# Patient Record
Sex: Female | Born: 1935 | Race: Black or African American | Hispanic: No | Smoking: Never smoker
Health system: Southern US, Community
[De-identification: ages and names within clinical notes are randomized; demographics above are authoritative.]

## PROBLEM LIST (undated history)

## (undated) DIAGNOSIS — M1990 Unspecified osteoarthritis, unspecified site: Secondary | ICD-10-CM

## (undated) DIAGNOSIS — M8580 Other specified disorders of bone density and structure, unspecified site: Secondary | ICD-10-CM

## (undated) DIAGNOSIS — Z860100 Personal history of colon polyps, unspecified: Secondary | ICD-10-CM

## (undated) DIAGNOSIS — H5440 Blindness, one eye, unspecified eye: Secondary | ICD-10-CM

## (undated) DIAGNOSIS — N3281 Overactive bladder: Secondary | ICD-10-CM

## (undated) DIAGNOSIS — I872 Venous insufficiency (chronic) (peripheral): Secondary | ICD-10-CM

## (undated) DIAGNOSIS — K449 Diaphragmatic hernia without obstruction or gangrene: Secondary | ICD-10-CM

## (undated) DIAGNOSIS — R0789 Other chest pain: Secondary | ICD-10-CM

## (undated) DIAGNOSIS — L309 Dermatitis, unspecified: Secondary | ICD-10-CM

## (undated) DIAGNOSIS — K589 Irritable bowel syndrome without diarrhea: Secondary | ICD-10-CM

## (undated) DIAGNOSIS — R51 Headache: Secondary | ICD-10-CM

## (undated) DIAGNOSIS — F419 Anxiety disorder, unspecified: Secondary | ICD-10-CM

## (undated) HISTORY — DX: Venous insufficiency (chronic) (peripheral): I87.2

## (undated) HISTORY — DX: Irritable bowel syndrome without diarrhea: K58.9

## (undated) HISTORY — DX: Personal history of colonic polyps: Z86.010

## (undated) HISTORY — DX: Blindness, one eye, unspecified eye: H54.40

## (undated) HISTORY — DX: Dermatitis, unspecified: L30.9

## (undated) HISTORY — DX: Diaphragmatic hernia without obstruction or gangrene: K44.9

## (undated) HISTORY — DX: Anxiety disorder, unspecified: F41.9

## (undated) HISTORY — DX: Other specified disorders of bone density and structure, unspecified site: M85.80

## (undated) HISTORY — DX: Other chest pain: R07.89

## (undated) HISTORY — PX: EYE SURGERY: SHX253

## (undated) HISTORY — DX: Headache: R51

## (undated) HISTORY — PX: VAGINAL HYSTERECTOMY: SUR661

## (undated) HISTORY — DX: Unspecified osteoarthritis, unspecified site: M19.90

## (undated) HISTORY — DX: Overactive bladder: N32.81

## (undated) HISTORY — DX: Personal history of colon polyps, unspecified: Z86.0100

---

## 1998-04-05 ENCOUNTER — Other Ambulatory Visit: Admission: RE | Admit: 1998-04-05 | Discharge: 1998-04-05 | Payer: Self-pay | Admitting: Obstetrics & Gynecology

## 1999-10-17 ENCOUNTER — Encounter: Admission: RE | Admit: 1999-10-17 | Discharge: 1999-10-17 | Payer: Self-pay | Admitting: Pulmonary Disease

## 1999-10-17 ENCOUNTER — Encounter: Payer: Self-pay | Admitting: Pulmonary Disease

## 2000-01-09 ENCOUNTER — Ambulatory Visit (HOSPITAL_COMMUNITY): Admission: RE | Admit: 2000-01-09 | Discharge: 2000-01-09 | Payer: Self-pay | Admitting: Pulmonary Disease

## 2000-01-09 ENCOUNTER — Encounter: Payer: Self-pay | Admitting: Pulmonary Disease

## 2000-04-15 ENCOUNTER — Other Ambulatory Visit: Admission: RE | Admit: 2000-04-15 | Discharge: 2000-04-15 | Payer: Self-pay | Admitting: Obstetrics & Gynecology

## 2000-08-24 ENCOUNTER — Encounter: Payer: Self-pay | Admitting: Internal Medicine

## 2000-08-24 ENCOUNTER — Encounter: Payer: Self-pay | Admitting: Emergency Medicine

## 2000-08-24 ENCOUNTER — Emergency Department (HOSPITAL_COMMUNITY): Admission: EM | Admit: 2000-08-24 | Discharge: 2000-08-24 | Payer: Self-pay | Admitting: *Deleted

## 2001-01-06 ENCOUNTER — Encounter: Payer: Self-pay | Admitting: Pulmonary Disease

## 2001-01-06 ENCOUNTER — Encounter: Admission: RE | Admit: 2001-01-06 | Discharge: 2001-01-06 | Payer: Self-pay | Admitting: Pulmonary Disease

## 2002-04-01 ENCOUNTER — Encounter: Admission: RE | Admit: 2002-04-01 | Discharge: 2002-04-01 | Payer: Self-pay | Admitting: Pulmonary Disease

## 2002-04-01 ENCOUNTER — Encounter: Payer: Self-pay | Admitting: Pulmonary Disease

## 2003-04-05 ENCOUNTER — Encounter: Payer: Self-pay | Admitting: Pulmonary Disease

## 2003-04-05 ENCOUNTER — Encounter: Admission: RE | Admit: 2003-04-05 | Discharge: 2003-04-05 | Payer: Self-pay | Admitting: Pulmonary Disease

## 2003-11-26 ENCOUNTER — Other Ambulatory Visit: Admission: RE | Admit: 2003-11-26 | Discharge: 2003-11-26 | Payer: Self-pay | Admitting: *Deleted

## 2004-06-16 ENCOUNTER — Encounter: Admission: RE | Admit: 2004-06-16 | Discharge: 2004-06-16 | Payer: Self-pay | Admitting: Pulmonary Disease

## 2004-06-23 ENCOUNTER — Encounter: Admission: RE | Admit: 2004-06-23 | Discharge: 2004-06-23 | Payer: Self-pay | Admitting: Pulmonary Disease

## 2004-07-03 ENCOUNTER — Ambulatory Visit: Payer: Self-pay | Admitting: Pulmonary Disease

## 2004-08-07 ENCOUNTER — Ambulatory Visit: Payer: Self-pay | Admitting: Pulmonary Disease

## 2004-08-11 ENCOUNTER — Ambulatory Visit: Payer: Self-pay | Admitting: Internal Medicine

## 2004-11-23 ENCOUNTER — Emergency Department (HOSPITAL_COMMUNITY): Admission: EM | Admit: 2004-11-23 | Discharge: 2004-11-23 | Payer: Self-pay | Admitting: Family Medicine

## 2004-11-27 ENCOUNTER — Ambulatory Visit: Payer: Self-pay | Admitting: Pulmonary Disease

## 2004-11-27 ENCOUNTER — Ambulatory Visit (HOSPITAL_COMMUNITY): Admission: RE | Admit: 2004-11-27 | Discharge: 2004-11-27 | Payer: Self-pay | Admitting: Pulmonary Disease

## 2005-01-08 ENCOUNTER — Ambulatory Visit: Payer: Self-pay | Admitting: Pulmonary Disease

## 2005-02-06 ENCOUNTER — Ambulatory Visit: Payer: Self-pay | Admitting: Pulmonary Disease

## 2005-02-07 ENCOUNTER — Ambulatory Visit: Payer: Self-pay | Admitting: Pulmonary Disease

## 2005-05-07 ENCOUNTER — Ambulatory Visit: Payer: Self-pay | Admitting: Pulmonary Disease

## 2005-05-18 ENCOUNTER — Ambulatory Visit: Payer: Self-pay | Admitting: Pulmonary Disease

## 2005-08-20 ENCOUNTER — Encounter: Admission: RE | Admit: 2005-08-20 | Discharge: 2005-08-20 | Payer: Self-pay | Admitting: Pulmonary Disease

## 2005-09-03 ENCOUNTER — Ambulatory Visit: Payer: Self-pay | Admitting: Pulmonary Disease

## 2005-09-07 ENCOUNTER — Encounter: Admission: RE | Admit: 2005-09-07 | Discharge: 2005-09-07 | Payer: Self-pay | Admitting: Pulmonary Disease

## 2005-11-20 ENCOUNTER — Ambulatory Visit: Payer: Self-pay | Admitting: Pulmonary Disease

## 2006-03-20 ENCOUNTER — Ambulatory Visit: Payer: Self-pay | Admitting: Pulmonary Disease

## 2006-03-26 ENCOUNTER — Ambulatory Visit: Payer: Self-pay | Admitting: Pulmonary Disease

## 2006-05-01 ENCOUNTER — Ambulatory Visit: Payer: Self-pay | Admitting: Pulmonary Disease

## 2006-07-22 ENCOUNTER — Ambulatory Visit: Payer: Self-pay | Admitting: Pulmonary Disease

## 2006-07-25 ENCOUNTER — Ambulatory Visit: Payer: Self-pay | Admitting: Pulmonary Disease

## 2006-07-25 LAB — CONVERTED CEMR LAB
ALT: 18 units/L (ref 0–40)
Albumin: 3.5 g/dL (ref 3.5–5.2)
BUN: 10 mg/dL (ref 6–23)
Basophils Relative: 0.5 % (ref 0.0–1.0)
Calcium: 9.5 mg/dL (ref 8.4–10.5)
Chloride: 101 meq/L (ref 96–112)
Eosinophil percent: 1.2 % (ref 0.0–5.0)
GFR calc non Af Amer: 75 mL/min
Hemoglobin: 13.4 g/dL (ref 12.0–15.0)
Lymphocytes Relative: 42.7 % (ref 12.0–46.0)
MCHC: 33.5 g/dL (ref 30.0–36.0)
MCV: 90.6 fL (ref 78.0–100.0)
Monocytes Relative: 5.6 % (ref 3.0–11.0)
Platelets: 302 10*3/uL (ref 150–400)
Potassium: 3.7 meq/L (ref 3.5–5.1)
RBC: 4.43 M/uL (ref 3.87–5.11)
WBC: 6.3 10*3/uL (ref 4.5–10.5)

## 2006-08-22 ENCOUNTER — Encounter: Admission: RE | Admit: 2006-08-22 | Discharge: 2006-08-22 | Payer: Self-pay | Admitting: Pulmonary Disease

## 2006-09-30 ENCOUNTER — Ambulatory Visit: Payer: Self-pay | Admitting: Pulmonary Disease

## 2006-11-20 ENCOUNTER — Ambulatory Visit: Payer: Self-pay | Admitting: Pulmonary Disease

## 2006-11-20 LAB — CONVERTED CEMR LAB
BUN: 11 mg/dL (ref 6–23)
Calcium: 9.6 mg/dL (ref 8.4–10.5)
Chloride: 105 meq/L (ref 96–112)
GFR calc non Af Amer: 105 mL/min
Sodium: 142 meq/L (ref 135–145)

## 2006-11-27 ENCOUNTER — Ambulatory Visit: Payer: Self-pay | Admitting: Internal Medicine

## 2007-02-20 ENCOUNTER — Ambulatory Visit: Payer: Self-pay | Admitting: Pulmonary Disease

## 2007-02-27 ENCOUNTER — Ambulatory Visit: Payer: Self-pay | Admitting: Pulmonary Disease

## 2007-02-27 LAB — CONVERTED CEMR LAB
CO2: 30 meq/L (ref 19–32)
Chloride: 105 meq/L (ref 96–112)
Cholesterol: 147 mg/dL (ref 0–200)
Creatinine, Ser: 0.7 mg/dL (ref 0.4–1.2)
GFR calc Af Amer: 106 mL/min
Glucose, Bld: 150 mg/dL — ABNORMAL HIGH (ref 70–99)
HDL: 34.8 mg/dL — ABNORMAL LOW (ref >39.0)
Potassium: 3.9 meq/L (ref 3.5–5.1)
Sodium: 140 meq/L (ref 135–145)
Triglycerides: 102 mg/dL (ref 0–149)
VLDL: 20 mg/dL (ref 0–40)

## 2007-05-05 ENCOUNTER — Ambulatory Visit: Payer: Self-pay | Admitting: Pulmonary Disease

## 2007-06-19 DIAGNOSIS — K589 Irritable bowel syndrome without diarrhea: Secondary | ICD-10-CM

## 2007-06-19 DIAGNOSIS — I872 Venous insufficiency (chronic) (peripheral): Secondary | ICD-10-CM | POA: Insufficient documentation

## 2007-06-19 DIAGNOSIS — E118 Type 2 diabetes mellitus with unspecified complications: Secondary | ICD-10-CM

## 2007-06-19 DIAGNOSIS — M1990 Unspecified osteoarthritis, unspecified site: Secondary | ICD-10-CM | POA: Insufficient documentation

## 2007-06-19 DIAGNOSIS — R51 Headache: Secondary | ICD-10-CM

## 2007-06-19 DIAGNOSIS — F411 Generalized anxiety disorder: Secondary | ICD-10-CM

## 2007-06-19 DIAGNOSIS — M545 Low back pain: Secondary | ICD-10-CM

## 2007-06-19 DIAGNOSIS — H5440 Blindness, one eye, unspecified eye: Secondary | ICD-10-CM

## 2007-06-19 DIAGNOSIS — E1169 Type 2 diabetes mellitus with other specified complication: Secondary | ICD-10-CM

## 2007-06-19 DIAGNOSIS — E785 Hyperlipidemia, unspecified: Secondary | ICD-10-CM

## 2007-06-19 DIAGNOSIS — R519 Headache, unspecified: Secondary | ICD-10-CM | POA: Insufficient documentation

## 2007-06-19 DIAGNOSIS — K449 Diaphragmatic hernia without obstruction or gangrene: Secondary | ICD-10-CM | POA: Insufficient documentation

## 2007-06-19 DIAGNOSIS — D126 Benign neoplasm of colon, unspecified: Secondary | ICD-10-CM

## 2007-06-19 HISTORY — DX: Irritable bowel syndrome, unspecified: K58.9

## 2007-06-19 HISTORY — DX: Type 2 diabetes mellitus with unspecified complications: E11.8

## 2007-06-19 HISTORY — DX: Generalized anxiety disorder: F41.1

## 2007-06-19 HISTORY — DX: Blindness, one eye, unspecified eye: H54.40

## 2007-06-19 HISTORY — DX: Type 2 diabetes mellitus with other specified complication: E11.69

## 2007-06-20 ENCOUNTER — Ambulatory Visit: Payer: Self-pay | Admitting: Pulmonary Disease

## 2007-06-20 LAB — CONVERTED CEMR LAB
CO2: 33 meq/L — ABNORMAL HIGH (ref 19–32)
Creatinine, Ser: 0.7 mg/dL (ref 0.4–1.2)
GFR calc Af Amer: 106 mL/min
GFR calc non Af Amer: 88 mL/min
Glucose, Bld: 101 mg/dL — ABNORMAL HIGH (ref 70–99)
Potassium: 3.7 meq/L (ref 3.5–5.1)
Sodium: 140 meq/L (ref 135–145)

## 2007-09-11 ENCOUNTER — Encounter: Admission: RE | Admit: 2007-09-11 | Discharge: 2007-09-11 | Payer: Self-pay | Admitting: Pulmonary Disease

## 2007-10-12 ENCOUNTER — Encounter: Payer: Self-pay | Admitting: Pulmonary Disease

## 2007-10-20 ENCOUNTER — Ambulatory Visit: Payer: Self-pay | Admitting: Pulmonary Disease

## 2007-10-25 DIAGNOSIS — M899 Disorder of bone, unspecified: Secondary | ICD-10-CM | POA: Insufficient documentation

## 2007-10-25 DIAGNOSIS — M949 Disorder of cartilage, unspecified: Secondary | ICD-10-CM

## 2008-01-15 ENCOUNTER — Telehealth: Payer: Self-pay | Admitting: Pulmonary Disease

## 2008-02-16 ENCOUNTER — Ambulatory Visit: Payer: Self-pay | Admitting: Internal Medicine

## 2008-02-17 DIAGNOSIS — L259 Unspecified contact dermatitis, unspecified cause: Secondary | ICD-10-CM

## 2008-02-19 ENCOUNTER — Telehealth: Payer: Self-pay | Admitting: Adult Health

## 2008-02-20 ENCOUNTER — Encounter: Payer: Self-pay | Admitting: Pulmonary Disease

## 2008-02-20 ENCOUNTER — Telehealth (INDEPENDENT_AMBULATORY_CARE_PROVIDER_SITE_OTHER): Payer: Self-pay | Admitting: *Deleted

## 2008-02-24 ENCOUNTER — Ambulatory Visit: Payer: Self-pay | Admitting: Pulmonary Disease

## 2008-02-26 ENCOUNTER — Ambulatory Visit: Payer: Self-pay | Admitting: Pulmonary Disease

## 2008-03-04 ENCOUNTER — Telehealth: Payer: Self-pay | Admitting: Pulmonary Disease

## 2008-03-04 LAB — CONVERTED CEMR LAB
ALT: 25 units/L (ref 0–35)
BUN: 14 mg/dL (ref 6–23)
Basophils Absolute: 0.1 10*3/uL (ref 0.0–0.1)
Basophils Relative: 1 % (ref 0.0–3.0)
Bilirubin, Direct: 0.1 mg/dL (ref 0.0–0.3)
Creatinine, Ser: 0.8 mg/dL (ref 0.4–1.2)
GFR calc non Af Amer: 75 mL/min
HCT: 39.8 % (ref 36.0–46.0)
HDL: 28.6 mg/dL — ABNORMAL LOW (ref >39.0)
Hemoglobin: 13.5 g/dL (ref 12.0–15.0)
Lymphocytes Relative: 37.3 % (ref 12.0–46.0)
MCHC: 34 g/dL (ref 30.0–36.0)
Monocytes Absolute: 0.5 10*3/uL (ref 0.1–1.0)
Neutro Abs: 3 10*3/uL (ref 1.4–7.7)
Potassium: 4 meq/L (ref 3.5–5.1)
RBC: 4.38 M/uL (ref 3.87–5.11)
RDW: 12.5 % (ref 11.5–14.6)
Total Bilirubin: 0.6 mg/dL (ref 0.3–1.2)
Total CHOL/HDL Ratio: 5.7
Total Protein: 7 g/dL (ref 6.0–8.3)
VLDL: 26 mg/dL (ref 0–40)

## 2008-04-21 ENCOUNTER — Ambulatory Visit: Payer: Self-pay | Admitting: Pulmonary Disease

## 2008-05-31 ENCOUNTER — Telehealth: Payer: Self-pay | Admitting: Pulmonary Disease

## 2008-08-26 ENCOUNTER — Ambulatory Visit: Payer: Self-pay | Admitting: Pulmonary Disease

## 2008-08-26 DIAGNOSIS — R0789 Other chest pain: Secondary | ICD-10-CM | POA: Insufficient documentation

## 2008-08-26 LAB — CONVERTED CEMR LAB
BUN: 12 mg/dL (ref 6–23)
Calcium: 10.9 mg/dL — ABNORMAL HIGH (ref 8.4–10.5)
Chloride: 95 meq/L — ABNORMAL LOW (ref 96–112)
Creatinine, Ser: 0.6 mg/dL (ref 0.4–1.2)
Hgb A1c MFr Bld: 7 % — ABNORMAL HIGH (ref 4.6–6.0)
Potassium: 4.3 meq/L (ref 3.5–5.1)

## 2008-08-31 ENCOUNTER — Telehealth: Payer: Self-pay | Admitting: Pulmonary Disease

## 2008-09-24 ENCOUNTER — Encounter: Admission: RE | Admit: 2008-09-24 | Discharge: 2008-09-24 | Payer: Self-pay | Admitting: Pulmonary Disease

## 2008-10-04 ENCOUNTER — Encounter: Admission: RE | Admit: 2008-10-04 | Discharge: 2008-10-04 | Payer: Self-pay | Admitting: Pulmonary Disease

## 2008-10-25 ENCOUNTER — Emergency Department (HOSPITAL_COMMUNITY): Admission: EM | Admit: 2008-10-25 | Discharge: 2008-10-25 | Payer: Self-pay | Admitting: Family Medicine

## 2008-10-25 ENCOUNTER — Telehealth (INDEPENDENT_AMBULATORY_CARE_PROVIDER_SITE_OTHER): Payer: Self-pay | Admitting: *Deleted

## 2008-10-26 ENCOUNTER — Ambulatory Visit: Payer: Self-pay | Admitting: Internal Medicine

## 2008-10-26 DIAGNOSIS — K5289 Other specified noninfective gastroenteritis and colitis: Secondary | ICD-10-CM | POA: Insufficient documentation

## 2008-12-10 ENCOUNTER — Encounter (INDEPENDENT_AMBULATORY_CARE_PROVIDER_SITE_OTHER): Payer: Self-pay | Admitting: *Deleted

## 2008-12-30 ENCOUNTER — Telehealth (INDEPENDENT_AMBULATORY_CARE_PROVIDER_SITE_OTHER): Payer: Self-pay | Admitting: *Deleted

## 2009-02-22 ENCOUNTER — Ambulatory Visit: Payer: Self-pay | Admitting: Pulmonary Disease

## 2009-02-23 LAB — CONVERTED CEMR LAB
AST: 26 units/L (ref 0–37)
Alkaline Phosphatase: 80 units/L (ref 39–117)
Basophils Absolute: 0 10*3/uL (ref 0.0–0.1)
Bilirubin, Direct: 0.1 mg/dL (ref 0.0–0.3)
CO2: 34 meq/L — ABNORMAL HIGH (ref 19–32)
Calcium: 10.1 mg/dL (ref 8.4–10.5)
Chloride: 101 meq/L (ref 96–112)
Direct LDL: 145.9 mg/dL
Eosinophils Absolute: 0.1 10*3/uL (ref 0.0–0.7)
Glucose, Bld: 127 mg/dL — ABNORMAL HIGH (ref 70–99)
HDL: 40.5 mg/dL (ref >39.00)
Hemoglobin: 14.6 g/dL (ref 12.0–15.0)
Hgb A1c MFr Bld: 7 % — ABNORMAL HIGH (ref 4.6–6.5)
Lymphocytes Relative: 41.2 % (ref 12.0–46.0)
MCHC: 33.8 g/dL (ref 30.0–36.0)
MCV: 91.1 fL (ref 78.0–100.0)
Monocytes Absolute: 0.4 10*3/uL (ref 0.1–1.0)
Neutrophils Relative %: 51.6 % (ref 43.0–77.0)
Platelets: 258 10*3/uL (ref 150.0–400.0)
Potassium: 4 meq/L (ref 3.5–5.1)
RDW: 12.7 % (ref 11.5–14.6)
Total CHOL/HDL Ratio: 5
VLDL: 37.4 mg/dL (ref 0.0–40.0)

## 2009-04-13 ENCOUNTER — Ambulatory Visit: Payer: Self-pay | Admitting: Pulmonary Disease

## 2009-05-16 ENCOUNTER — Telehealth (INDEPENDENT_AMBULATORY_CARE_PROVIDER_SITE_OTHER): Payer: Self-pay | Admitting: *Deleted

## 2009-08-01 ENCOUNTER — Telehealth: Payer: Self-pay | Admitting: Pulmonary Disease

## 2009-08-03 ENCOUNTER — Ambulatory Visit: Payer: Self-pay | Admitting: Pulmonary Disease

## 2009-08-09 ENCOUNTER — Telehealth: Payer: Self-pay | Admitting: Internal Medicine

## 2009-08-09 ENCOUNTER — Telehealth (INDEPENDENT_AMBULATORY_CARE_PROVIDER_SITE_OTHER): Payer: Self-pay | Admitting: *Deleted

## 2009-08-23 ENCOUNTER — Telehealth (INDEPENDENT_AMBULATORY_CARE_PROVIDER_SITE_OTHER): Payer: Self-pay | Admitting: *Deleted

## 2009-09-07 ENCOUNTER — Encounter: Payer: Self-pay | Admitting: Pulmonary Disease

## 2009-09-23 ENCOUNTER — Telehealth: Payer: Self-pay | Admitting: Pulmonary Disease

## 2009-10-11 ENCOUNTER — Encounter: Admission: RE | Admit: 2009-10-11 | Discharge: 2009-10-11 | Payer: Self-pay | Admitting: Pulmonary Disease

## 2009-10-19 ENCOUNTER — Encounter: Admission: RE | Admit: 2009-10-19 | Discharge: 2009-10-19 | Payer: Self-pay | Admitting: Pulmonary Disease

## 2009-10-26 ENCOUNTER — Encounter: Payer: Self-pay | Admitting: Pulmonary Disease

## 2010-02-13 ENCOUNTER — Ambulatory Visit: Payer: Self-pay | Admitting: Pulmonary Disease

## 2010-02-13 DIAGNOSIS — N318 Other neuromuscular dysfunction of bladder: Secondary | ICD-10-CM

## 2010-02-13 HISTORY — DX: Other neuromuscular dysfunction of bladder: N31.8

## 2010-02-20 ENCOUNTER — Ambulatory Visit: Payer: Self-pay | Admitting: Pulmonary Disease

## 2010-02-21 LAB — CONVERTED CEMR LAB
ALT: 20 units/L (ref 0–35)
AST: 21 units/L (ref 0–37)
BUN: 16 mg/dL (ref 6–23)
Basophils Relative: 0.4 % (ref 0.0–3.0)
Chloride: 103 meq/L (ref 96–112)
Eosinophils Relative: 1.7 % (ref 0.0–5.0)
Hgb A1c MFr Bld: 7.2 % — ABNORMAL HIGH (ref 4.6–6.5)
LDL Cholesterol: 118 mg/dL — ABNORMAL HIGH (ref 0–99)
Lymphocytes Relative: 53.1 % — ABNORMAL HIGH (ref 12.0–46.0)
Lymphs Abs: 2.7 10*3/uL (ref 0.7–4.0)
MCHC: 34.3 g/dL (ref 30.0–36.0)
Platelets: 220 10*3/uL (ref 150.0–400.0)
Potassium: 4.4 meq/L (ref 3.5–5.1)
RBC: 4.41 M/uL (ref 3.87–5.11)
RDW: 13.6 % (ref 11.5–14.6)
Sodium: 141 meq/L (ref 135–145)
Total Bilirubin: 0.5 mg/dL (ref 0.3–1.2)
Total CHOL/HDL Ratio: 5
Total Protein: 7 g/dL (ref 6.0–8.3)
VLDL: 31 mg/dL (ref 0.0–40.0)
WBC: 5.1 10*3/uL (ref 4.5–10.5)

## 2010-04-04 ENCOUNTER — Encounter: Payer: Self-pay | Admitting: Pulmonary Disease

## 2010-04-06 ENCOUNTER — Encounter: Admission: RE | Admit: 2010-04-06 | Discharge: 2010-04-06 | Payer: Self-pay | Admitting: Obstetrics and Gynecology

## 2010-04-11 ENCOUNTER — Ambulatory Visit: Payer: Self-pay | Admitting: Pulmonary Disease

## 2010-05-31 ENCOUNTER — Encounter: Payer: Self-pay | Admitting: Pulmonary Disease

## 2010-05-31 ENCOUNTER — Telehealth: Payer: Self-pay | Admitting: Pulmonary Disease

## 2010-07-07 ENCOUNTER — Inpatient Hospital Stay (HOSPITAL_COMMUNITY)
Admission: RE | Admit: 2010-07-07 | Discharge: 2010-07-08 | Payer: Self-pay | Source: Home / Self Care | Attending: Obstetrics & Gynecology | Admitting: Obstetrics & Gynecology

## 2010-07-07 ENCOUNTER — Encounter (INDEPENDENT_AMBULATORY_CARE_PROVIDER_SITE_OTHER): Payer: Self-pay | Admitting: Obstetrics & Gynecology

## 2010-08-05 ENCOUNTER — Encounter: Payer: Self-pay | Admitting: Pulmonary Disease

## 2010-08-06 ENCOUNTER — Encounter: Payer: Self-pay | Admitting: Pulmonary Disease

## 2010-08-15 ENCOUNTER — Telehealth (INDEPENDENT_AMBULATORY_CARE_PROVIDER_SITE_OTHER): Payer: Self-pay | Admitting: *Deleted

## 2010-08-15 DIAGNOSIS — I10 Essential (primary) hypertension: Secondary | ICD-10-CM | POA: Insufficient documentation

## 2010-08-15 HISTORY — DX: Essential (primary) hypertension: I10

## 2010-08-15 NOTE — Progress Notes (Signed)
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 says that [facility] is closed until 3/27 and she needs some now. also wants to know why she had to change to [facility] when she used to use [facility] [address]. anyway she needs 4 boxes of test strips. she just called [facility] [address] and they have these on hand now. pt has medicare AND [employer].  Initial call taken by: Tivis Ringer, CNA,  September 23, 2009 11:19 AM  Follow-up for Phone Call        Please advise. Zackery Barefoot CMA  September 23, 2009 12:25 PM    this is ok to send into her pharmacy---they might have changed to [facility] due to her insurance but i am not really sure of that. Randell Loop [facility]  September 23, 2009 12:27 PM   Additional Follow-up for Phone Call Additional follow up Details #1::        Called and spoke with pharmacy and advised each box has 50 test strips in same. Will need to know how often patient is testing in order to call in same. Also stated they have only billed to [facility] and not Medicare. Please advise how often pt is to test. Zackery Barefoot CMA  September 23, 2009 12:38 PM     Additional Follow-up for Phone Call Additional follow up Details #2::    pt should test once daily.  thanks Randell Loop CMA  September 23, 2009 1:25 PM    called and spoke with pt and she stated that she is no longer using the mail order place for her test strips and wants to get them from the local pharmacy---she test two times a day so i have sent in for her to get 2 boxes of test strips with refills.  she will call for any problems Randell Loop CMA  September 23, 2009 1:56 PM   New/Updated Medications: PRODIGY BLOOD GLUCOSE TEST  STRP (GLUCOSE BLOOD) test blood sugar two times a day Prescriptions: PRODIGY BLOOD GLUCOSE TEST  STRP (GLUCOSE BLOOD) test blood sugar two times a day  #100 x  2   Entered by:   Randell Loop CMA   Authorized by:   Michele Mcalpine MD   Signed by:   Randell Loop CMA on 09/23/2009   Method used:   Electronically to        [id number] (retail)       [address]       [place], [place]  [id number]       Ph: [phone]       Fax: [phone]   RxID:   [id number]

## 2010-08-15 NOTE — Progress Notes (Signed)
Summary: Schedule recall colon   Phone Note Outgoing Call Call back at [facility] Phone [phone]   Call placed by: Christie Nottingham CMA Duncan Dull),  August 09, 2009 2:35 PM Call placed to: Patient Summary of Call: Called pt to schedule recall colon but number is busy. Initial call taken by: Christie Nottingham CMA Duncan Dull),  August 09, 2009 2:35 PM  Follow-up for Phone Call        Pt states she does not want to schedule at this time and she will call back. I informed pt of the importance of scheduling this procedure due to her history of polyps. Pt agreed and verbalized understanding.  Follow-up by: Christie Nottingham CMA Duncan Dull),  August 19, 2009 10:44 AM

## 2010-08-15 NOTE — Progress Notes (Signed)
Summary: surgery clearance  Phone Note Call from Patient Call back at Home Phone [phone]   Caller: Patient Call For: Kimberly Aguirre Summary of Call: pt wants to confirm that surgery clearance info (from dr Rosalio Macadamia) had been received by dr Kriste Basque. pt's hysterectomy is scheduled for 12/23.  Initial call taken by: Tivis Ringer, CNA,  May 31, 2010 2:41 PM  Follow-up for Phone Call        Spoke with Kimberly Aguirre; she hasnt seen these papers. I called Dr. Particia Jasper office to have them fax to us-will give to [facility] for SN to look at.Reynaldo Minium CMA  May 31, 2010 3:27 PM   Additional Follow-up for Phone Call Additional follow up Details #1::        the paper has been received and reviewed by SN----form and ok for surgery clearance has been faxed back to Dr. Buddy Duty office--per SN--pt will need cxr and ekg done prior to surgery. Randell Loop [facility]  June 01, 2010 9:46 AM

## 2010-08-15 NOTE — Progress Notes (Signed)
Summary: talk to nurse  Phone Note Call from Patient   Caller: Patient Call For: nadel Summary of Call: pt want to know if she should continue taking mucinex . not coughing but still have nasal drainage Initial call taken by: Rickard Patience,  August 23, 2009 4:10 PM  Follow-up for Phone Call        called and spoke with pt.  informed pt mucinex is for chest congestion and coughing up sputum which pt states she has not had any of these symptoms.  Pt only c/o mild nasal drainage.  pt states she doesn't have HBP so I recommended she can take an OTC antihistamine to help with that.  pt verbalized understanding and denied any questions. Arman Filter LPN  August 23, 2009 4:29 PM

## 2010-08-15 NOTE — Letter (Signed)
Summary: [facility]  [facility]   Imported By: Lester  09/23/2009 09:33:16  _____________________________________________________________________  External Attachment:    Type:   Image     Comment:   External Document

## 2010-08-15 NOTE — Assessment & Plan Note (Signed)
Summary: pain and numbness in left leg//jrc   CC:  5 month ROV & review of mult medical problems....  History of Present Illness: 77 y/o BF here for a follow up visit...  he has multiple medical problems as noted below...    ~  August 26, 2008:  here for a 6 month follow up- notes occas atypical right sided CP's... sharp, right chest, noticed at night & not days, 2-4/10 severity, no assoc n/v/diaphoresis, resolves spont in a few minutes... last EKG was 2003= NSR, WNL.[name]. had NuclearStressTest Dec00 which was neg- no infarct or ischemia, EF= 77%... no known coronary dis, but mult risk factors= DM, Chol, +FamHx strokes... [facility] & walks [facility]... we discussed checking EKG & Rx w/ rest, heat, Aleve, Tylenol...   ~  February 22, 2009:  she's had a good bit of stress (mult deaths in the family) w/ incr BP and erratic BS's (170-190 at home).. she's noted some neuropathy symptoms but doesn't want additional meds yet... she notes decr energy, feeling drained, but trying to maintain exercise...   ~  August 03, 2009:  c/o discomfort in left leg- prev burning in feet, but this has resolved & now a pain in groin area assoc w/ stabbing pain in thigh area... only prev hip films was pelvis XRay- neg in 2006... monofilament test = norm, sl decr ROM hip & prev right hip injection by DrGioffre in 2006>> we discussed Mobic, refer to Ortho for XRay & poss injection.    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  VENOUS INSUFFICIENCY (ICD-459.81) - follows low sodium diet, elevates legs, support hose... no swelling.  HYPERCHOLESTEROLEMIA (ICD-272.0) - on FISH OIL daily, she stopped her prev Simvastatin 20mg  on her own... refuses statin therapy due to pain and swelling in her ankles, and she just wants Diet Rx alone... due for f/u labs.  ~  FLP 8/08 showed TChol 147, TG 102, HDL 35, LDL 92... ? on her simvastatin  20mg /d.  ~  FLP 8/09 showed TChol 163, TG 129, HDL 29, LDL 109... she prefers diet alone- discussed!  ~  FLP 8/10 showed TChol 211, Tg 187, HDL 41, LDL 146...offered Prav40 w/ hope fewer side effects.  ~  1/11: she reports trial Prav40 but stopped this due to "cramping in joints, esp ankles"...  DIABETES MELLITUS (ICD-250.00) - on METFORMIN 500mg - taking 1Bid & AMARYL 4mg /d...   ~  labs 12/08 showed BS=101, HgA1c=7.3.Marland KitchenMarland Kitchen rec to add Actos but pt didn't fill this Rx.  ~  labs 8/09 showed BS= 146, HgA1c= 7.4.Marland KitchenMarland Kitchen rec incr Metform 2Bid + Amaryl 4mg /d...  **note- pt states intol to Metform 2Bid and had to decr back to 1Bid + the Glimep...  ~  labs 2/10 showed BS= 53, A1c= 7.0.Marland KitchenMarland Kitchen rec same meds, better diet, may need decr Glimep.  ~  labs 8/10 showed BS= 127, A1c= 7.0.[name]. rec> keep same, better diet.  ~  1/11: she reports BS at home all in the 100-140+ range...  HIATAL HERNIA (ICD-553.3) - last EGD was 1980 showing 2cmHH, mild esophagitis and gastritis...  IRRITABLE BOWEL SYNDROME (ICD-564.1) - she uses Miralax, Senakot as needed...  COLONIC POLYPS (ICD-211.3) - last colonoscopy 7/05 by DrBrodie showed 2-32mm polyps... f/u planned 59yrs.  DEGENERATIVE JOINT DISEASE (ICD-715.90) - right hip DJD per DrGioffre w/ shot in 2006...  BACK PAIN, LUMBAR (ICD-724.2) - she prev had some leg pain at night, ?  neuropathy and tried Lyrica...  ~  1/11:  denies back pain etc...  OSTEOPENIA (ICD-733.90) - on OSCAL Bid + vits daily, she stopped FOSAMAX on her own... intol to bisphosphonates...   ~  BMD 5/08 showed TScores -1.1 to -1.5.[name]. & VitD level=64 in 8/08... rec to start Fosamax- pt refused.  HEADACHE (ICD-784.0)  ANXIETY (ICD-300.00) - she uses CHLORAZEPATE 7.5mg  Prn...  SCREENING STUDIES & HEALTH MAINT:   ~  NuclearStressTest 12/00 was neg for infarct or ischemia, EF=77%...  ~  Pneumovax shot 2003  ~  Tetanus shot 2004 w/ local inflamm reaction.  ~  GYN= DrDickstein    Allergies: 1)  ! Codeine 2)  !  * Oil of Olay  Comments:  Nurse/Medical Assistant: The patient's medications and allergies were reviewed with the patient and were updated in the Medication and Allergy Lists.  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 mother died at 16 from stroke and diabetes father died at 17 from stroke 4 brothers 5 sisters  Social History: Reviewed history from 02/24/2008 and no changes required. widowed 2 children never-smoker no Etoh  Review of Systems      See HPI       The patient complains of dyspnea on exertion and difficulty walking.  The patient denies anorexia, fever, weight loss, weight gain, vision loss, decreased hearing, hoarseness, chest pain, syncope, peripheral edema, prolonged cough, headaches, hemoptysis, abdominal pain, melena, hematochezia, severe indigestion/heartburn, hematuria, incontinence, muscle weakness, suspicious skin lesions, transient blindness, depression, unusual weight change, abnormal bleeding, enlarged lymph nodes, and angioedema.    Vital Signs:  Patient profile:   77 year old female Height:      61 inches Weight:      138 pounds BMI:     26.17 O2 Sat:      97 % on Room air Temp:     97.2 degrees F oral Pulse rate:   90 / minute BP sitting:   140 / 60  (left arm) Cuff size:   regular  Vitals Entered By: Randell Loop CMA (August 03, 2009 10:32 AM)  O2 Sat at Rest %:  97 O2 Flow:  Room air CC: 5 month ROV & review of mult medical  problems... Comments meds updated today   Physical Exam  Additional Exam:  WD, WN, 77 y/o BF in NAD... GENERAL:  Alert & oriented; pleasant & cooperative... HEENT:  [place]/AT, Ophthal per DrHecker, EACs-clear, TMs-wnl, NOSE-clear, THROAT-clear & wnl. NECK:  Supple w/ fairROM; no JVD; normal carotid impulses w/o bruits; no thyromegaly or nodules palpated; no lymphadenopathy. CHEST:  Clear to P & A; without wheezes/ rales/ or rhonchi heard... HEART:  Regular Rhythm; without murmurs/ rubs/ or gallops detected... ABDOMEN:  Soft & nontender; normal bowel sounds; no organomegaly or masses palpated, no guarding, or rebound. ... EXT: without deformities, mod arthritic changes; no varicose veins/ +venous insuffic/ no edema. some decr ROM left hip... NEURO:  CN's intact; motor testing normal; sensory testing normal & monofilament test= norm; gait normal & balance OK. DERM:  No lesions noted; no rash etc...     Impression & Recommendations:  Problem # 1:  DEGENERATIVE JOINT DISEASE (ICD-715.90) We  discussed MOBIC Rx and f/u Ortho... Her updated medication list for this problem includes:    Adult Aspirin Ec Low Strength 81 Mg Tbec (Aspirin) .[name]... Take 1 tablet by mouth once a day    Meloxicam 7.5 Mg Tabs (Meloxicam) .[name]... Take 1 tab by mouth ad as needed for arthritis pain...  Orders: Orthopedic Referral (Ortho)  Problem # 2:  HYPERCHOLESTEROLEMIA (ICD-272.0) She has proven INTOL to al-  she will follow strict Low chol/ low fat diet... NOW SHE STATES THAT SHE WANTS TO TRY THE PRAV $)MG?D AGAIN>>>  Her updated medication list for this problem includes:    Pravastatin Sodium 40 Mg Tabs (Pravastatin sodium) .[name]... Take one tablet by mouth at bedtime  Problem # 3:  DIABETES MELLITUS (ICD-250.00) Controlled-  same meds. Her updated medication list for this problem includes:    Adult Aspirin Ec Low Strength 81 Mg Tbec (Aspirin) .[name]... Take 1 tablet by mouth once a day    Metformin Hcl 500 Mg Tabs  (Metformin hcl) .[name]... Take one tablet by mouth two times a day    Amaryl 4 Mg Tabs (Glimepiride) .[name]... Take 1 tablet by mouth once a day in the am...  Problem # 4:  HIATAL HERNIA (ICD-553.3) GI is stable-  continue same Rx...  Problem # 5:  ANXIETY (ICD-300.00) Continue Tranxene- she states that it helps... Her updated medication list for this problem includes:    Clorazepate Dipotassium 7.5 Mg Tabs (Clorazepate dipotassium) .[name]... Take one tablet by mouth three times a day as needed for nerves  Problem # 6:  OTHER MEDICAL PROBLEMS AS NOTED>>>  Complete Medication List: 1)  Adult Aspirin Ec Low Strength 81 Mg Tbec (Aspirin) .... Take 1 tablet by mouth once a day 2)  Metformin Hcl 500 Mg Tabs (Metformin hcl) .... Take one tablet by mouth two times a day 3)  Amaryl 4 Mg Tabs (Glimepiride) .... Take 1 tablet by mouth once a day in the am... 4)  Fish Oil 1000 Mg Caps (Omega-3 fatty acids) .... Take 1 cap daily.Marland KitchenMarland Kitchen 5)  Oscal 500/200 D-3 500-200 Mg-unit Tabs (Calcium-vitamin d) .[name].. 1 tab twice daily.Marland KitchenMarland Kitchen 6)  Centrum Silver Tabs (Multiple vitamins-minerals) .... Take 1 tablet by mouth once a day 7)  Clorazepate Dipotassium 7.5 Mg Tabs (Clorazepate dipotassium) .... Take one tablet by mouth three times a day as needed for nerves 8)  Betamethasone Dipropionate 0.05 % Oint (Betamethasone dipropionate) .... Apply as directed 9)  Meloxicam 7.5 Mg Tabs (Meloxicam) .... Take 1 tab by mouth ad as needed for arthritis pain... 10)  Pravastatin Sodium 40 Mg Tabs (Pravastatin sodium) .... Take one tablet by mouth at bedtime  Other Orders: Prescription Created Electronically [phone])  Patient Instructions: 1)  Today we updated your med list- see below.... 2)  we decided to start a once-a-day arthritis pill for your left hip pain... use the Meloxicam one tab daily as needed... 3)  We will arrange for an orthopedic eval... 4)  Continue your diet Rx & same meds for now... 5)  Call for any questions.Marland KitchenMarland Kitchen 6)   Please schedule a follow-up appointment in 4-6 months. Prescriptions: MELOXICAM 7.5 MG TABS (MELOXICAM) take 1 tab by mouth ad as needed for arthritis pain...  #30 x 6   Entered and Authorized by:   Michele Mcalpine MD   Signed by:   Michele Mcalpine MD on 08/03/2009   Method used:   Print then Give to Patient   RxID:   [id number]

## 2010-08-15 NOTE — Letter (Signed)
Summary: [facility]  [facility]   Imported By: Lennie Odor 04/12/2010 14:27:28  _____________________________________________________________________  External Attachment:    Type:   Image     Comment:   External Document

## 2010-08-15 NOTE — Assessment & Plan Note (Signed)
Summary: 6 months/apc   CC:  7 month ROV & review of mult medical problems....  History of Present Illness: 77 y/o BF here for a follow up visit...  he has multiple medical problems as noted below...    ~  August 26, 2008:  here for a 6 month follow up- notes occas atypical right sided CP's... sharp, right chest, noticed at night & not days, 2-4/10 severity, no assoc n/v/diaphoresis, resolves spont in a few minutes... last EKG was 2003= NSR, WNL.[name]. had NuclearStressTest Dec00 which was neg- no infarct or ischemia, EF= 77%... no known coronary dis, but mult risk factors= DM, Chol, +FamHx strokes... [facility] & [facility]... we discussed checking EKG & Rx w/ rest, heat, Aleve, Tylenol...   ~  February 22, 2009:  she's had a good bit of stress (mult deaths in the family) w/ incr BP and erratic BS's (170-190 at home).. she's noted some neuropathy symptoms but doesn't want additional meds yet... she notes decr energy, feeling drained, but trying to maintain exercise...   ~  August 03, 2009:  c/o discomfort in left leg- prev burning in feet, but this has resolved & now a pain in groin area assoc w/ stabbing pain in thigh area... only prev hip films was pelvis XRay- neg in 2006... monofilament test = norm, sl decr ROM hip & prev right hip injection by DrGioffre in 2006>> we discussed Mobic, refer to Ortho for XRay & poss injection.   ~  February 13, 2010:  she saw DrGioffre for Ortho- "I'm his mystery pt" state he couldn't find anything & Mobic helps some... c/o excess urination c/w overactive bladder & we discussed trial of Toviaz for these symptoms... she has uterine prolapse as well & may need surg as she declines pessary Rx from GYN... she saw DrHecker 2/11 w/ mild diabetic retinopathy bilat... needs to ret for FASTING labs...     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  VENOUS INSUFFICIENCY (ICD-459.81) - follows low sodium diet, elevates legs, support hose... no swelling.  HYPERCHOLESTEROLEMIA (ICD-272.0) - on FISH OIL daily, she stopped her prev Simvastatin 20mg  on her own... refuses statin therapy due to pain and swelling in her ankles, and she just wants Diet Rx alone... due for f/u labs.  ~  FLP 8/08 showed TChol 147, TG 102, HDL 35, LDL 92... ? on her simvastatin 20mg /d.  ~  FLP 8/09 showed TChol 163, TG 129, HDL 29, LDL 109... she prefers diet alone- discussed!  ~  FLP 8/10 showed TChol 211, Tg 187, HDL 41, LDL 146...offered Prav40 w/ hope fewer side effects.  ~  1/11: she reports trial Prav40 but stopped this due to "cramping in joints, esp ankles"...  ~  FLP 8/11 on diet alone showed TChol   DIABETES MELLITUS (ICD-250.00) - on METFORMIN 500mg - taking 1Bid & AMARYL 4mg /d...   ~  labs 12/08 showed BS=101, HgA1c=7.3.Marland KitchenMarland Kitchen rec to add Actos but pt didn't fill this Rx.  ~  labs 8/09 showed BS= 146, HgA1c= 7.4.Marland KitchenMarland Kitchen rec incr Metform 2Bid + Amaryl 4mg /d...  **note- pt states intol to Metform 2Bid and had to decr back to 1Bid + the Glimep...  ~  labs 2/10 showed BS= 53, A1c= 7.0.Marland KitchenMarland Kitchen rec same meds, better diet, may need decr Glimep.  ~  labs 8/10 showed BS=  127, A1c= 7.0.[name]. rec> keep same, better diet.  ~  1/11: she reports BS at home all in the 100-140+ range...  ~  labs 8/11 on Metfor500Bid+Glim4 showed BS=   HIATAL HERNIA (ICD-553.3) - last EGD was 1980 showing 2cmHH, mild esophagitis and gastritis...  IRRITABLE BOWEL SYNDROME (ICD-564.1) - she uses Miralax, Senakot as needed...  COLONIC POLYPS (ICD-211.3) - last colonoscopy 7/05 by DrBrodie showed 2-24mm polyps... f/u planned 76yrs.  OVERACTIVE BLADDER (ICD-596.51) - she notes symptoms c/w overactive bladder 8/11 & we discussed trial of Toviaz.  DEGENERATIVE JOINT DISEASE (ICD-715.90) - right hip DJD per DrGioffre w/ shot in 2006... repeat eval Spring2011 and  nothing found according to the pt "I'm his mystery pt" she says... takes MOBIC 7.5mg  Prn w/ some relief...  BACK PAIN, LUMBAR (ICD-724.2) - she prev had some leg pain at night, ?neuropathy and tried Lyrica...  ~  1/11:  denies back pain etc...  OSTEOPENIA (ICD-733.90) - on OSCAL Bid + vits daily, she stopped FOSAMAX on her own... intol to bisphosphonates...   ~  BMD 5/08 showed TScores -1.1 to -1.5.[name]. & VitD level=64 in 8/08... rec to start Fosamax- pt refused.  HEADACHE (ICD-784.0)  ANXIETY (ICD-300.00) - she uses CHLORAZEPATE 7.5mg  Prn...  SCREENING STUDIES & HEALTH MAINT:   ~  NuclearStressTest 12/00 was neg for infarct or ischemia, EF=77%...  ~  Pneumovax shot 2003  ~  Tetanus shot 2004 w/ local inflamm reaction.  ~  GYN= DrDickstein   Preventive Screening-Counseling & Management  Alcohol-Tobacco     Smoking Status: never  Allergies: 1)  ! Codeine 2)  ! * Oil of Olay 3)  ! * Statin Meds  Comments:  Nurse/Medical Assistant: The patient's medications and allergies were reviewed with the patient and were updated in the Medication and Allergy Lists.  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 mother died at 70 from stroke and diabetes father died at 8 from stroke 4 brothers 5 sisters  Social History: Reviewed history from 02/24/2008 and no changes required. widowed 2 children never-smoker no Etoh  Review of Systems      See  HPI       The patient complains of vision loss and dyspnea on exertion.  The patient denies anorexia, fever, weight loss, weight gain, decreased hearing, hoarseness, chest pain, syncope, peripheral edema, prolonged cough, headaches, hemoptysis, abdominal pain, melena, hematochezia, severe indigestion/heartburn, hematuria, incontinence, muscle weakness, suspicious skin lesions, transient blindness, difficulty walking, depression, unusual weight change, abnormal bleeding, enlarged lymph nodes, and angioedema.    Vital Signs:  Patient profile:   77 year old female Height:      61 inches Weight:      136.38 pounds BMI:     25.86 O2 Sat:      94 % on Room air Temp:     97.1 degrees F oral Pulse rate:   92 / minute BP sitting:   106 / 62  (right arm) Cuff size:   regular  Vitals Entered By: Randell Loop CMA (February 13, 2010 2:03 PM)  O2 Sat at Rest %:  94 O2 Flow:  Room air CC: 7 month ROV &  review of mult medical problems... Is Patient Diabetic? Yes Pain Assessment Patient in pain? yes      Comments meds updated today with pt Randell Loop CMA  February 13, 2010 2:14 PM    Physical Exam  Additional Exam:  WD, WN, 77 y/o BF in NAD... GENERAL:  Alert & oriented; pleasant & cooperative... HEENT:  [place]/AT, Ophthal per DrHecker, EACs-clear, TMs-wnl, NOSE-clear, THROAT-clear & wnl. NECK:  Supple w/ fairROM; no JVD; normal carotid impulses w/o bruits; no thyromegaly or nodules palpated; no lymphadenopathy. CHEST:  Clear to P & A; without wheezes/ rales/ or rhonchi heard... HEART:  Regular Rhythm; without murmurs/ rubs/ or gallops detected... ABDOMEN:  Soft & nontender; normal bowel sounds; no organomegaly or masses palpated, no guarding, or rebound. ... EXT: without deformities, mod arthritic changes; no varicose veins/ +venous insuffic/ no edema. some decr ROM left hip... NEURO:  CN's intact; motor testing normal; sensory testing normal & monofilament test= norm; gait normal & balance  OK. DERM:  No lesions noted; no rash etc...    MISC. Report  Procedure date:  02/13/2010  Findings:      Pt will ret for FASTING blood work & we will review and discuss any needed med changes with her...  SN   Impression & Recommendations:  Problem # 1:  HYPERCHOLESTEROLEMIA (ICD-272.0) She is to ret for FLP... for now continue the diet, exercise, FishOil regimen... The following medications were removed from the medication list:    Pravastatin Sodium 40 Mg Tabs (Pravastatin sodium) .[name]... Take one tablet by mouth at bedtime  Problem # 2:  DIABETES MELLITUS (ICD-250.00) On 2 meds as listed... due for f/u labs w/ A1c pending... discussed diet, exercise, etc... Her updated medication list for this problem includes:    Adult Aspirin Ec Low Strength 81 Mg Tbec (Aspirin) .[name]... Take 1 tablet by mouth once a day    Metformin Hcl 500 Mg Tabs (Metformin hcl) .[name]... Take one tablet by mouth two times a day    Amaryl 4 Mg Tabs (Glimepiride) .[name]... Take 1 tablet by mouth once a day in the am...  Problem # 3:  IRRITABLE BOWEL SYNDROME (ICD-564.1) GI is stable>  continue diet + OTC meds as needed...  Problem # 4:  OVERACTIVE BLADDER (ICD-596.51) She has symptoms of OVERACTIVE bladder & we discussed trial of TOVIAZ for this...  She will f/u w/ GYN for further eval & she understands that she may need surg for the uterine prolapse...  Problem # 5:  DEGENERATIVE JOINT DISEASE (ICD-715.90) She will continue the Mobic Prn & f/u w/ DrGioffre. Her updated medication list for this problem includes:    Adult Aspirin Ec Low Strength 81 Mg Tbec (Aspirin) .[name]... Take 1 tablet by mouth once a day    Meloxicam 7.5 Mg Tabs (Meloxicam) .[name]... Take 1 tab by mouth ad as needed for arthritis pain...  Problem # 6:  OTHER MEDICAL PROBLEMS AS NOTED>>>  Complete Medication List: 1)  Adult Aspirin Ec Low Strength 81 Mg Tbec (Aspirin) .... Take 1 tablet by mouth once a day 2)  Metformin Hcl 500 Mg Tabs (Metformin  hcl) .... Take one tablet by mouth two times a day 3)  Amaryl 4 Mg Tabs (Glimepiride) .... Take 1 tablet by mouth once a day in the am... 4)  Fish Oil 1000 Mg Caps (Omega-3 fatty acids) .... Take 1 cap daily.Marland KitchenMarland Kitchen 5)  Oscal 500/200 D-3 500-200 Mg-unit Tabs (Calcium-vitamin d) .[name].. 1 tab twice daily.Marland KitchenMarland Kitchen 6)  Centrum Silver  Tabs (Multiple vitamins-minerals) .... Take 1 tablet by mouth once a day 7)  Clorazepate Dipotassium 7.5 Mg Tabs (Clorazepate dipotassium) .... Take one tablet by mouth three times a day as needed for nerves 8)  Meloxicam 7.5 Mg Tabs (Meloxicam) .... Take 1 tab by mouth ad as needed for arthritis pain.Marland KitchenMarland Kitchen 9)  Prodigy Blood Glucose Test Strp (Glucose blood) .... Test blood sugar two times a day 10)  Toviaz 8 Mg Xr24h-tab (Fesoterodine fumarate) .... Take 1 tab by mouth at bedtime...  Patient Instructions: 1)  Today we updated your med list- see below.... 2)  Continue your current meds the same... 3)  We wrote a new perscription for TOVIAZ to take one at bedtime to help w/ the bladder symptoms... be sure to follow up w/ DrDickstein regarding the uterine prolapse problem... 4)  Please return to our lab one morning this week for your FASTING blood work... then please call the "phone tree" in a few days for your lab results.Marland KitchenMarland Kitchen 5)  Call for any questions.Marland KitchenMarland Kitchen 6)  Please schedule a follow-up appointment in 6 months. Prescriptions: TOVIAZ 8 MG XR24H-TAB (FESOTERODINE FUMARATE) take 1 tab by mouth at bedtime...  #30 x 11   Entered and Authorized by:   Michele Mcalpine MD   Signed by:   Michele Mcalpine MD on 02/13/2010   Method used:   Print then Give to Patient   RxID:   [id number] MELOXICAM 7.5 MG TABS (MELOXICAM) take 1 tab by mouth ad as needed for arthritis pain...  #30 x 6   Entered and Authorized by:   Michele Mcalpine MD   Signed by:   Michele Mcalpine MD on 02/13/2010   Method used:   Print then Give to Patient   RxID:   [id number] CLORAZEPATE DIPOTASSIUM 7.5 MG TABS  (CLORAZEPATE DIPOTASSIUM) take one tablet by mouth three times a day as needed for nerves  #90 x 6   Entered and Authorized by:   Michele Mcalpine MD   Signed by:   Michele Mcalpine MD on 02/13/2010   Method used:   Print then Give to Patient   RxID:   [id number] AMARYL 4 MG TABS (GLIMEPIRIDE) Take 1 tablet by mouth once a day in the AM...  #30 x 11   Entered and Authorized by:   Michele Mcalpine MD   Signed by:   Michele Mcalpine MD on 02/13/2010   Method used:   Print then Give to Patient   RxID:   [id number] METFORMIN HCL 500 MG TABS (METFORMIN HCL) take one tablet by mouth two times a day  #60 x 11   Entered and Authorized by:   Michele Mcalpine MD   Signed by:   Michele Mcalpine MD on 02/13/2010   Method used:   Print then Give to Patient   RxID:   [id number]

## 2010-08-15 NOTE — Progress Notes (Signed)
Summary: appt  Phone Note Call from Patient Call back at Home Phone [phone]   Caller: Patient Call For: nadel Reason for Call: Talk to Nurse Summary of Call: pain and numbness in left leg.  More pain at night.  Pt is a diabetic.  Would like to get checked out.  Wants to see SN. Initial call taken by: Eugene Gavia,  August 01, 2009 10:28 AM  Follow-up for Phone Call        Pt c/o pain in left thigh, with numbness that goes down her left leg. Pt denies any heat or redness to the area. Pt states the pain is only in the thigh area, but her whole left leg feels numb and feels like someone is "stabbing it with something." This has been going on x 2 days.  Offered pt appt with TP, but pt refused, states she would rather see SN if needed. Please advise. Carron Curie CMA  August 01, 2009 10:47 AM   Additional Follow-up for Phone Call Additional follow up Details #1::        we have an opeining on 1-19 at 10:30   thanks Randell Loop CMA  August 01, 2009 11:03 AM   pt scheduled. Carron Curie CMA  August 01, 2009 11:06 AM

## 2010-08-15 NOTE — Assessment & Plan Note (Signed)
Summary: flu shot/jd   Nurse Visit   Allergies: 1)  ! Codeine 2)  ! * Oil of Olay 3)  ! * Statin Meds  Orders Added: 1)  Flu Vaccine 49yrs + MEDICARE PATIENTS [Q2039] 2)  Administration Flu vaccine - MCR [G0008] Flu Vaccine Consent Questions     Do you have a history of severe allergic reactions to this vaccine? no    Any prior history of allergic reactions to egg and/or gelatin? no    Do you have a sensitivity to the preservative Thimersol? no    Do you have a past history of Guillan-Barre Syndrome? no    Do you currently have an acute febrile illness? no    Have you ever had a severe reaction to latex? no    Vaccine information given and explained to patient? yes    Are you currently pregnant? no    Lot Number:[id number]   Exp Date:01/13/2011   Site Given  Left Deltoid IMu    Clarise Cruz [facility])  April 11, 2010 9:07 AM

## 2010-08-15 NOTE — Progress Notes (Signed)
Summary: rx  Phone Note Call from Patient Call back at Home Phone [phone]   Caller: Patient Call For: Kimberly Aguirre Reason for Call: Acute Illness, Talk to Nurse Summary of Call: cough, congestion, hot and cold - tried OTC meds.  Sore throat, sugar up.  Can you call something in? [facility]. Initial call taken by: Eugene Gavia,  August 09, 2009 10:13 AM  Follow-up for Phone Call        Pt c/o S.O.B with activity, sore throat, productive cough, head and chest congestion, decrease in appetite, and sneezing x 5 days. Pt denies wheezing, fever, and chest tightness.  Pt has tried TheraFlu Cold with Honey & Lemon once. Pt has also been using Alka-Seltzer severe cold & cough, sugar-free everynight. Pt c/o blood sugar readings being as high as 256 on 08/06/2009 and lowest being 146 on 08/09/2009. Pt has been using warm salt water gargle rinses and peroxide. Pt tried [employer] for the congestion stating it gave some relief bringing up thick mucus. Please advise. Thanks. Zackery Barefoot CMA  August 09, 2009 10:37 AM   Additional Follow-up for Phone Call Additional follow up Details #1::        per SN---ok for pt to have zpak #1  take as directed and use mucinex max 1200mg   1 by mouth two times a day with plenty of fluids Randell Loop CMA  August 09, 2009 3:11 PM     Additional Follow-up for Phone Call Additional follow up Details #2::    Spoke with pt and made aware of the above recs per SN.  Pt verbalized understanding. Follow-up by: Vernie Murders,  August 09, 2009 3:23 PM  New/Updated Medications: ZITHROMAX Z-PAK 250 MG TABS (AZITHROMYCIN) take as directed Prescriptions: ZITHROMAX Z-PAK 250 MG TABS (AZITHROMYCIN) take as directed  #1 x 0   Entered by:   Vernie Murders   Authorized by:   Michele Mcalpine MD   Signed by:   Vernie Murders on 08/09/2009   Method used:   Electronically to        [facility] (retail)       [address]  [place], [place]  [id number]       Ph: [phone]       Fax: [phone]   RxID:   [id number]

## 2010-08-17 ENCOUNTER — Encounter: Payer: Self-pay | Admitting: Pulmonary Disease

## 2010-08-17 ENCOUNTER — Other Ambulatory Visit: Payer: Medicare Other

## 2010-08-17 ENCOUNTER — Other Ambulatory Visit: Payer: Self-pay | Admitting: Pulmonary Disease

## 2010-08-17 ENCOUNTER — Ambulatory Visit: Admit: 2010-08-17 | Payer: Self-pay | Admitting: Pulmonary Disease

## 2010-08-17 ENCOUNTER — Ambulatory Visit (INDEPENDENT_AMBULATORY_CARE_PROVIDER_SITE_OTHER): Payer: Medicare Other | Admitting: Pulmonary Disease

## 2010-08-17 DIAGNOSIS — M1990 Unspecified osteoarthritis, unspecified site: Secondary | ICD-10-CM

## 2010-08-17 DIAGNOSIS — E785 Hyperlipidemia, unspecified: Secondary | ICD-10-CM

## 2010-08-17 DIAGNOSIS — E119 Type 2 diabetes mellitus without complications: Secondary | ICD-10-CM

## 2010-08-17 DIAGNOSIS — E7800 Pure hypercholesterolemia, unspecified: Secondary | ICD-10-CM

## 2010-08-17 DIAGNOSIS — K449 Diaphragmatic hernia without obstruction or gangrene: Secondary | ICD-10-CM

## 2010-08-17 DIAGNOSIS — D126 Benign neoplasm of colon, unspecified: Secondary | ICD-10-CM

## 2010-08-17 DIAGNOSIS — I10 Essential (primary) hypertension: Secondary | ICD-10-CM

## 2010-08-17 DIAGNOSIS — R0789 Other chest pain: Secondary | ICD-10-CM

## 2010-08-17 DIAGNOSIS — K589 Irritable bowel syndrome without diarrhea: Secondary | ICD-10-CM

## 2010-08-17 LAB — BASIC METABOLIC PANEL
BUN: 12 mg/dL (ref 6–23)
Calcium: 9.3 mg/dL (ref 8.4–10.5)
Chloride: 99 mEq/L (ref 96–112)
Glucose, Bld: 158 mg/dL — ABNORMAL HIGH (ref 70–99)
Potassium: 4.2 mEq/L (ref 3.5–5.1)

## 2010-08-17 LAB — HEMOGLOBIN A1C: Hgb A1c MFr Bld: 7.2 % — ABNORMAL HIGH (ref 4.6–6.5)

## 2010-08-17 LAB — LDL CHOLESTEROL, DIRECT: Direct LDL: 157.3 mg/dL

## 2010-08-18 NOTE — Letter (Signed)
Summary: Medical Clearance/[facility]  Medical Clearance/[facility]   Imported By: Sherian Rein 06/06/2010 07:53:09  _____________________________________________________________________  External Attachment:    Type:   Image     Comment:   External Document

## 2010-08-23 NOTE — Progress Notes (Signed)
Summary: labwork  Phone Note Call from Patient Call back at [facility] Phone [phone]   Caller: Patient Call For: nadel Reason for Call: Talk to Nurse Summary of Call: Patient calling to ask if she can come in early to get labwork done, since she will have to be fasting.  Her appt is scheduled for thurs.08/17/10 @ 10:00/mhh Initial call taken by: Lehman Prom,  August 15, 2010 10:53 AM  Follow-up for Phone Call        Patient phoned stated that she was returning a call she can be reached at [phone]. Marland KitchenVedia Coffer  August 15, 2010 3:17 PM  Spoke with pt and she wants tocoem in early on thursday for fastin glabs for her appt on 2-2- at 10:00 am. Please advsie on what labs to enter. Carron Curie CMA  August 15, 2010 3:25 PM   Additional Follow-up for Phone Call Additional follow up Details #1::        per sn ok. bmet 401.9 and a1c 250.00. Per Victorino Dike pt already aware she can come in at that time to have fasting labs done [facility]  August 15, 2010 5:08 PM   New Problems: ESSENTIAL HYPERTENSION (ICD-401.9)   New Problems: ESSENTIAL HYPERTENSION (ICD-401.9)

## 2010-09-06 NOTE — Assessment & Plan Note (Signed)
Summary: 6 month   Vital Signs:  Patient profile:   77 year old female Height:      61 inches Weight:      134.38 pounds BMI:     25.48 O2 Sat:      98 % on Room air Temp:     97.1 degrees F oral Pulse rate:   71 / minute BP sitting:   126 / 78  (left arm) Cuff size:   regular  Vitals Entered By: Randell Loop CMA (August 17, 2010 9:40 AM)  O2 Sat at Rest %:  98 O2 Flow:  Room air CC: 6 month ROV & review of mult medical problems... Is Patient Diabetic? Yes Pain Assessment Patient in pain? no      Comments meds updated today with pt   CC:  6 month ROV & review of mult medical problems....  History of Present Illness: 77 y/o BF here for a follow up visit...  he has multiple medical problems as noted below...    ~  August 03, 2009:  c/o discomfort in left leg- prev burning in feet, but this has resolved & now a pain in groin area assoc w/ stabbing pain in thigh area... only prev hip films was pelvis XRay- neg in 2006... monofilament test = norm, sl decr ROM hip & prev right hip injection by DrGioffre in 2006>> we discussed Mobic, refer to Ortho for XRay & poss injection.   ~  February 13, 2010:  she saw DrGioffre for Ortho- "I'm his mystery pt" state he couldn't find anything & Mobic helps some... c/o excess urination c/w overactive bladder & we discussed trial of Toviaz for these symptoms... she has uterine prolapse as well & may need surg as she declines pessary Rx from GYN... she saw DrHecker 2/11 w/ mild diabetic retinopathy bilat... needs to ret for FASTING labs...    ~  August 17, 2010:  she had total vag hysterectomy & repair w/ ureteral sling from DrDickstein & Mody 12/11> bladder symptoms much improved...  she's been controlling BP & Lipids w/ diet alone, and BS is fair on diet +23meds (see below)... she has osteopenia followed by GYN but pt refuses bisphos therapy- on calcium, vits...   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  ESSENTIAL HYPERTENSION, BORDERLINE (ICD-401.9) - controlled on diet... BP= 126/78 & denies HA, fatigue, visual changes, CP, palipit, dizziness, syncope, dyspnea, edema, etc...  CHEST PAIN, ATYPICAL (ICD-786.59) - hx atypical CP 2010> sharp, right chest, noticed at night & not days, 2-4/10 severity, no assoc n/v/diaphoresis, resolves spont in a few minutes... baseline EKG= NSR, WNL.[name]. had NuclearStressTest Dec00 which was neg- no infarct or ischemia, EF= 77%... no known coronary dis, but mult risk factors= DM, Chol, +FamHx strokes... [facility] & [facility]...   VENOUS INSUFFICIENCY (ICD-459.81) - follows low sodium diet, elevates legs, support hose... no swelling.  HYPERCHOLESTEROLEMIA (ICD-272.0) - on FISH OIL daily, she stopped her prev Simvastatin 20mg  on her own... refuses statin therapy due to pain and swelling in her ankles, and she just wants Diet Rx alone...  ~  FLP 8/08 showed TChol 147, TG 102, HDL 35, LDL 92... ? on her simvastatin 20mg /d.  ~  FLP 8/09 showed TChol 163, TG 129, HDL 29, LDL 109... she prefers diet alone- discussed!  ~  FLP 8/10 showed TChol 211, Tg 187, HDL 41, LDL 146...offered Prav40 w/ hope fewer side effects.  ~  1/11: she reports trial Prav40 but stopped this due to "cramping in joints, esp ankles"...  ~  FLP 8/11 on diet alone showed TChol 189, TG 155, HDL 40, LDL 118... "I'm committed to diet & ex"  ~  FLP 2/12 showed TChol 220, TG 182, HDL 44, LDL 157... she confirms her refusal of statin rx.  DIABETES MELLITUS (ICD-250.00) - on METFORMIN 500mg Bid & AMARYL 4mg /d...   ~  labs 12/08 showed BS=101, HgA1c=7.3.Marland KitchenMarland Kitchen rec to add Actos but pt didn't fill this Rx.  ~  labs 8/09 showed BS= 146, HgA1c= 7.4.Marland KitchenMarland Kitchen rec incr Metform 2Bid + Amaryl 4mg /d...  **note- pt states intol to Metform 2Bid and had to decr back to 1Bid + the Glimep...  ~  labs  2/10 showed BS= 53, A1c= 7.0.Marland KitchenMarland Kitchen rec same meds, better diet, may need decr Glimep.  ~  labs 8/10 showed BS= 127, A1c= 7.0.[name]. rec> keep same, better diet.  ~  1/11: she reports BS at home all in the 100-140+ range...  ~  labs 8/11 on Metfor500Bid+Glim4 showed BS= 151, A1c= 7.2  ~  labs 2/12 showed BS= 158, A1c= 7.2.Marland KitchenMarland Kitchen continue same + better diet.  HIATAL HERNIA (ICD-553.3) - last EGD was 1980 showing 2cmHH, mild esophagitis and gastritis...  IRRITABLE BOWEL SYNDROME (ICD-564.1) - she uses Miralax, Senakot as needed...  COLONIC POLYPS (ICD-211.3) - last colonoscopy 7/05 by DrBrodie showed 2-49mm polyps... f/u planned 33yrs.  OVERACTIVE BLADDER (ICD-596.51) - she notes symptoms c/w overactive bladder 8/11 & we discussed trial of Toviaz... symptoms much improved after surg 12/11 [facility] & ureteral sling) and Toviaz stopped...  DEGENERATIVE JOINT DISEASE (ICD-715.90) - right hip DJD per DrGioffre w/ shot in 2006... repeat eval Spring2011 and nothing found according to the pt "I'm his mystery pt" she says... takes MOBIC 7.5mg  Prn w/ some relief...  BACK PAIN, LUMBAR (ICD-724.2) - she prev had some leg pain at night, ?neuropathy and tried Lyrica...  ~  1/11:  denies back pain etc...  OSTEOPENIA (ICD-733.90) - on OSCAL Bid + vits daily, she stopped Fosamax on her own & states intol to bisphosphonates...   ~  BMD 5/08 showed TScores -1.1 to -1.5.[name]. & VitD level=64 in 8/08... rec to start Fosamax- pt refused.  ~  BMD 9/11 showed TScores -2.0 in Spine, and -1.9 in left FemNeck... pt refuses bisphos rx.  HEADACHE (ICD-784.0)  ANXIETY (ICD-300.00) - she uses CHLORAZEPATE 7.5mg  Prn...  SCREENING STUDIES & HEALTH MAINT:   ~  NuclearStressTest 12/00 was neg for infarct or ischemia, EF=77%...  ~  Pneumovax shot 2003  ~  Tetanus shot 2004 w/ local inflamm reaction.  ~  GYN= DrDickstein   Preventive Screening-Counseling & Management  Alcohol-Tobacco     Smoking Status: never  Allergies: 1)  !  Codeine 2)  ! * Oil of Olay 3)  ! * Statin Meds  Comments:  Nurse/Medical Assistant: The patient's medications and allergies were reviewed with the patient and were updated in the Medication and Allergy Lists.  Past History:  Past Medical History: BLINDNESS, LEFT EYE (ICD-369.60) ESSENTIAL HYPERTENSION (ICD-401.9) CHEST PAIN, ATYPICAL (ICD-786.59) VENOUS INSUFFICIENCY (ICD-459.81) HYPERCHOLESTEROLEMIA (ICD-272.0) DIABETES MELLITUS (ICD-250.00) HIATAL HERNIA (ICD-553.3) IRRITABLE BOWEL SYNDROME (ICD-564.1) COLONIC POLYPS (ICD-211.3) OVERACTIVE BLADDER (ICD-596.51) DEGENERATIVE JOINT DISEASE (ICD-715.90) BACK PAIN, LUMBAR (ICD-724.2) OSTEOPENIA (ICD-733.90) HEADACHE (ICD-784.0) ANXIETY (ICD-300.00) DERMATITIS (ICD-692.9)  Past Surgical History: S/P eye surgery age 58 S/P total vag hyst &  ureteral sling 12/11 by Drs Elvera Bicker  Family History: Reviewed history from 02/13/2010 and no changes required. mother died at 63 from stroke and diabetes father died at 67 from stroke 4 brothers 5 sisters  Social History: Reviewed history from 02/13/2010 and no changes required. widowed 2 children never-smoker no Etoh  Review of Systems      See HPI  The patient denies anorexia, fever, weight loss, weight gain, vision loss, decreased hearing, hoarseness, chest pain, syncope, dyspnea on exertion, peripheral edema, prolonged cough, headaches, hemoptysis, abdominal pain, melena, hematochezia, severe indigestion/heartburn, hematuria, incontinence, muscle weakness, suspicious skin lesions, transient blindness, difficulty walking, depression, unusual weight change, abnormal bleeding, enlarged lymph nodes, and angioedema.    Physical Exam  Additional Exam:  WD, WN, 77 y/o BF in NAD... GENERAL:  Alert & oriented; pleasant & cooperative... HEENT:  [place]/AT, Ophthal per DrHecker, EACs-clear, TMs-wnl, NOSE-clear, THROAT-clear & wnl. NECK:  Supple w/ fairROM; no JVD; normal carotid  impulses w/o bruits; no thyromegaly or nodules palpated; no lymphadenopathy. CHEST:  Clear to P & A; without wheezes/ rales/ or rhonchi heard... HEART:  Regular Rhythm; without murmurs/ rubs/ or gallops detected... ABDOMEN:  Soft & nontender; normal bowel sounds; no organomegaly or masses palpated, no guarding, or rebound. ... EXT: without deformities, mod arthritic changes; no varicose veins/ +venous insuffic/ no edema. some decr ROM left hip... NEURO:  CN's intact; motor testing normal; sensory testing normal & monofilament test= norm; gait normal & balance OK. DERM:  No lesions noted; no rash etc...    Impression & Recommendations:  Problem # 1:  ESSENTIAL HYPERTENSION (ICD-401.9) Controlled on diet alone... Orders: TLB-BMP (Basic Metabolic Panel-BMET) (80048-METABOL) TLB-Lipid Panel (80061-LIPID) TLB-A1C / Hgb A1C (Glycohemoglobin) (83036-A1C)  Problem # 2:  HYPERCHOLESTEROLEMIA (ICD-272.0) Despite worsening FLP on diet alone, she confirms her committment to diet/ exercise & refuses statin rx...  Problem # 3:  DIABETES MELLITUS (ICD-250.00) A1c is stable at 7.2>  same meds but needs better diet. Her updated medication list for this problem includes:    Adult Aspirin Ec Low Strength 81 Mg Tbec (Aspirin) .[name]... Take 1 tablet by mouth once a day    Metformin Hcl 500 Mg Tabs (Metformin hcl) .[name]... Take one tablet by mouth two times a day    Amaryl 4 Mg Tabs (Glimepiride) .[name]... Take 1 tablet by mouth once a day in the am...  Problem # 4:  GI >>> Followed by DrDBrodie & overdue for f/u colonoscopy... we will refer & ask GI to call pt to discuss further.  Problem # 5:  GU/ GYN >>> Followed by DrDickstein>  s/p TVH & sling surg- improved...  Problem # 6:  DEGENERATIVE JOINT DISEASE (ICD-715.90) Known DJD & followed by DrGioffre>  continue Mobic Prn. Her updated medication list for this problem includes:    Adult Aspirin Ec Low Strength 81 Mg Tbec (Aspirin) .[name]... Take 1 tablet by  mouth once a day    Meloxicam 7.5 Mg Tabs (Meloxicam) .[name]... Take 1 tab by mouth ad as needed for arthritis pain...  Problem # 7:  ANXIETY (ICD-300.00) SShe's had severe daeths in the family, and stable on the Tranxene Prn... Her updated medication list for this problem includes:    Clorazepate Dipotassium 7.5 Mg Tabs (Clorazepate dipotassium) .[name]... Take one tablet by mouth three times a day as needed for nerves  Problem # 8:  OTHER MEDICAL PROBLEMS AS NOTED>>>  Complete Medication List: 1)  Adult Aspirin Ec Low Strength 81 Mg Tbec (Aspirin) .... Take 1  tablet by mouth once a day 2)  Metformin Hcl 500 Mg Tabs (Metformin hcl) .... Take one tablet by mouth two times a day 3)  Amaryl 4 Mg Tabs (Glimepiride) .... Take 1 tablet by mouth once a day in the am... 4)  Fish Oil 1000 Mg Caps (Omega-3 fatty acids) .... Take 1 cap daily.Marland KitchenMarland Kitchen 5)  Meloxicam 7.5 Mg Tabs (Meloxicam) .... Take 1 tab by mouth ad as needed for arthritis pain.Marland KitchenMarland Kitchen 6)  Oscal 500/200 D-3 500-200 Mg-unit Tabs (Calcium-vitamin d) .[name].. 1 tab twice daily.Marland KitchenMarland Kitchen 7)  Centrum Silver Tabs (Multiple vitamins-minerals) .... Take 1 tablet by mouth once a day 8)  Vitamin D 1000 Unit Tabs (Cholecalciferol) .... Take 1 tab by mouth once daily.Marland KitchenMarland Kitchen 9)  Clorazepate Dipotassium 7.5 Mg Tabs (Clorazepate dipotassium) .... Take one tablet by mouth three times a day as needed for nerves 10)  Prodigy Blood Glucose Test Strp (Glucose blood) .... Test blood sugar two times a day  Patient Instructions: 1)  Today we updated your med list- see below.... 2)  Continue your current meds the same for now... 3)  Today we did your follow up Cholesterol check & diabetic labs... 4)  please call the "phone tree" in a few days for your lab results.Marland KitchenMarland Kitchen 5)  Keep up the good job w/ diet, exercise, etc... 6)  Call for any problems.Marland KitchenMarland Kitchen 7)  Please schedule a follow-up appointment in 6 months.   Orders Added: 1)  Est. Patient Level IV [45409] 2)  TLB-BMP (Basic Metabolic  Panel-BMET) [80048-METABOL] 3)  TLB-Lipid Panel [80061-LIPID] 4)  TLB-A1C / Hgb A1C (Glycohemoglobin) [83036-A1C]

## 2010-09-07 ENCOUNTER — Telehealth (INDEPENDENT_AMBULATORY_CARE_PROVIDER_SITE_OTHER): Payer: Self-pay | Admitting: *Deleted

## 2010-09-12 NOTE — Progress Notes (Signed)
Summary: sneezing yellowish discharge--rx for z pak   Phone Note Call from Patient   Caller: Patient Call For: nadel Summary of Call: Patient phoned stated that she is getting sick she it started off with sneezing and a sore throat, she is still sneezing and has a runny nose with a yellowish discharge. she is diabetic and wants to know what he sugguests that she use. She can be reached at [phone]   Initial call taken by: Vedia Coffer,  September 07, 2010 9:58 AM  Follow-up for Phone Call        called and spoke with pt and she stated that symptoms started on tuesday with sneezing---worse yesterday and now she has lots of yellow drainag---denies any fever or body aches.  pt is requesting something to take for this.  please advise Randell Loop [facility]  September 07, 2010 10:59 AM    allergies:   statins, oil of olay,codeine    Additional Follow-up for Phone Call Additional follow up Details #1::        Patient called back and wanted to know if we could call her back to let her know if something was called in so she could pick it up. She can be reached at [phone]    Additional Follow-up for Phone Call Additional follow up Details #2::    per SN---ok for pt to have zpak #1  take as directed and otc mucinex 600mg    2 by mouth two times a day with lots of fluids.  thanks Randell Loop CMA  September 07, 2010 11:59 AM   called and spoke with pt.  pt aware of Sn's recs and rx sent to pharmacy Arman Filter LPN  September 07, 2010 12:03 PM   New/Updated Medications: ZITHROMAX Z-PAK 250 MG TABS (AZITHROMYCIN) take as directed Prescriptions: ZITHROMAX Z-PAK 250 MG TABS (AZITHROMYCIN) take as directed  #1 x 0   Entered by:   Arman Filter LPN   Authorized by:   Michele Mcalpine MD   Signed by:   Arman Filter LPN on 47/82/9562   Method used:   Electronically to        [facility] (retail)       [address]       [place], [place]  [id number]       Ph:  [phone]       Fax: [phone]   RxID:   [id number]

## 2010-09-15 NOTE — Op Note (Signed)
Kimberly Aguirre, Kimberly Aguirre                ACCOUNT NO.:  [id number]  MEDICAL RECORD NO.:  [id number]          PATIENT TYPE:  INP  LOCATION:  9317                          FACILITY:  [facility]  PHYSICIAN:  Aneesha Holloran A. Thao Bauza, M.D.DATE OF BIRTH:  Aug 07, 1933  DATE OF PROCEDURE:  07/07/2010 DATE OF DISCHARGE:                              OPERATIVE REPORT   PREOPERATIVE DIAGNOSIS:  Total vaginal prolapse, stress incontinence.  POSTOPERATIVE DIAGNOSIS:  Total vaginal prolapse, stress incontinence.  PROCEDURES:  Vaginal hysterectomy, anterior and posterior repair by Dr. Rosalio Macadamia.  Tension-free vaginal tape and cystoscopy by Dr. Juliene Pina which is dictated separately.  SURGEON:  Lindee Leason A. Rosalio Macadamia, MD  ASSISTANT:  Darryl Nestle, MD  ANESTHESIA:  Epidural.  INDICATIONS:  This is a 77 year old G2, P2-0-0-2 woman, who has had a total vaginal prolapse for significant amount of time.  The patient has not wanted to have any surgery until recently has gotten to the point where she was so uncomfortable that she has requested surgical repair. The patient also has some stress incontinence and is ready for surgical repair for this as well.  FINDINGS:  Third-degree cervical uterine prolapse, third-degree cystocele, and second-degree rectocele.  DESCRIPTION OF PROCEDURE:  The patient was brought into the operating room and given adequate epidural anesthesia.  She was placed in dorsal lithotomy position.  Her suprapubic area was washed with Betadine followed by her perineum and vagina.  A Foley catheter was placed within the bladder.  The patient was draped in sterile fashion after the surgeon's gown and gloves were changed.  A weighted speculum was placed within the vagina.  The cervix was grasped with two [place] tenaculums and the cervix was infiltrated with vasopressin.  The cervix was circumcised sharply and the vaginal mucosa was dissected off of the cervix.  The posterior cul-de-sac was entered  sharply.  Uterosacral ligaments were clamped, cut, and suture ligated with 0 Vicryl ligature. The posterior vaginal mucosa was closed with a 0 Vicryl running locked stitch for hemostasis during this procedure.  The vaginal mucosa was pushed off of the anterior lip of the cervix and clamps were placed across the cardinal ligaments, and it was cut and tied with 0 Vicryl ligatures.  The anterior cul-de-sac was attempted to be entered with sharp and blunt dissection, however, this was not entered in this fashion.  The uterus was rotated posteriorly and starting at the left round ligament.  The round ligament was clamped with the LigaSure, cauterized in several places and then cut.  Then, the utero-ovarian ligaments were clamped, cut, and cauterized.  This was done down to the uterine artery which was clamped and cauterized over several areas before it was cut.  The entire left side of the uterus was freed in this fashion and then the same procedure was performed on the right starting along the right uteroovarian ligaments and this was clamped and cauterized x2-3 before being cut down to the uterine arteries this was clamped and cauterized x3 and cut.  Then the round ligament was identified, clamped, cauterized, and cut.  There were small bleeders present along the right side wall which were  cauterized and it was felt that adequate hemostasis was present.  A stitch was taken to the posterior peritoneum using 0 Vicryl to help obliterate the cul-de-sac, this was left untied from uterosacral to uterosacral.  Then the peritoneum was identified and using 0 Vicryl a pursestring stitch was placed.  The initial posterior stitch was tied followed by the pursestring stitch being tied after adequate hemostasis was present. Attention was then turned to the cystocele.  The anterior cystocele was identified.  The vaginal mucosa was infiltrated with vasopressin, it was dissected off the bladder with blunt  and sharp dissection.  Back to the lateral side walls, once this was adequately dissected free, the endopelvic fascia was easily identified and the fascia was then closed in Kelly plication stitches using 2-0 Vicryl in mattress-type stitches. These were taken across the entire anterior platform of the bladder and the cystocele was reduced in this fashion with good support to the bladder.  The excess vaginal mucosa was then excised.  The vaginal mucosa was then closed with 2-0 chromic initially in a running lock stitch and then a baseball-type stitch was taken down to the midline. This was closed in its entirety.  The attention was turned to a rectocele repair, it was felt that there was enough rectocele that this needed to be reduced.  Allis clamps were placed across the introitus approximately 2-3 cm apart.  A V-shaped incision was made across the perineum and the perineal skin was excised.  The vaginal mucosa was then dissected in the midline up approximately 4 cm.  The vaginal mucosa was incised in the midline and the vaginal mucosa was dissected off the rectum with blunt and sharp dissection.  Bleeders were cauterized. Levator ani muscles were then identified and this area was dissected free using 0 Vicryl ligatures, mattress type stitches were taken across the levator ani muscles for good perineal support.  This was performed x2.  Again, adequate hemostasis was obtained through this dissection with some small cautery.  The endopelvic fascia was identified and a pursestring stitch was taken with 2-0 Vicryl in a pursestring stitch. The excess vaginal mucosa was excised.  The vaginal mucosa was then closed using 2-0 chromic in a running lock stitch and the perineal mucosa was closed using 3-0 chromic in subcutaneous and then subcuticular running stitch coming up.  It was felt that adequate hemostasis was present throughout.  At this time, this part of the surgery was completed and Dr.  Juliene Pina, took over to do her transvaginal tape for urethral sling and her cystoscopy that was dictated separately by Dr. Juliene Pina.  There were no complications from the surgery.  The specimen included uterus with cervix went to Pathology and estimated blood loss was 150 mL.     Nikola Blackston A. Rosalio Macadamia, M.D.     SAD/MEDQ  D:  07/07/2010  T:  07/08/2010  Job:  [id number]  Electronically Signed by Floyde Parkins M.D. on 09/15/2010 12:35:54 PM

## 2010-09-18 NOTE — Discharge Summary (Signed)
  NAMETONEA, LEIPHART                ACCOUNT NO.:  [id number]  MEDICAL RECORD NO.:  [id number]          PATIENT TYPE:  INP  LOCATION:  9317                          FACILITY:  [facility]  PHYSICIAN:  Darryl Nestle, MD     DATE OF BIRTH:  21-Nov-1933  DATE OF ADMISSION:  07/07/2010 DATE OF DISCHARGE:  07/08/2010                              DISCHARGE SUMMARY   SURGERY:  On July 07, 2010, total vaginal hysterectomy and anterior colporrhaphy, posterior colporrhaphy, and perineorrhaphy by Dr. Rosalio Macadamia.  Tension-free vaginal tape and cystoscopy performed by Dr. Juliene Pina.  Surgery was uncomplicated.  Please see OP note for details.  The patient was admitted for postop care.  Postoperative findings and labs were normal and  diabetes was well controlled. On postop day 1, the patient ambulating, voiding  once the catheter was removed, and tolerated general diet.  Pain was adequately controlled. There was no vaginal bleeding. Hence she was discharged home.  Postoperative instructions and followup plan reviewed, in the office with Dr. Rosalio Macadamia and Dr Juliene Pina in 2 weeks.  Discharge disposition : home Condition: Stable.   Darryl Nestle, MD     VM/MEDQ  D:  09/14/2010  T:  09/15/2010  Job:  [id number]  Electronically Signed by Susa Day Jessikah Dicker  on 09/18/2010 09:26:44 AM

## 2010-09-25 LAB — CBC
HCT: 42.5 % (ref 36.0–46.0)
MCH: 29.6 pg (ref 26.0–34.0)
MCH: 30.3 pg (ref 26.0–34.0)
MCHC: 33.2 g/dL (ref 30.0–36.0)
Platelets: 186 10*3/uL (ref 150–400)
Platelets: 234 10*3/uL (ref 150–400)
RBC: 3.82 MIL/uL — ABNORMAL LOW (ref 3.87–5.11)
RDW: 13.1 % (ref 11.5–15.5)
RDW: 13.3 % (ref 11.5–15.5)
WBC: 11.3 10*3/uL — ABNORMAL HIGH (ref 4.0–10.5)

## 2010-09-25 LAB — GLUCOSE, CAPILLARY
Glucose-Capillary: 129 mg/dL — ABNORMAL HIGH (ref 70–99)
Glucose-Capillary: 157 mg/dL — ABNORMAL HIGH (ref 70–99)
Glucose-Capillary: 184 mg/dL — ABNORMAL HIGH (ref 70–99)
Glucose-Capillary: 63 mg/dL — ABNORMAL LOW (ref 70–99)

## 2010-09-25 LAB — BASIC METABOLIC PANEL
CO2: 31 mEq/L (ref 19–32)
Calcium: 8.7 mg/dL (ref 8.4–10.5)
Chloride: 101 mEq/L (ref 96–112)
Creatinine, Ser: 0.74 mg/dL (ref 0.4–1.2)
GFR calc Af Amer: >60 mL/min (ref >60)
GFR calc non Af Amer: >60 mL/min (ref >60)
Potassium: 3.8 mEq/L (ref 3.5–5.1)

## 2010-09-25 LAB — SURGICAL PCR SCREEN
MRSA, PCR: NEGATIVE
Staphylococcus aureus: NEGATIVE

## 2010-10-13 ENCOUNTER — Encounter: Payer: Self-pay | Admitting: Pulmonary Disease

## 2010-11-20 ENCOUNTER — Telehealth: Payer: Self-pay | Admitting: Pulmonary Disease

## 2010-11-20 NOTE — Telephone Encounter (Signed)
ATC x 1. Line busy.

## 2010-11-20 NOTE — Telephone Encounter (Signed)
Pt c/o elevated BP for several days and says she does not feel well. Feels very weak. She denies any headache or chest pain. She wants OV and will see TP on Tues., 5/8.

## 2010-11-20 NOTE — Telephone Encounter (Signed)
Spoke w/ pt and she states [employer] was suppose to send Dr. Kriste Basque a fax regarding her diabetic shoes. Pt states she does not want these any longer. Pt states the copay is to expensive and can't afford it. Will forward to Leigh to see if she has received a fax on this. Please advise Leigh. Thanks  Carver Fila, CMA

## 2010-11-20 NOTE — Telephone Encounter (Signed)
I have not seen any forms from this company---pt will need to contact them to cancel this as well. thanks

## 2010-11-20 NOTE — Telephone Encounter (Signed)
Pt states she has already called and cancelled order. Nothing further was needed

## 2010-11-20 NOTE — Telephone Encounter (Signed)
Pt called back wants to know if we have received the fax regarding the shoes yet she can be reached at [phone].Vedia Coffer

## 2010-11-21 ENCOUNTER — Encounter: Payer: Self-pay | Admitting: Adult Health

## 2010-11-21 ENCOUNTER — Ambulatory Visit (INDEPENDENT_AMBULATORY_CARE_PROVIDER_SITE_OTHER): Payer: Medicare Other | Admitting: Adult Health

## 2010-11-21 DIAGNOSIS — I10 Essential (primary) hypertension: Secondary | ICD-10-CM

## 2010-11-21 NOTE — Patient Instructions (Signed)
Check blood pressure 3 x weeks , keep log Check first thing in am. At rest  Low salt diet Call if blood pressure -top number is >150-160 on consitent basis Please contact office for sooner follow up if symptoms do not improve or worsen or seek emergency care   follow up Dr. Kriste Basque  In 2-3 months

## 2010-11-22 ENCOUNTER — Other Ambulatory Visit: Payer: Self-pay | Admitting: Pulmonary Disease

## 2010-11-22 DIAGNOSIS — Z1231 Encounter for screening mammogram for malignant neoplasm of breast: Secondary | ICD-10-CM

## 2010-11-25 ENCOUNTER — Inpatient Hospital Stay (INDEPENDENT_AMBULATORY_CARE_PROVIDER_SITE_OTHER)
Admission: RE | Admit: 2010-11-25 | Discharge: 2010-11-25 | Disposition: A | Discharge status: Home / Self Care | Payer: Medicare Other | Source: Ambulatory Visit | Attending: Emergency Medicine | Admitting: Emergency Medicine

## 2010-11-25 DIAGNOSIS — R6889 Other general symptoms and signs: Secondary | ICD-10-CM

## 2010-11-26 NOTE — Progress Notes (Signed)
  Subjective:    Patient ID: Kimberly Aguirre, female    DOB: 1933-11-11, 77 y.o.   MRN: [id number]  HPI 77 yo female with known hx of DM, Hyperlipidemia  11/21/10  Pt presents for work in visit. Complains of elevated BP onset 4days ago w/ some left arm tingling/numbness, decreased energy, headache (140/70) and symptoms returned again yesterday with BP 140/80 - denies changes in vision, difficulty swallowing, arm weakness. She says she got very upset when her b/p was up that high. She felt nervous. Had no associated visual/speech changes. No syyncope or chest pain. Headache was dull and mild and is now resolved.  NO recent injury or trauma. No behavior or memory changes. No new meds, denies decongestants.   Blood pressure today is excellent at 114/66.     Review of Systems Constitutional:   No  weight loss, night sweats,  Fevers, chills, fatigue, or  lassitude.  HEENT:     Difficulty swallowing,  Tooth/dental problems, or  Sore throat,                No sneezing, itching, ear ache, nasal congestion, post nasal drip,   CV:  No chest pain,  Orthopnea, PND, swelling in lower extremities, anasarca, dizziness, palpitations, syncope.   GI  No heartburn, indigestion, abdominal pain, nausea, vomiting, diarrhea, change in bowel habits, loss of appetite, bloody stools.   Resp: No shortness of breath with exertion or at rest.  No excess mucus, no productive cough,  No non-productive cough,  No coughing up of blood.  No change in color of mucus.  No wheezing.  No chest wall deformity  Skin: no rash or lesions.  GU: no dysuria, change in color of urine, no urgency or frequency.  No flank pain, no hematuria   MS:  No joint pain or swelling.  No decreased range of motion.  No back pain.  Psych:  No change in mood or affect. No depression or anxiety.  No memory loss.         Objective:   Physical Exam GEN: A/Ox3; pleasant , NAD, well nourished   HEENT:  [place]/AT,  EACs-clear, TMs-wnl, NOSE-clear,  THROAT-clear, no lesions, no postnasal drip or exudate noted.   NECK:  Supple w/ fair ROM; no JVD; normal carotid impulses w/o bruits; no thyromegaly or nodules palpated; no lymphadenopathy.  RESP  Clear  P & A; w/o, wheezes/ rales/ or rhonchi.no accessory muscle use, no dullness to percussion  CARD:  RRR, no m/r/g  , no peripheral edema, pulses intact, no cyanosis or clubbing.  GI:   Soft & nt; nml bowel sounds; no organomegaly or masses detected.  Musco: Warm bil, no deformities or joint swelling noted.   Neuro: alert, no focal deficits noted.   nml gait, CN2-12 intact , nml grips bilaterally, neg arm drop.  Facial features symmetrical   Skin: Warm, no lesions or rashes         Assessment & Plan:

## 2010-11-26 NOTE — Assessment & Plan Note (Signed)
Intermittent elevated blood pressure.  B/p today is well controlled.  Advised on healthy diet and low salt.  To check b/p on regular basis return with log  Plan: Check blood pressure 3 x weeks , keep log Check first thing in am. At rest  Low salt diet Call if blood pressure -top number is >150-160 on consitent basis Please contact office for sooner follow up if symptoms do not improve or worsen or seek emergency care   follow up Dr. Kriste Basque  In 2-3 months

## 2010-11-27 ENCOUNTER — Telehealth: Payer: Self-pay | Admitting: Pulmonary Disease

## 2010-11-27 NOTE — Telephone Encounter (Signed)
Spoke w/ pt and per TD pt just needs to bring in her bp monitor and we will check her bp w/ our bp cuff and then check it against hers. Pt is coming in today around 10:00 to have this done. Irving Burton is aware pt will be coming in

## 2010-12-14 ENCOUNTER — Ambulatory Visit
Admission: RE | Admit: 2010-12-14 | Discharge: 2010-12-14 | Disposition: A | Discharge status: Home / Self Care | Payer: Medicare Other | Source: Ambulatory Visit | Attending: Pulmonary Disease | Admitting: Pulmonary Disease

## 2010-12-14 DIAGNOSIS — Z1231 Encounter for screening mammogram for malignant neoplasm of breast: Secondary | ICD-10-CM

## 2011-02-13 ENCOUNTER — Telehealth: Payer: Self-pay | Admitting: Pulmonary Disease

## 2011-02-13 DIAGNOSIS — E7800 Pure hypercholesterolemia, unspecified: Secondary | ICD-10-CM

## 2011-02-13 DIAGNOSIS — D126 Benign neoplasm of colon, unspecified: Secondary | ICD-10-CM

## 2011-02-13 DIAGNOSIS — F411 Generalized anxiety disorder: Secondary | ICD-10-CM

## 2011-02-13 DIAGNOSIS — K589 Irritable bowel syndrome without diarrhea: Secondary | ICD-10-CM

## 2011-02-13 DIAGNOSIS — K5289 Other specified noninfective gastroenteritis and colitis: Secondary | ICD-10-CM

## 2011-02-13 DIAGNOSIS — I10 Essential (primary) hypertension: Secondary | ICD-10-CM

## 2011-02-13 DIAGNOSIS — E119 Type 2 diabetes mellitus without complications: Secondary | ICD-10-CM

## 2011-02-13 NOTE — Telephone Encounter (Signed)
Pt calling again in reference to labs can be reached at [phone] says it's ok to lmom.Raylene Everts

## 2011-02-13 NOTE — Telephone Encounter (Signed)
Called and spoke with pt and she is aware of labs in the computer

## 2011-02-13 NOTE — Telephone Encounter (Signed)
Leigh, please have SN which labs he would like to order. Thanks.

## 2011-02-15 ENCOUNTER — Ambulatory Visit (INDEPENDENT_AMBULATORY_CARE_PROVIDER_SITE_OTHER): Payer: Medicare Other | Admitting: Pulmonary Disease

## 2011-02-15 ENCOUNTER — Encounter: Payer: Self-pay | Admitting: Pulmonary Disease

## 2011-02-15 ENCOUNTER — Other Ambulatory Visit: Payer: Self-pay | Admitting: Pulmonary Disease

## 2011-02-15 ENCOUNTER — Other Ambulatory Visit (INDEPENDENT_AMBULATORY_CARE_PROVIDER_SITE_OTHER): Payer: Medicare Other

## 2011-02-15 DIAGNOSIS — K589 Irritable bowel syndrome without diarrhea: Secondary | ICD-10-CM

## 2011-02-15 DIAGNOSIS — M899 Disorder of bone, unspecified: Secondary | ICD-10-CM

## 2011-02-15 DIAGNOSIS — I10 Essential (primary) hypertension: Secondary | ICD-10-CM

## 2011-02-15 DIAGNOSIS — F411 Generalized anxiety disorder: Secondary | ICD-10-CM

## 2011-02-15 DIAGNOSIS — N318 Other neuromuscular dysfunction of bladder: Secondary | ICD-10-CM

## 2011-02-15 DIAGNOSIS — I872 Venous insufficiency (chronic) (peripheral): Secondary | ICD-10-CM

## 2011-02-15 DIAGNOSIS — K449 Diaphragmatic hernia without obstruction or gangrene: Secondary | ICD-10-CM

## 2011-02-15 DIAGNOSIS — E7800 Pure hypercholesterolemia, unspecified: Secondary | ICD-10-CM

## 2011-02-15 DIAGNOSIS — M1990 Unspecified osteoarthritis, unspecified site: Secondary | ICD-10-CM

## 2011-02-15 DIAGNOSIS — D126 Benign neoplasm of colon, unspecified: Secondary | ICD-10-CM

## 2011-02-15 DIAGNOSIS — E119 Type 2 diabetes mellitus without complications: Secondary | ICD-10-CM

## 2011-02-15 LAB — HEPATIC FUNCTION PANEL
AST: 22 U/L (ref 0–37)
Albumin: 4.2 g/dL (ref 3.5–5.2)
Alkaline Phosphatase: 74 U/L (ref 39–117)
Bilirubin, Direct: 0.1 mg/dL (ref 0.0–0.3)
Total Bilirubin: 0.6 mg/dL (ref 0.3–1.2)

## 2011-02-15 LAB — LIPID PANEL
Total CHOL/HDL Ratio: 4
VLDL: 23.2 mg/dL (ref 0.0–40.0)

## 2011-02-15 LAB — CBC WITH DIFFERENTIAL/PLATELET
Basophils Absolute: 0 10*3/uL (ref 0.0–0.1)
Eosinophils Absolute: 0.2 10*3/uL (ref 0.0–0.7)
HCT: 40.1 % (ref 36.0–46.0)
Hemoglobin: 13.4 g/dL (ref 12.0–15.0)
Lymphs Abs: 2.2 10*3/uL (ref 0.7–4.0)
MCHC: 33.4 g/dL (ref 30.0–36.0)
Monocytes Relative: 7.3 % (ref 3.0–12.0)
Neutro Abs: 2.6 10*3/uL (ref 1.4–7.7)
Platelets: 234 10*3/uL (ref 150.0–400.0)
RDW: 12.9 % (ref 11.5–14.6)

## 2011-02-15 LAB — BASIC METABOLIC PANEL
GFR: 78.42 mL/min (ref >60.00)
Glucose, Bld: 129 mg/dL — ABNORMAL HIGH (ref 70–99)
Potassium: 3.9 mEq/L (ref 3.5–5.1)
Sodium: 139 mEq/L (ref 135–145)

## 2011-02-15 LAB — TSH: TSH: 1.4 u[IU]/mL (ref 0.35–5.50)

## 2011-02-15 MED ORDER — CLORAZEPATE DIPOTASSIUM 7.5 MG PO TABS: 7.5000 mg | ORAL_TABLET | Freq: Three times a day (TID) | ORAL | Status: DC | PRN

## 2011-02-15 MED ORDER — MELOXICAM 7.5 MG PO TABS: 7.5000 mg | ORAL_TABLET | Freq: Every day | ORAL | Status: DC

## 2011-02-15 NOTE — Progress Notes (Signed)
Subjective:    Patient ID: Kimberly Aguirre, female    DOB: 09/12/1933, 77 y.o.   MRN: [id number]  HPI 77 y/o BF here for a follow up visit...  he has multiple medical problems as noted below...   ~  August 03, 2009:  c/o discomfort in left leg- prev burning in feet, but this has resolved & now a pain in groin area assoc w/ stabbing pain in thigh area... only prev hip films was pelvis XRay- neg in 2006... monofilament test = norm, sl decr ROM hip & prev right hip injection by DrGioffre in 2006>> we discussed Mobic, refer to Ortho for XRay & poss injection.  ~  February 13, 2010:  she saw DrGioffre for Ortho- "I'm his mystery pt" state he couldn't find anything & Mobic helps some... c/o excess urination c/w overactive bladder & we discussed trial of Toviaz for these symptoms... she has uterine prolapse as well & may need surg as she declines pessary Rx from GYN... she saw DrHecker 2/11 w/ mild diabetic retinopathy bilat... needs to ret for FASTING labs...   ~  August 17, 2010:  she had total vag hysterectomy & repair w/ ureteral sling from DrDickstein & Mody 12/11> bladder symptoms much improved...  she's been controlling BP & Lipids w/ diet alone, and BS is fair on diet +66meds (see below)... she has osteopenia followed by GYN but pt refuses bisphos therapy- on calcium, vits...  ~  February 15, 2011:  9mo ROV & c/o not feeling well, no energy etc; had pain in left groin area on & off w/ eval from GYN & given Ibuprofen which helped; she is under a lot of stress w/ a grand daughter & Tranxene refilled today...  Fasting labs rechecked> FLP ok on diet alone; BS129, A1c7.0 on Metform+Glimep; others ok...            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  ESSENTIAL HYPERTENSION, BORDERLINE (ICD-401.9) - controlled on diet... BP= 114/72 & denies HA, fatigue,  visual changes, CP, palipit, dizziness, syncope, dyspnea, edema, etc...   CHEST PAIN, ATYPICAL (ICD-786.59) - hx atypical CP 2010> sharp, right chest, noticed at night & not days, 2-4/10 severity, no assoc n/v/diaphoresis, resolves spont in a few minutes... baseline EKG= NSR, WNL.[name]. had NuclearStressTest Dec00 which was neg- no infarct or ischemia, EF= 77%... no known coronary dis, but mult risk factors= DM, Chol, +FamHx strokes... [facility] & walks regularly...   VENOUS INSUFFICIENCY (ICD-459.81) - follows low sodium diet, elevates legs, support hose... no swelling.  HYPERCHOLESTEROLEMIA (ICD-272.0) - on FISH OIL daily, she stopped her prev Simvastatin 20mg  on her own... refuses statin therapy due to pain and swelling in her ankles, and she just wants Diet Rx alone... ~  FLP 8/08 showed TChol 147, TG 102, HDL 35, LDL 92... ? on her simvastatin 20mg /d. ~  FLP 8/09 showed TChol 163, TG 129, HDL 29, LDL 109... she prefers diet alone- discussed! ~  FLP 8/10 showed TChol 211, Tg 187, HDL 41, LDL 146...offered Prav40 w/ hope fewer side effects. ~  1/11: she reports trial Prav40 but stopped this due to "cramping in joints, esp ankles"... ~  FLP 8/11 on diet alone showed TChol 189, TG 155, HDL 40, LDL 118... "I'm committed to diet & ex" ~  FLP 2/12 showed TChol  220, TG 182, HDL 44, LDL 157... she confirms her refusal of statin rx. ~  FLP 8/12 showed TChol 179, TG 116, HDL 47, LDL 109... Much improved on diet, continue same!  DIABETES MELLITUS (ICD-250.00) - on METFORMIN 500mg Bid & AMARYL 4mg /d...  ~  labs 12/08 showed BS=101, HgA1c=7.3.Marland KitchenMarland Kitchen rec to add Actos but pt didn't fill this Rx. ~  labs 8/09 showed BS= 146, HgA1c= 7.4.Marland KitchenMarland Kitchen rec incr Metform 2Bid + Amaryl 4mg /d...  **note- pt states intol to Metform 2Bid and had to decr back to 1Bid + the Glimep... ~  labs 2/10 showed BS= 53, A1c= 7.0.Marland KitchenMarland Kitchen rec same meds, better diet, may need decr Glimep. ~  labs 8/10 showed BS= 127, A1c= 7.0.[name]. rec> keep  same, better diet. ~  1/11: she reports BS at home all in the 100-140+ range... ~  labs 8/11 on Metfor500Bid+Glim4 showed BS= 151, A1c= 7.2 ~  labs 2/12 showed BS= 158, A1c= 7.2.Marland KitchenMarland Kitchen continue same + better diet. ~  Labs 8/12 showed BS= 129, A1c= 7.0.Marland KitchenMarland Kitchen Improved> same meds + diet!  HIATAL HERNIA (ICD-553.3) - last EGD was 1980 showing 2cmHH, mild esophagitis and gastritis...  IRRITABLE BOWEL SYNDROME (ICD-564.1) - she uses Miralax, Senakot as needed...  COLONIC POLYPS (ICD-211.3) - last colonoscopy 7/05 by DrBrodie showed 2-7mm polyps... f/u planned 18yrs.  OVERACTIVE BLADDER (ICD-596.51) - she notes symptoms c/w overactive bladder 8/11 & we discussed trial of Toviaz... symptoms much improved after surg 12/11 [facility] & ureteral sling) and Toviaz stopped...  DEGENERATIVE JOINT DISEASE (ICD-715.90) - right hip DJD per DrGioffre w/ shot in 2006... repeat eval Spring2011 and nothing found according to the pt "I'm his mystery pt" she says... takes MOBIC 7.5mg  Prn w/ some relief...  BACK PAIN, LUMBAR (ICD-724.2) - she prev had some leg pain at night, ?neuropathy and tried Lyrica... ~  1/11:  denies back pain etc...  OSTEOPENIA (ICD-733.90) - on OSCAL Bid + vits daily, she stopped Fosamax on her own & states intol to bisphosphonates...  ~  BMD 5/08 showed TScores -1.1 to -1.5.[name]. & VitD level=64 in 8/08... rec to start Fosamax- pt refused. ~  BMD 9/11 showed TScores -2.0 in Spine, and -1.9 in left FemNeck... pt refuses bisphos rx.  HEADACHE (ICD-784.0)  ANXIETY (ICD-300.00) - she uses CHLORAZEPATE 7.5mg  Prn; under stress w/ grand daughter; med refilled...  SCREENING STUDIES & HEALTH MAINT:  ~  NuclearStressTest 12/00 was neg for infarct or ischemia, EF=77%... ~  Pneumovax shot 2003 at age 85 ~  Tetanus shot 2004 w/ local inflamm reaction. ~  GYN= DrDickstein et al...   Past Surgical History  Procedure Date  . Eye surgery     age 60  . Vaginal hysterectomy     and ureteral sling 06/2010 by Dr.  Elvera Bicker    Outpatient Encounter Prescriptions as of 02/15/2011  Medication Sig Dispense Refill  . aspirin 81 MG tablet Take 81 mg by mouth daily.        . calcium-vitamin D (OSCAL WITH D) 500-200 MG-UNIT per tablet Take 1 tablet by mouth as needed.       . clorazepate (TRANXENE) 7.5 MG tablet Take 7.5 mg by mouth 3 (three) times daily as needed. For nerves        . fish oil-omega-3 fatty acids 1000 MG capsule Take 1 g by mouth daily.        [name] glimepiride (AMARYL) 4 MG tablet Take 4 mg by mouth daily before breakfast.        . ibuprofen (ADVIL,MOTRIN)  600 MG tablet as needed.      . meloxicam (MOBIC) 7.5 MG tablet Take 7.5 mg by mouth daily. As needed for arthritis pain       . metFORMIN (GLUCOPHAGE) 500 MG tablet Take 500 mg by mouth 2 (two) times daily with a meal.        . Multiple Vitamins-Minerals (CENTRUM SILVER PO) Take 1 tablet by mouth daily.        [name] DISCONTD: Cholecalciferol (VITAMIN D) 1000 UNITS capsule Take 1,000 Units by mouth daily.          Allergies  Allergen Reactions  . Codeine     REACTION: nausea    Current Medications, Allergies, Past Medical History, Past Surgical History, Family History, and Social History were reviewed in [employer] record.   Review of Systems    See HPI - all other systems neg except as noted...  The patient denies anorexia, fever, weight loss, weight gain, vision loss, decreased hearing, hoarseness, chest pain, syncope, dyspnea on exertion, peripheral edema, prolonged cough, headaches, hemoptysis, abdominal pain, melena, hematochezia, severe indigestion/heartburn, hematuria, incontinence, muscle weakness, suspicious skin lesions, transient blindness, difficulty walking, depression, unusual weight change, abnormal bleeding, enlarged lymph nodes, and angioedema.   Objective:   Physical Exam     WD, WN, 77 y/o BF in NAD... GENERAL:  Alert & oriented; pleasant & cooperative... HEENT:  [place]/AT, Ophthal per  DrHecker, EACs-clear, TMs-wnl, NOSE-clear, THROAT-clear & wnl. NECK:  Supple w/ fairROM; no JVD; normal carotid impulses w/o bruits; no thyromegaly or nodules palpated; no lymphadenopathy. CHEST:  Clear to P & A; without wheezes/ rales/ or rhonchi heard... HEART:  Regular Rhythm; without murmurs/ rubs/ or gallops detected... ABDOMEN:  Soft & nontender; normal bowel sounds; no organomegaly or masses palpated, no guarding, or rebound. ... EXT: without deformities, mod arthritic changes; no varicose veins/ +venous insuffic/ no edema. some decr ROM left hip... NEURO:  CN's intact; motor testing normal; sensory testing normal & monofilament test= norm; gait normal & balance OK. DERM:  No lesions noted; no rash etc...   Assessment & Plan:   HBP>  BP is well regulated on diet alone- she avoids sodium & has been working on wt reduction...  VI>  Stable, she knows to avoid sodium, elevate legs, wear support hose...  CHOL>  She refuses statin meds; fortunately her recent diet efforts have been rewarded w/ much better lipid numbers, keep up the good work...  DM>  On Metformin Bid & Glimep 4mg Qam, plus diet etc;  Labs are improved w/ BS 129, A1c 7.0> continue same & the diet efforts...  GI> HH, IBS, Polyps>  Stable, no changes...  DJD, LBP>  Stable on NSAID analgesics...  Osteopenia>  She still refuses Bisphos rx; continue calcium, MVI, Vit D supplementation...  Anxiety>  Under stress w/ grand daughter... Refilled her Tranxene today.

## 2011-02-15 NOTE — Patient Instructions (Signed)
Today we updated your med list in our EPIC system...    We refilled the meds you requested...  Today we did your follow up fasting blood work...    Please call the [facility] in a few days for your results...    Dial [id number] & when prompted enter your patient number followed by the # symbol...    Your patient number is:  [id number]#  Stay as active as possible...  Call for any problems...  Let's plan another routine follow up visit in 6 months.Marland KitchenMarland Kitchen

## 2011-02-17 ENCOUNTER — Encounter: Payer: Self-pay | Admitting: Pulmonary Disease

## 2011-02-27 ENCOUNTER — Other Ambulatory Visit: Payer: Self-pay | Admitting: Pulmonary Disease

## 2011-03-16 ENCOUNTER — Other Ambulatory Visit: Payer: Self-pay | Admitting: Pulmonary Disease

## 2011-03-22 ENCOUNTER — Telehealth: Payer: Self-pay | Admitting: Pulmonary Disease

## 2011-03-22 MED ORDER — GLUCOSE BLOOD VI STRP: ORAL_STRIP | Status: DC

## 2011-03-22 MED ORDER — ACCU-CHEK AVIVA PLUS W/DEVICE KIT: 1.0000 | PACK | Status: DC

## 2011-03-22 MED ORDER — ACCU-CHEK SOFT TOUCH LANCETS MISC: Status: DC

## 2011-03-22 NOTE — Telephone Encounter (Signed)
Order sent to pharm for the above requests.

## 2011-03-23 ENCOUNTER — Other Ambulatory Visit: Payer: Self-pay | Admitting: *Deleted

## 2011-03-23 MED ORDER — GLUCOSE BLOOD VI STRP: ORAL_STRIP | Status: DC

## 2011-03-23 MED ORDER — ACCU-CHEK SOFT TOUCH LANCETS MISC: Status: DC

## 2011-03-26 ENCOUNTER — Telehealth: Payer: Self-pay | Admitting: Pulmonary Disease

## 2011-03-26 MED ORDER — GLUCOSE BLOOD VI STRP: ORAL_STRIP | Status: DC

## 2011-03-26 NOTE — Telephone Encounter (Signed)
Pt stated that she only received #50 test strips and she test twice daily. Called and spoke with pharmacist and she advised due to sig having test once daily or as directed Medicare will only pay for the lesser or the "clear cut" of the directions. I called and informed pt of same and apologized for same. I also informed pt I have left an [employer] monitor sample at the front desk and it does has #10 test strips since she only received #50. Pt verbalized understanding. I called pharmacy back and changed order back to twice a day # 100 with 12 fills.

## 2011-04-20 ENCOUNTER — Telehealth: Payer: Self-pay | Admitting: Pulmonary Disease

## 2011-04-20 MED ORDER — ACCU-CHEK FASTCLIX LANCETS MISC: 1.0000 | Status: DC

## 2011-04-20 NOTE — Telephone Encounter (Signed)
I spoke with pt and she states she needed accu check fast clix lancets sent to pharmacy. She also states she needed test strips. I advised her she should already have refills at the pharmacy. Pt verbalized understanding and had no questions. rx sent for pt's lancets

## 2011-07-25 DIAGNOSIS — M79609 Pain in unspecified limb: Secondary | ICD-10-CM | POA: Diagnosis not present

## 2011-07-25 DIAGNOSIS — B351 Tinea unguium: Secondary | ICD-10-CM | POA: Diagnosis not present

## 2011-08-06 ENCOUNTER — Telehealth: Payer: Self-pay | Admitting: Pulmonary Disease

## 2011-08-06 MED ORDER — AZITHROMYCIN 250 MG PO TABS: ORAL_TABLET | ORAL | Status: AC

## 2011-08-06 NOTE — Telephone Encounter (Signed)
Called and spoke with pt and she stated that for 2 days she has been cold, weakness, headaches, head congestion but no fever.  Pt is requesting to have something called in for her.  SN please advise. Thanks  Allergies  Allergen Reactions  . Codeine     REACTION: nausea

## 2011-08-06 NOTE — Telephone Encounter (Signed)
Per SN---ok for pt to have zpak #1  Take as directed, mucinex otc 2 po bid with plenty of fluids, nasal saline spray   Spray each nostril every 1-2 hours while awake.  Called and spoke with pt and she is aware of SN recs .

## 2011-08-20 ENCOUNTER — Telehealth: Payer: Self-pay | Admitting: Pulmonary Disease

## 2011-08-20 DIAGNOSIS — E119 Type 2 diabetes mellitus without complications: Secondary | ICD-10-CM

## 2011-08-20 NOTE — Telephone Encounter (Signed)
Pt called back re: same. Kimberly Aguirre °

## 2011-08-20 NOTE — Telephone Encounter (Signed)
Per TP---ok for pt to have bmp and a1c.  Called and spoke with pt and she is aware that these labs have been placed in the computer for her.

## 2011-08-20 NOTE — Telephone Encounter (Signed)
Pt wanting to know if she will need labs done prior to her apt tomorrow. If so she would like to come in prior to apt to have these drawn. Please advise Dr. Kriste Basque, thanks

## 2011-08-21 ENCOUNTER — Ambulatory Visit (INDEPENDENT_AMBULATORY_CARE_PROVIDER_SITE_OTHER)
Admission: RE | Admit: 2011-08-21 | Discharge: 2011-08-21 | Disposition: A | Discharge status: Home / Self Care | Payer: Medicare Other | Source: Ambulatory Visit | Attending: Pulmonary Disease | Admitting: Pulmonary Disease

## 2011-08-21 ENCOUNTER — Ambulatory Visit (INDEPENDENT_AMBULATORY_CARE_PROVIDER_SITE_OTHER): Payer: Medicare Other | Admitting: Pulmonary Disease

## 2011-08-21 ENCOUNTER — Other Ambulatory Visit (INDEPENDENT_AMBULATORY_CARE_PROVIDER_SITE_OTHER): Payer: Medicare Other

## 2011-08-21 ENCOUNTER — Encounter: Payer: Self-pay | Admitting: Pulmonary Disease

## 2011-08-21 VITALS — BP 120/70 | HR 86 | Temp 97.2°F | Ht 61.0 in | Wt 131.4 lb

## 2011-08-21 DIAGNOSIS — I872 Venous insufficiency (chronic) (peripheral): Secondary | ICD-10-CM | POA: Diagnosis not present

## 2011-08-21 DIAGNOSIS — K589 Irritable bowel syndrome without diarrhea: Secondary | ICD-10-CM

## 2011-08-21 DIAGNOSIS — I10 Essential (primary) hypertension: Secondary | ICD-10-CM

## 2011-08-21 DIAGNOSIS — F411 Generalized anxiety disorder: Secondary | ICD-10-CM

## 2011-08-21 DIAGNOSIS — K449 Diaphragmatic hernia without obstruction or gangrene: Secondary | ICD-10-CM

## 2011-08-21 DIAGNOSIS — E119 Type 2 diabetes mellitus without complications: Secondary | ICD-10-CM | POA: Diagnosis not present

## 2011-08-21 DIAGNOSIS — M1990 Unspecified osteoarthritis, unspecified site: Secondary | ICD-10-CM

## 2011-08-21 DIAGNOSIS — N318 Other neuromuscular dysfunction of bladder: Secondary | ICD-10-CM

## 2011-08-21 DIAGNOSIS — M899 Disorder of bone, unspecified: Secondary | ICD-10-CM

## 2011-08-21 DIAGNOSIS — E7800 Pure hypercholesterolemia, unspecified: Secondary | ICD-10-CM

## 2011-08-21 DIAGNOSIS — D126 Benign neoplasm of colon, unspecified: Secondary | ICD-10-CM

## 2011-08-21 LAB — BASIC METABOLIC PANEL
BUN: 16 mg/dL (ref 6–23)
Chloride: 101 mEq/L (ref 96–112)
Creatinine, Ser: 0.7 mg/dL (ref 0.4–1.2)
GFR: 99.71 mL/min (ref >60.00)
Potassium: 3.8 mEq/L (ref 3.5–5.1)

## 2011-08-21 LAB — HEMOGLOBIN A1C: Hgb A1c MFr Bld: 7.2 % — ABNORMAL HIGH (ref 4.6–6.5)

## 2011-08-21 MED ORDER — ACCU-CHEK FASTCLIX LANCETS MISC: 1.0000 | Status: DC

## 2011-08-21 NOTE — Progress Notes (Signed)
Subjective:    Patient ID: Kimberly Aguirre, female    DOB: 1933-11-02, 78 y.o.   MRN: [id number]  HPI 78 y/o BF here for a follow up visit...  he has multiple medical problems as noted below...   ~  August 17, 2010:  she had total vag hysterectomy & repair w/ ureteral sling from DrDickstein & Mody 12/11> bladder symptoms much improved...  she's been controlling BP & Lipids w/ diet alone, and BS is fair on diet +93meds (see below)... she has osteopenia followed by GYN but pt refuses bisphos therapy- on calcium, vits...  ~  February 15, 2011:  43mo ROV & c/o not feeling well, no energy etc; had pain in left groin area on & off w/ eval from GYN & given Ibuprofen which helped; she is under a lot of stress w/ a grand daughter & Tranxene refilled today...  Fasting labs rechecked> FLP ok on diet alone; BS129, A1c7.0 on Metform+Glimep; others ok...  ~  August 21, 2011:  43mo ROV & she had recent URI treated w/ ZPak & improved; CXR was clear, NAD; had Fluvax 10/12...    <BorderlineBP> controlled on diet alone; BP= 120/70 & she denies CP, palpit, dizzy, SOB, edema, etc...    <Chol> on fish oil, and refused statin Rx prev; FLP 8/12 actually much improved on diet & she is encouraged to keep up the good work...    <DM> on Metform500Bid & Glimep4mg Qam; BS=196, A1c=7.2 and she may need 3rd med (intol to incr Metform)...    <GI- HH, IBS, Polyps> stable on OTC meds prn...    <GU- OB> symptoms much improved after her surg 12/11 (TVH, ureteral sling) & prev meds stopped...    <DJD, LBP, Osteopenia> on Mobic as needed; takes calcium, MVI, VitD; refuses bisphos Rx...            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 birth defect, she had eye surgery age 65, followed by DrHecker with mild bilat diabetic retinopathy reported 2/11 (she plans f/u in 43mo).  ESSENTIAL HYPERTENSION, BORDERLINE (ICD-401.9) - controlled on diet... BP= 114/72 & denies HA, fatigue, visual changes, CP, palipit, dizziness, syncope,  dyspnea, edema, etc... ~  CXR 2/13 showed heart at upper lim normal, min bronchitic changes, clear, NAD  CHEST PAIN, ATYPICAL (ICD-786.59) - hx atypical CP 2010> sharp, right chest, noticed at night & not days, 2-4/10 severity, no assoc n/v/diaphoresis, resolves spont in a few minutes... baseline EKG= NSR, WNL.[name]. had NuclearStressTest Dec00 which was neg- no infarct or ischemia, EF= 77%... no known coronary dis, but mult risk factors= DM, Chol, +FamHx strokes... [facility]...   VENOUS INSUFFICIENCY (ICD-459.81) - follows low sodium diet, elevates legs, support hose... no swelling.  HYPERCHOLESTEROLEMIA (ICD-272.0) - on FISH OIL daily, she stopped her prev Simvastatin 20mg  on her own... refuses statin therapy due to pain and swelling in her ankles, and she just wants Diet Rx alone... ~  FLP 8/08 showed TChol 147, TG 102, HDL 35, LDL 92... ? on her simvastatin 20mg /d. ~  FLP 8/09 showed TChol 163, TG 129, HDL 29, LDL 109... she prefers diet alone- discussed! ~  FLP 8/10 showed TChol 211, Tg 187, HDL 41, LDL 146...offered Prav40 w/ hope fewer side effects. ~  1/11: she reports trial Prav40 but stopped this due to "cramping in joints, esp ankles"... ~  FLP 8/11 on diet alone showed TChol 189, TG 155, HDL 40, LDL 118... "I'm committed  to diet & ex" ~  FLP 2/12 showed TChol 220, TG 182, HDL 44, LDL 157... she confirms her refusal of statin rx. ~  FLP 8/12 showed TChol 179, TG 116, HDL 47, LDL 109... Much improved on diet, continue same!  DIABETES MELLITUS (ICD-250.00) - on METFORMIN 500mg Bid & AMARYL 4mg /d...  ~  labs 12/08 showed BS=101, HgA1c=7.3.Marland KitchenMarland Kitchen rec to add Actos but pt didn't fill this Rx. ~  labs 8/09 showed BS= 146, HgA1c= 7.4.Marland KitchenMarland Kitchen rec incr Metform 2Bid + Amaryl 4mg /d...  ~  note- pt states intol to Metform 2Bid and had to decr back to 1Bid + the Glimep... ~  labs 2/10 showed BS= 53, A1c= 7.0.Marland KitchenMarland Kitchen rec same meds, better diet, may need decr Glimep. ~  labs 8/10 showed  BS= 127, A1c= 7.0.[name]. rec> keep same, better diet. ~  1/11: she reports BS at home all in the 100-140+ range... ~  labs 8/11 on Metfor500Bid+Glim4 showed BS= 151, A1c= 7.2 ~  labs 2/12 showed BS= 158, A1c= 7.2.Marland KitchenMarland Kitchen continue same + better diet. ~  Labs 8/12 showed BS= 129, A1c= 7.0.Marland KitchenMarland Kitchen Improved> same meds + diet! ~  Labs 2/13 on Metform500Bid+Glimep4mg  showed BS= 196, A1c= 7.2... Next step add 3rd med.  HIATAL HERNIA (ICD-553.3) - last EGD was 1980 showing 2cmHH, mild esophagitis and gastritis...  IRRITABLE BOWEL SYNDROME (ICD-564.1) - she uses Miralax, Senakot as needed...  COLONIC POLYPS (ICD-211.3) - last colonoscopy 7/05 by DrBrodie showed 2-18mm polyps... f/u planned 35yrs.  OVERACTIVE BLADDER (ICD-596.51) - she notes symptoms c/w overactive bladder 8/11 & we discussed trial of Toviaz... symptoms much improved after surg 12/11 [facility] & ureteral sling) and Toviaz stopped...  DEGENERATIVE JOINT DISEASE (ICD-715.90) - right hip DJD per DrGioffre w/ shot in 2006... repeat eval Spring2011 and nothing found according to the pt "I'm his mystery pt" she says... takes MOBIC 7.5mg  Prn w/ some relief...  BACK PAIN, LUMBAR (ICD-724.2) - she prev had some leg pain at night, ?neuropathy and tried Lyrica... ~  1/11:  denies back pain etc...  OSTEOPENIA (ICD-733.90) - on OSCAL Bid + vits daily, she stopped Fosamax on her own & states intol to bisphosphonates...  ~  BMD 5/08 showed TScores -1.1 to -1.5.[name]. & VitD level=64 in 8/08... rec to start Fosamax- pt refused. ~  BMD 9/11 showed TScores -2.0 in Spine, and -1.9 in left FemNeck... pt refuses bisphos rx.  HEADACHE (ICD-784.0)  ANXIETY (ICD-300.00) - she uses CHLORAZEPATE 7.5mg  Prn; under stress w/ grand daughter; med refilled...  SCREENING STUDIES & HEALTH MAINT:  ~  NuclearStressTest 12/00 was neg for infarct or ischemia, EF=77%... ~  Pneumovax shot 2003 at age 46 ~  Tetanus shot 2004 w/ local inflamm reaction. ~  GYN= DrDickstein et al...   Past  Surgical History  Procedure Date  . Eye surgery     age 83  . Vaginal hysterectomy     and ureteral sling 06/2010 by Dr. Elvera Bicker    Outpatient Encounter Prescriptions as of 08/21/2011  Medication Sig Dispense Refill  . ACCU-CHEK FASTCLIX LANCETS MISC 1 Device by Does not apply route as directed. Dx: 230.00  102 each  12  . aspirin 81 MG tablet Take 81 mg by mouth daily.        . Blood Glucose Monitoring Suppl (ACCU-CHEK AVIVA PLUS) W/DEVICE KIT 1 Device by Does not apply route as directed.  1 kit  0  . calcium-vitamin D (OSCAL WITH D) 500-200 MG-UNIT per tablet Take 1 tablet by mouth 2 times per week      .  clorazepate (TRANXENE) 7.5 MG tablet Take 1 tablet (7.5 mg total) by mouth 3 (three) times daily as needed. For nerves   90 tablet  5  . fish oil-omega-3 fatty acids 1000 MG capsule Take 1 g by mouth daily.        [name] glimepiride (AMARYL) 4 MG tablet take 1 tablet once daily every morning  30 tablet  11  . glucose blood (ACCU-CHEK AVIVA) test strip Check blood sugars up to twice a day or as directed DX: 250.00  100 each  12  . meloxicam (MOBIC) 7.5 MG tablet Take 1 tablet (7.5 mg total) by mouth daily. As needed for arthritis pain  30 tablet  11  . metFORMIN (GLUCOPHAGE) 500 MG tablet take 1 tablet by mouth twice a day  60 tablet  11  . Multiple Vitamins-Minerals (CENTRUM SILVER PO) Take 1 tablet by mouth daily.        [name] PRODIGY NO CODING BLOOD GLUC test strip TEST BLOOD SUGAR TWO TIMES A DAY  100 each  4  . DISCONTD: ibuprofen (ADVIL,MOTRIN) 600 MG tablet as needed.        Allergies  Allergen Reactions  . Codeine     REACTION: nausea    Current Medications, Allergies, Past Medical History, Past Surgical History, Family History, and Social History were reviewed in [employer] record.   Review of Systems    See HPI - all other systems neg except as noted...  The patient denies anorexia, fever, weight loss, weight gain, vision loss, decreased hearing,  hoarseness, chest pain, syncope, dyspnea on exertion, peripheral edema, prolonged cough, headaches, hemoptysis, abdominal pain, melena, hematochezia, severe indigestion/heartburn, hematuria, incontinence, muscle weakness, suspicious skin lesions, transient blindness, difficulty walking, depression, unusual weight change, abnormal bleeding, enlarged lymph nodes, and angioedema.   Objective:   Physical Exam     WD, WN, 77 y/o BF in NAD... GENERAL:  Alert & oriented; pleasant & cooperative... HEENT:  [place]/AT, Ophthal per DrHecker, EACs-clear, TMs-wnl, NOSE-clear, THROAT-clear & wnl. NECK:  Supple w/ fairROM; no JVD; normal carotid impulses w/o bruits; no thyromegaly or nodules palpated; no lymphadenopathy. CHEST:  Clear to P & A; without wheezes/ rales/ or rhonchi heard... HEART:  Regular Rhythm; without murmurs/ rubs/ or gallops detected... ABDOMEN:  Soft & nontender; normal bowel sounds; no organomegaly or masses palpated, no guarding, or rebound. ... EXT: without deformities, mod arthritic changes; no varicose veins/ +venous insuffic/ no edema. some decr ROM left hip... NEURO:  CN's intact; motor testing normal; sensory testing normal & monofilament test= norm; gait normal & balance OK. DERM:  No lesions noted; no rash etc...  RADIOLOGY DATA:  Reviewed in the EPIC EMR & discussed w/ the patient...  LABORATORY DATA:  Reviewed in the EPIC EMR & discussed w/ the patient...   Assessment & Plan:   HBP>  BP is well regulated on diet alone- she avoids sodium & has been working on wt reduction...  VI>  Stable, she knows to avoid sodium, elevate legs, wear support hose...  CHOL>  She refuses statin meds; fortunately her recent diet efforts have been rewarded w/ much better lipid numbers, keep up the good work...  DM>  On Metformin Bid & Glimep 4mg Qam, plus diet etc;  Labs are improved w/ BS=196, A1c 7.2> looks like she'll need 3rd med but wants to wait...  GI> HH, IBS, Polyps>  Stable, no  changes...  DJD, LBP>  Stable on NSAID analgesics...  Osteopenia>  She still refuses Bisphos rx;  continue calcium, MVI, Vit D supplementation...  Anxiety>  Under stress w/ grand daughter... Refilled her Tranxene today.   Patient's Medications  New Prescriptions   No medications on file  Previous Medications   ASPIRIN 81 MG TABLET    Take 81 mg by mouth daily.     BLOOD GLUCOSE MONITORING SUPPL (ACCU-CHEK AVIVA PLUS) W/DEVICE KIT    1 Device by Does not apply route as directed.   CALCIUM-VITAMIN D (OSCAL WITH D) 500-200 MG-UNIT PER TABLET    Take 1 tablet by mouth 2 times per week   CLORAZEPATE (TRANXENE) 7.5 MG TABLET    Take 1 tablet (7.5 mg total) by mouth 3 (three) times daily as needed. For nerves    FISH OIL-OMEGA-3 FATTY ACIDS 1000 MG CAPSULE    Take 1 g by mouth daily.     GLIMEPIRIDE (AMARYL) 4 MG TABLET    take 1 tablet once daily every morning   GLUCOSE BLOOD (ACCU-CHEK AVIVA) TEST STRIP    Check blood sugars up to twice a day or as directed DX: 250.00   MELOXICAM (MOBIC) 7.5 MG TABLET    Take 1 tablet (7.5 mg total) by mouth daily. As needed for arthritis pain   METFORMIN (GLUCOPHAGE) 500 MG TABLET    take 1 tablet by mouth twice a day   MULTIPLE VITAMINS-MINERALS (CENTRUM SILVER PO)    Take 1 tablet by mouth daily.     PRODIGY NO CODING BLOOD GLUC TEST STRIP    TEST BLOOD SUGAR TWO TIMES A DAY  Modified Medications   Modified Medication Previous Medication   ACCU-CHEK FASTCLIX LANCETS MISC ACCU-CHEK FASTCLIX LANCETS MISC      1 Device by Does not apply route as directed. Dx: 230.00    1 Device by Does not apply route as directed. Dx: 230.00  Discontinued Medications   IBUPROFEN (ADVIL,MOTRIN) 600 MG TABLET    as needed.

## 2011-08-21 NOTE — Patient Instructions (Signed)
Today we updated your med list in our EPIC system...    Continue your current medications the same...  We recommended VIACTIVE as a calcium source take 1-2 daily...  Today we did your follow up CXR & targeted blood work...    Please call the PHONE TREE in a few days for your results...    Dial [id number] & when prompted enter your patient number followed by the # symbol...    Your patient number is:  [id number]#  Be sure to eat a little something at each scheduled mealtime...  Call for any questions...  Let's plan another follow up visit in 6 months.Marland KitchenMarland Kitchen

## 2011-08-23 DIAGNOSIS — Z124 Encounter for screening for malignant neoplasm of cervix: Secondary | ICD-10-CM | POA: Diagnosis not present

## 2011-10-01 DIAGNOSIS — Z961 Presence of intraocular lens: Secondary | ICD-10-CM | POA: Diagnosis not present

## 2011-10-01 DIAGNOSIS — H40019 Open angle with borderline findings, low risk, unspecified eye: Secondary | ICD-10-CM | POA: Diagnosis not present

## 2011-10-01 DIAGNOSIS — H2510 Age-related nuclear cataract, unspecified eye: Secondary | ICD-10-CM | POA: Diagnosis not present

## 2011-10-01 DIAGNOSIS — H43399 Other vitreous opacities, unspecified eye: Secondary | ICD-10-CM | POA: Diagnosis not present

## 2011-10-01 DIAGNOSIS — E119 Type 2 diabetes mellitus without complications: Secondary | ICD-10-CM | POA: Diagnosis not present

## 2011-10-16 ENCOUNTER — Other Ambulatory Visit: Payer: Self-pay | Admitting: Pulmonary Disease

## 2011-10-30 ENCOUNTER — Ambulatory Visit: Payer: Medicare Other | Admitting: Pulmonary Disease

## 2011-11-06 ENCOUNTER — Ambulatory Visit (INDEPENDENT_AMBULATORY_CARE_PROVIDER_SITE_OTHER): Payer: Medicare Other | Admitting: Adult Health

## 2011-11-06 ENCOUNTER — Encounter: Payer: Self-pay | Admitting: Adult Health

## 2011-11-06 VITALS — BP 134/70 | HR 92 | Temp 97.2°F | Ht 61.0 in | Wt 131.2 lb

## 2011-11-06 DIAGNOSIS — M1990 Unspecified osteoarthritis, unspecified site: Secondary | ICD-10-CM | POA: Diagnosis not present

## 2011-11-06 NOTE — Progress Notes (Signed)
Subjective:    Patient ID: Kimberly Aguirre, female    DOB: [birth year], 78 y.o.   MRN: [id number]  HPI 78 y/o BF  ~  August 17, 2010:  she had total vag hysterectomy & repair w/ ureteral sling from DrDickstein & Mody 12/11> bladder symptoms much improved...  she's been controlling BP & Lipids w/ diet alone, and BS is fair on diet +41meds (see below)... she has osteopenia followed by GYN but pt refuses bisphos therapy- on calcium, vits...  ~  February 15, 2011:  69mo ROV & c/o not feeling well, no energy etc; had pain in left groin area on & off w/ eval from GYN & given Ibuprofen which helped; she is under a lot of stress w/ a grand daughter & Tranxene refilled today...  Fasting labs rechecked> FLP ok on diet alone; BS129, A1c7.0 on Metform+Glimep; others ok...  ~  August 21, 2011:  69mo ROV & she had recent URI treated w/ ZPak & improved; CXR was clear, NAD; had Fluvax 10/12...    <BorderlineBP> controlled on diet alone; BP= 120/70 & she denies CP, palpit, dizzy, SOB, edema, etc...    <Chol> on fish oil, and refused statin Rx prev; FLP 8/12 actually much improved on diet & she is encouraged to keep up the good work...    <DM> on Metform500Bid & Glimep4mg Qam; BS=196, A1c=7.2 and she may need 3rd med (intol to incr Metform)...    <GI- HH, IBS, Polyps> stable on OTC meds prn...    <GU- OB> symptoms much improved after her surg 12/11 (TVH, ureteral sling) & prev meds stopped...    <DJD, LBP, Osteopenia> on Mobic as needed; takes calcium, MVI, VitD; refuses bisphos Rx...  11/06/2011 Acute OV  Complains of pain in left hip/groin and left leg pain, stiffness, sore especially after prolonged sitting .  Has used Mobic in the past with some relief. But does not take Mobic every day. She denies any known injury. Leg weakness, chest pain, shortness of breath, syncope, edema. Patient has been seen by orthopedics in the past for this problem. She says over the last few months. It seems to be getting worse,  especially during stressful times.               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 birth defect, she had eye surgery age 67, followed by DrHecker with mild bilat diabetic retinopathy reported 2/11 (she plans f/u in 69mo).  ESSENTIAL HYPERTENSION, BORDERLINE (ICD-401.9) - controlled on diet... BP= 114/72 & denies HA, fatigue, visual changes, CP, palipit, dizziness, syncope, dyspnea, edema, etc... ~  CXR 2/13 showed heart at upper lim normal, min bronchitic changes, clear, NAD  CHEST PAIN, ATYPICAL (ICD-786.59) - hx atypical CP 2010> sharp, right chest, noticed at night & not days, 2-4/10 severity, no assoc n/v/diaphoresis, resolves spont in a few minutes... baseline EKG= NSR, WNL.[name]. had NuclearStressTest Dec00 which was neg- no infarct or ischemia, EF= 77%... no known coronary dis, but mult risk factors= DM, Chol, +FamHx strokes... [facility]...   VENOUS INSUFFICIENCY (ICD-459.81) - follows low sodium diet, elevates legs, support hose... no swelling.  HYPERCHOLESTEROLEMIA (ICD-272.0) - on FISH OIL daily, she stopped her prev Simvastatin 20mg  on her own... refuses statin therapy due to pain and swelling in her ankles, and she just wants Diet Rx alone... ~  FLP 8/08 showed TChol 147, TG 102, HDL 35, LDL 92... ? on her simvastatin 20mg /d. ~  FLP  8/09 showed TChol 163, TG 129, HDL 29, LDL 109... she prefers diet alone- discussed! ~  FLP 8/10 showed TChol 211, Tg 187, HDL 41, LDL 146...offered Prav40 w/ hope fewer side effects. ~  1/11: she reports trial Prav40 but stopped this due to "cramping in joints, esp ankles"... ~  FLP 8/11 on diet alone showed TChol 189, TG 155, HDL 40, LDL 118... "I'm committed to diet & ex" ~  FLP 2/12 showed TChol 220, TG 182, HDL 44, LDL 157... she confirms her refusal of statin rx. ~  FLP 8/12 showed TChol 179, TG 116, HDL 47, LDL 109... Much improved on diet, continue same!  DIABETES MELLITUS (ICD-250.00) - on  METFORMIN 500mg Bid & AMARYL 4mg /d...  ~  labs 12/08 showed BS=101, HgA1c=7.3.Marland KitchenMarland Kitchen rec to add Actos but pt didn't fill this Rx. ~  labs 8/09 showed BS= 146, HgA1c= 7.4.Marland KitchenMarland Kitchen rec incr Metform 2Bid + Amaryl 4mg /d...  ~  note- pt states intol to Metform 2Bid and had to decr back to 1Bid + the Glimep... ~  labs 2/10 showed BS= 53, A1c= 7.0.Marland KitchenMarland Kitchen rec same meds, better diet, may need decr Glimep. ~  labs 8/10 showed BS= 127, A1c= 7.0.[name]. rec> keep same, better diet. ~  1/11: she reports BS at home all in the 100-140+ range... ~  labs 8/11 on Metfor500Bid+Glim4 showed BS= 151, A1c= 7.2 ~  labs 2/12 showed BS= 158, A1c= 7.2.Marland KitchenMarland Kitchen continue same + better diet. ~  Labs 8/12 showed BS= 129, A1c= 7.0.Marland KitchenMarland Kitchen Improved> same meds + diet! ~  Labs 2/13 on Metform500Bid+Glimep4mg  showed BS= 196, A1c= 7.2... Next step add 3rd med.  HIATAL HERNIA (ICD-553.3) - last EGD was 1980 showing 2cmHH, mild esophagitis and gastritis...  IRRITABLE BOWEL SYNDROME (ICD-564.1) - she uses Miralax, Senakot as needed...  COLONIC POLYPS (ICD-211.3) - last colonoscopy 7/05 by DrBrodie showed 2-27mm polyps... f/u planned 66yrs.  OVERACTIVE BLADDER (ICD-596.51) - she notes symptoms c/w overactive bladder 8/11 & we discussed trial of Toviaz... symptoms much improved after surg 12/11 [facility] & ureteral sling) and Toviaz stopped...  DEGENERATIVE JOINT DISEASE (ICD-715.90) - right hip DJD per DrGioffre w/ shot in 2006... repeat eval Spring2011 and nothing found according to the pt "I'm his mystery pt" she says... takes MOBIC 7.5mg  Prn w/ some relief...  BACK PAIN, LUMBAR (ICD-724.2) - she prev had some leg pain at night, ?neuropathy and tried Lyrica... ~  1/11:  denies back pain etc...  OSTEOPENIA (ICD-733.90) - on OSCAL Bid + vits daily, she stopped Fosamax on her own & states intol to bisphosphonates...  ~  BMD 5/08 showed TScores -1.1 to -1.5.[name]. & VitD level=64 in 8/08... rec to start Fosamax- pt refused. ~  BMD 9/11 showed TScores -2.0 in Spine, and  -1.9 in left FemNeck... pt refuses bisphos rx.  HEADACHE (ICD-784.0)  ANXIETY (ICD-300.00) - she uses CHLORAZEPATE 7.5mg  Prn; under stress w/ grand daughter; med refilled...  SCREENING STUDIES & HEALTH MAINT:  ~  NuclearStressTest 12/00 was neg for infarct or ischemia, EF=77%... ~  Pneumovax shot 2003 at age 28 ~  Tetanus shot 2004 w/ local inflamm reaction. ~  GYN= DrDickstein et al...   Past Surgical History  Procedure Date  . Eye surgery     age 21  . Vaginal hysterectomy     and ureteral sling 06/2010 by Dr. Elvera Bicker    Outpatient Encounter Prescriptions as of 11/06/2011  Medication Sig Dispense Refill  . ACCU-CHEK FASTCLIX LANCETS MISC 1 Device by Does not apply route as directed. Dx:  230.00  102 each  11  . aspirin 81 MG tablet Take 81 mg by mouth daily.        . Blood Glucose Monitoring Suppl (ACCU-CHEK AVIVA PLUS) W/DEVICE KIT 1 Device by Does not apply route as directed.  1 kit  0  . calcium-vitamin D (OSCAL WITH D) 500-200 MG-UNIT per tablet Take 1 tablet by mouth 2 times per week      . clorazepate (TRANXENE) 7.5 MG tablet take 1 tablet by mouth three times a day if needed for nerves  90 tablet  5  . fish oil-omega-3 fatty acids 1000 MG capsule Take 1 g by mouth daily.        [name] glimepiride (AMARYL) 4 MG tablet take 1 tablet once daily every morning  30 tablet  11  . glucose blood (ACCU-CHEK AVIVA) test strip Check blood sugars up to twice a day or as directed DX: 250.00  100 each  12  . meloxicam (MOBIC) 7.5 MG tablet Take 1 tablet (7.5 mg total) by mouth daily. As needed for arthritis pain  30 tablet  11  . metFORMIN (GLUCOPHAGE) 500 MG tablet take 1 tablet by mouth twice a day  60 tablet  11  . Multiple Vitamins-Minerals (CENTRUM SILVER PO) Take 1 tablet by mouth daily.        [name] PRODIGY NO CODING BLOOD GLUC test strip TEST BLOOD SUGAR TWO TIMES A DAY  100 each  4    Allergies  Allergen Reactions  . Codeine     REACTION: nausea    Current Medications,  Allergies, Past Medical History, Past Surgical History, Family History, and Social History were reviewed in [employer] record.   Review of Systems    Constitutional:   No  weight loss, night sweats,  Fevers, chills, fatigue, or  lassitude.  HEENT:   No headaches,  Difficulty swallowing,  Tooth/dental problems, or  Sore throat,                No sneezing, itching, ear ache, nasal congestion, post nasal drip,   CV:  No chest pain,  Orthopnea, PND, swelling in lower extremities, anasarca, dizziness, palpitations, syncope.   GI  No heartburn, indigestion, abdominal pain, nausea, vomiting, diarrhea, change in bowel habits, loss of appetite, bloody stools.   Resp: No shortness of breath with exertion or at rest.  No excess mucus, no productive cough,  No non-productive cough,  No coughing up of blood.  No change in color of mucus.  No wheezing.  No chest wall deformity  Skin: no rash or lesions.  GU: no dysuria, change in color of urine, no urgency or frequency.  No flank pain, no hematuria   MS:  + joint pain  Neg for  swelling.    No back pain.  Psych:  No change in mood or affect. No depression or anxiety.  No memory loss.       Objective:   Physical Exam     WD, WN, 77 y/o BF in NAD... GENERAL:  Alert & oriented; pleasant & cooperative... HEENT:  [place]/AT, EACs-clear, TMs-wnl, NOSE-clear, THROAT-clear & wnl. NECK:  Supple w/ fairROM; no JVD; normal carotid impulses w/o bruits; no thyromegaly or nodules palpated; no lymphadenopathy. CHEST:  Clear to P & A; without wheezes/ rales/ or rhonchi heard... HEART:  Regular Rhythm; without murmurs/ rubs/ or gallops detected... ABDOMEN:  Soft & nontender; normal bowel sounds; no organomegaly or masses palpated, no guarding, or rebound. ... EXT:  without deformities, mod arthritic changes; no varicose veins/ +venous insuffic/ no edema. some decr ROM left hip...neg SLR, DTR 2+, equal strength bilaterally Pulses 2+  throughout, no bruits noted NEURO:  CN's intact; motor testing normal; sensory testing normal   norm; gait normal & balance OK. DERM:  No lesions noted; no rash etc...     Assessment & Plan:

## 2011-11-06 NOTE — Patient Instructions (Signed)
Take Meloxicam 7.5mg  daily with food. Take most days for joint pain Alternate ice and heat to hips As needed   If not improving call back will need referral to ortho  Please contact office for sooner follow up if symptoms do not improve or worsen or seek emergency care

## 2011-11-06 NOTE — Assessment & Plan Note (Signed)
Suspected flare of OA in left hip/groin   Plan:  Take Meloxicam 7.5mg  daily with food. Take most days for joint pain Alternate ice and heat to hips As needed   If not improving call back will need referral to ortho  Please contact office for sooner follow up if symptoms do not improve or worsen or seek emergency care

## 2011-11-23 ENCOUNTER — Other Ambulatory Visit: Payer: Self-pay | Admitting: Pulmonary Disease

## 2011-11-23 DIAGNOSIS — Z1231 Encounter for screening mammogram for malignant neoplasm of breast: Secondary | ICD-10-CM

## 2011-12-18 ENCOUNTER — Ambulatory Visit
Admission: RE | Admit: 2011-12-18 | Discharge: 2011-12-18 | Disposition: A | Discharge status: Home / Self Care | Payer: Medicare Other | Source: Ambulatory Visit | Attending: Pulmonary Disease | Admitting: Pulmonary Disease

## 2011-12-18 DIAGNOSIS — Z1231 Encounter for screening mammogram for malignant neoplasm of breast: Secondary | ICD-10-CM | POA: Diagnosis not present

## 2012-01-28 ENCOUNTER — Telehealth: Payer: Self-pay | Admitting: Pulmonary Disease

## 2012-01-28 MED ORDER — AZITHROMYCIN 250 MG PO TABS: ORAL_TABLET | ORAL | Status: AC

## 2012-01-28 NOTE — Telephone Encounter (Signed)
Called, spoke with pt who c/o dry cough, scratchy throat, sore throat from coughing, and runny nose.  Symptoms started yesterday.  Denies PND, SOB, wheezing, chest tightness, or congestion.  Has not tried anything otc -- Requesting rx to be called in.  Dr. Kriste Basque, pls advise.  Thank you.   [facility]  Allergies verified:  Allergies  Allergen Reactions  . Codeine     REACTION: nausea

## 2012-01-28 NOTE — Telephone Encounter (Signed)
Per SN---ok to call in zpak #1  Take as directed with no refills.  thanks 

## 2012-01-28 NOTE — Telephone Encounter (Signed)
Rx for zpack was sent. Pt aware.

## 2012-02-18 ENCOUNTER — Other Ambulatory Visit: Payer: Self-pay | Admitting: Pulmonary Disease

## 2012-02-18 ENCOUNTER — Telehealth: Payer: Self-pay | Admitting: Pulmonary Disease

## 2012-02-18 DIAGNOSIS — D126 Benign neoplasm of colon, unspecified: Secondary | ICD-10-CM

## 2012-02-18 DIAGNOSIS — E119 Type 2 diabetes mellitus without complications: Secondary | ICD-10-CM

## 2012-02-18 DIAGNOSIS — M899 Disorder of bone, unspecified: Secondary | ICD-10-CM

## 2012-02-18 DIAGNOSIS — I10 Essential (primary) hypertension: Secondary | ICD-10-CM

## 2012-02-18 DIAGNOSIS — F411 Generalized anxiety disorder: Secondary | ICD-10-CM

## 2012-02-18 DIAGNOSIS — E7800 Pure hypercholesterolemia, unspecified: Secondary | ICD-10-CM

## 2012-02-18 DIAGNOSIS — N318 Other neuromuscular dysfunction of bladder: Secondary | ICD-10-CM

## 2012-02-18 NOTE — Telephone Encounter (Signed)
Pt is wanting to know if she will need fasting blood work done tomorrow for her 6 month f/u appt. She states if she does she would like to have this done prior to her appt. Please advise Dr. Kriste Basque, thanks

## 2012-02-18 NOTE — Telephone Encounter (Signed)
Pt aware of this and nothing further was needed

## 2012-02-18 NOTE — Telephone Encounter (Signed)
Lab order has been placed in the computer for the pt.  Please advise pt that these are fasting labs.  thanks

## 2012-02-19 ENCOUNTER — Ambulatory Visit (INDEPENDENT_AMBULATORY_CARE_PROVIDER_SITE_OTHER): Payer: Medicare Other | Admitting: Pulmonary Disease

## 2012-02-19 ENCOUNTER — Encounter: Payer: Self-pay | Admitting: Pulmonary Disease

## 2012-02-19 ENCOUNTER — Other Ambulatory Visit (INDEPENDENT_AMBULATORY_CARE_PROVIDER_SITE_OTHER): Payer: Medicare Other

## 2012-02-19 VITALS — BP 112/68 | HR 63 | Temp 96.9°F | Ht 61.0 in | Wt 127.0 lb

## 2012-02-19 DIAGNOSIS — M899 Disorder of bone, unspecified: Secondary | ICD-10-CM

## 2012-02-19 DIAGNOSIS — E7800 Pure hypercholesterolemia, unspecified: Secondary | ICD-10-CM

## 2012-02-19 DIAGNOSIS — F411 Generalized anxiety disorder: Secondary | ICD-10-CM | POA: Diagnosis not present

## 2012-02-19 DIAGNOSIS — M79609 Pain in unspecified limb: Secondary | ICD-10-CM | POA: Diagnosis not present

## 2012-02-19 DIAGNOSIS — E119 Type 2 diabetes mellitus without complications: Secondary | ICD-10-CM | POA: Diagnosis not present

## 2012-02-19 DIAGNOSIS — I10 Essential (primary) hypertension: Secondary | ICD-10-CM

## 2012-02-19 DIAGNOSIS — D126 Benign neoplasm of colon, unspecified: Secondary | ICD-10-CM

## 2012-02-19 DIAGNOSIS — K589 Irritable bowel syndrome without diarrhea: Secondary | ICD-10-CM

## 2012-02-19 DIAGNOSIS — M949 Disorder of cartilage, unspecified: Secondary | ICD-10-CM

## 2012-02-19 DIAGNOSIS — I872 Venous insufficiency (chronic) (peripheral): Secondary | ICD-10-CM

## 2012-02-19 DIAGNOSIS — B351 Tinea unguium: Secondary | ICD-10-CM | POA: Diagnosis not present

## 2012-02-19 DIAGNOSIS — M545 Low back pain, unspecified: Secondary | ICD-10-CM

## 2012-02-19 DIAGNOSIS — M1990 Unspecified osteoarthritis, unspecified site: Secondary | ICD-10-CM

## 2012-02-19 DIAGNOSIS — K449 Diaphragmatic hernia without obstruction or gangrene: Secondary | ICD-10-CM

## 2012-02-19 DIAGNOSIS — N318 Other neuromuscular dysfunction of bladder: Secondary | ICD-10-CM

## 2012-02-19 LAB — BASIC METABOLIC PANEL
BUN: 14 mg/dL (ref 6–23)
CO2: 28 mEq/L (ref 19–32)
GFR: 96.52 mL/min (ref >60.00)
Glucose, Bld: 153 mg/dL — ABNORMAL HIGH (ref 70–99)
Potassium: 3.8 mEq/L (ref 3.5–5.1)

## 2012-02-19 LAB — HEPATIC FUNCTION PANEL
ALT: 19 U/L (ref 0–35)
AST: 26 U/L (ref 0–37)
Albumin: 4 g/dL (ref 3.5–5.2)
Total Protein: 7.5 g/dL (ref 6.0–8.3)

## 2012-02-19 LAB — URINALYSIS, ROUTINE W REFLEX MICROSCOPIC
Bilirubin Urine: NEGATIVE
Hgb urine dipstick: NEGATIVE
Ketones, ur: NEGATIVE
Nitrite: NEGATIVE

## 2012-02-19 LAB — HEMOGLOBIN A1C: Hgb A1c MFr Bld: 7.1 % — ABNORMAL HIGH (ref 4.6–6.5)

## 2012-02-19 LAB — CBC WITH DIFFERENTIAL/PLATELET
Basophils Absolute: 0 10*3/uL (ref 0.0–0.1)
Eosinophils Relative: 1.2 % (ref 0.0–5.0)
Lymphocytes Relative: 53.9 % — ABNORMAL HIGH (ref 12.0–46.0)
Monocytes Relative: 5.5 % (ref 3.0–12.0)
Neutrophils Relative %: 38.8 % — ABNORMAL LOW (ref 43.0–77.0)
Platelets: 230 10*3/uL (ref 150.0–400.0)
RDW: 13.3 % (ref 11.5–14.6)
WBC: 4.6 10*3/uL (ref 4.5–10.5)

## 2012-02-19 LAB — TSH: TSH: 1 u[IU]/mL (ref 0.35–5.50)

## 2012-02-19 LAB — LIPID PANEL
Cholesterol: 177 mg/dL (ref 0–200)
VLDL: 18.6 mg/dL (ref 0.0–40.0)

## 2012-02-19 NOTE — Progress Notes (Signed)
Subjective:    Patient ID: Kimberly Aguirre, female    DOB: 03-16-1934, 78 y.o.   MRN: [id number]  HPI 78 y/o BF here for a follow up visit...  he has multiple medical problems as noted below...   ~  August 17, 2010:  she had total vag hysterectomy & repair w/ ureteral sling from DrDickstein & Mody 12/11> bladder symptoms much improved...  she's been controlling BP & Lipids w/ diet alone, and BS is fair on diet +51meds (see below)... she has osteopenia followed by GYN but pt refuses bisphos therapy- on calcium, vits...  ~  February 15, 2011:  60mo ROV & c/o not feeling well, no energy etc; had pain in left groin area on & off w/ eval from GYN & given Ibuprofen which helped; she is under a lot of stress w/ a grand daughter & Tranxene refilled today...  Fasting labs rechecked> FLP ok on diet alone; BS129, A1c7.0 on Metform+Glimep; others ok...  ~  August 21, 2011:  60mo ROV & she had recent URI treated w/ ZPak & improved; CXR was clear, NAD; had Fluvax 10/12...    <BorderlineBP> controlled on diet alone; BP= 120/70 & she denies CP, palpit, dizzy, SOB, edema, etc...    <Chol> on fish oil, and refused statin Rx prev; FLP 8/12 actually much improved on diet & she is encouraged to keep up the good work...    <DM> on Metform500Bid & Glimep4mg Qam; BS=196, A1c=7.2 and she may need 3rd med (intol to incr Metform)...    <GI- HH, IBS, Polyps> stable on OTC meds prn...    <GU- OB> symptoms much improved after her surg 12/11 (TVH, ureteral sling) & prev meds stopped...    <DJD, LBP, Osteopenia> on Mobic as needed; takes calcium, MVI, VitD; refuses bisphos Rx...  ~  February 19, 2012:  60mo ROV & Kimberly Aguirre has some diffuse aches & pains- eg left arm & left groin area; she says "my left side is my weak side", walking helps, about the same on Mobic, & she notes that "being around other people helps"...  She is also c/o some stress w/ granddaughter & she notes BS is up when she's stressed...  Weight is down 5# to 127# today &  labs showed BS=153, A1c=7.1 on Metform+Amaryl...     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 birth defect, she had eye surgery age 28, followed by DrHecker with mild bilat diabetic retinopathy reported 2/11 (she plans f/u in 60mo).  ESSENTIAL HYPERTENSION, BORDERLINE (ICD-401.9) - controlled on diet... ~  2/13:  BP= 114/72 & denies HA, fatigue, visual changes, CP, palipit, dizziness, syncope, dyspnea, edema, etc... ~  CXR 2/13 showed heart at upper lim normal, min bronchitic changes, clear, NAD ~  8/13:  BP= 112/68 & she denies CP, palpit, SOB, edema, etc;  K=3.8, BUN=14, Creat=0.8  CHEST PAIN, ATYPICAL (ICD-786.59) - hx atypical CP 2010> sharp, right chest, noticed at night & not days, 2-4/10 severity, no assoc n/v/diaphoresis, resolves spont in a few minutes... baseline EKG= NSR, WNL.[name]. had NuclearStressTest Dec00 which was neg- no infarct or ischemia, EF= 77%... no known coronary dis, but mult risk factors= DM, Chol, +FamHx strokes... [facility] &  [facility] regularly...   VENOUS INSUFFICIENCY (ICD-459.81) - follows low sodium diet, elevates legs, support hose... no swelling.  HYPERCHOLESTEROLEMIA (ICD-272.0) - on FISH OIL daily, she stopped her prev Simvastatin 20mg  on her own... refuses statin therapy due to pain and swelling in her ankles, and she just wants Diet Rx alone... ~  FLP 8/08 showed TChol 147, TG 102, HDL 35, LDL 92... ? on her simvastatin 20mg /d. ~  FLP 8/09 showed TChol 163, TG 129, HDL 29, LDL 109... she prefers diet alone- discussed! ~  FLP 8/10 showed TChol 211, Tg 187, HDL 41, LDL 146...offered Prav40 w/ hope fewer side effects. ~  1/11: she reports trial Prav40 but stopped this due to "cramping in joints, esp ankles"... ~  FLP 8/11 on diet alone showed TChol 189, TG 155, HDL 40,  LDL 118... "I'm committed to diet & ex" ~  FLP 2/12 showed TChol 220, TG 182, HDL 44, LDL 157... she confirms her refusal of statin rx. ~  FLP 8/12 showed TChol 179, TG 116, HDL 47, LDL 109... Much improved on diet, continue same! ~  FLP 8/13 showed TChol 177, TG 93, HDL 49, LDL 109  DIABETES MELLITUS (ICD-250.00) - on METFORMIN 500mg Bid & AMARYL 4mg /d...  ~  labs 12/08 showed BS=101, HgA1c=7.3.Marland KitchenMarland Kitchen rec to add Actos but pt didn't fill this Rx. ~  labs 8/09 showed BS= 146, HgA1c= 7.4.Marland KitchenMarland Kitchen rec incr Metform 2Bid + Amaryl 4mg /d...  ~  note- pt states intol to Metform 2Bid and had to decr back to 1Bid + the Glimep... ~  labs 2/10 showed BS= 53, A1c= 7.0.Marland KitchenMarland Kitchen rec same meds, better diet, may need decr Glimep. ~  labs 8/10 showed BS= 127, A1c= 7.0.[name]. rec> keep same, better diet. ~  1/11: she reports BS at home all in the 100-140+ range... ~  labs 8/11 on Metfor500Bid+Glim4 showed BS= 151, A1c= 7.2 ~  labs 2/12 showed BS= 158, A1c= 7.2.Marland KitchenMarland Kitchen continue same + better diet. ~  Labs 8/12 showed BS= 129, A1c= 7.0.Marland KitchenMarland Kitchen Improved> same meds + diet! ~  Labs 2/13 on Metform500Bid+Glimep4 showed BS= 196, A1c= 7.2... Next step add 3rd med. ~  Labs 8/13 on Metform500Bid+Glimep4 showed BS= 153, A1c= 7.1  HIATAL HERNIA (ICD-553.3) - last EGD was 1980 showing 2cmHH, mild esophagitis and gastritis...  IRRITABLE BOWEL SYNDROME (ICD-564.1) - she uses Miralax, Senakot as needed...  COLONIC POLYPS (ICD-211.3) - last colonoscopy 7/05 by DrBrodie showed 2-85mm polyps... f/u planned 17yrs.  OVERACTIVE BLADDER (ICD-596.51) - she notes symptoms c/w overactive bladder 8/11 & we discussed trial of Toviaz... symptoms much improved after surg 12/11 [facility] & ureteral sling) and Toviaz stopped...  DEGENERATIVE JOINT DISEASE (ICD-715.90) - right hip DJD per DrGioffre w/ shot in 2006... repeat eval Spring2011 and nothing found according to the pt "I'm his mystery pt" she says... takes MOBIC 7.5mg  Prn w/ some relief...  BACK PAIN, LUMBAR  (ICD-724.2) - she prev had some leg pain at night, ?neuropathy and tried Lyrica... ~  1/11:  denies back pain etc...  OSTEOPENIA (ICD-733.90) - on OSCAL Bid + vits daily, she stopped Fosamax on her own & states intol to bisphosphonates...  ~  BMD 5/08 showed TScores -1.1 to -1.5.[name]. & VitD level=64 in 8/08... rec to start Fosamax- pt refused. ~  BMD 9/11 showed TScores -2.0 in Spine, and -1.9 in left FemNeck... pt refuses bisphos rx.  HEADACHE (ICD-784.0)  ANXIETY (ICD-300.00) - she uses CHLORAZEPATE 7.5mg  Prn; under stress w/ grand daughter; med refilled...  SCREENING STUDIES & HEALTH MAINT:  ~  NuclearStressTest 12/00 was neg for infarct or ischemia, EF=77%... ~  Pneumovax shot 2003 at age 34 ~  Tetanus shot 2004 w/ local inflamm reaction. ~  GYN= DrDickstein et al...   Past Surgical History  Procedure Date  . Eye surgery     age 71  . Vaginal hysterectomy     and ureteral sling 06/2010 by Dr. Elvera Bicker    Outpatient Encounter Prescriptions as of 02/19/2012  Medication Sig Dispense Refill  . ACCU-CHEK FASTCLIX LANCETS MISC 1 Device by Does not apply route as directed. Dx: 230.00  102 each  11  . aspirin 81 MG tablet Take 81 mg by mouth daily.        . Blood Glucose Monitoring Suppl (ACCU-CHEK AVIVA PLUS) W/DEVICE KIT 1 Device by Does not apply route as directed.  1 kit  0  . calcium-vitamin D (OSCAL WITH D) 500-200 MG-UNIT per tablet Take 1 tablet by mouth 2 times per week      . clorazepate (TRANXENE) 7.5 MG tablet take 1 tablet by mouth three times a day if needed for nerves  90 tablet  5  . fish oil-omega-3 fatty acids 1000 MG capsule Take 1 g by mouth daily.        [name] glimepiride (AMARYL) 4 MG tablet take 1 tablet once daily every morning  30 tablet  11  . glucose blood (ACCU-CHEK AVIVA) test strip Check blood sugars up to twice a day or as directed DX: 250.00  100 each  12  . meloxicam (MOBIC) 7.5 MG tablet Take 1 tablet (7.5 mg total) by mouth daily. As needed for  arthritis pain  30 tablet  11  . metFORMIN (GLUCOPHAGE) 500 MG tablet take 1 tablet by mouth twice a day  60 tablet  11  . Multiple Vitamins-Minerals (CENTRUM SILVER PO) Take 1 tablet by mouth daily.        [name] DISCONTD: PRODIGY NO CODING BLOOD GLUC test strip TEST BLOOD SUGAR TWO TIMES A DAY  100 each  4    Allergies  Allergen Reactions  . Codeine     REACTION: nausea    Current Medications, Allergies, Past Medical History, Past Surgical History, Family History, and Social History were reviewed in [employer] record.   Review of Systems    See HPI - all other systems neg except as noted...  The patient denies anorexia, fever, weight loss, weight gain, vision loss, decreased hearing, hoarseness, chest pain, syncope, dyspnea on exertion, peripheral edema, prolonged cough, headaches, hemoptysis, abdominal pain, melena, hematochezia, severe indigestion/heartburn, hematuria, incontinence, muscle weakness, suspicious skin lesions, transient blindness, difficulty walking, depression, unusual weight change, abnormal bleeding, enlarged lymph nodes, and angioedema.   Objective:   Physical Exam     WD, WN, 78 y/o BF in NAD... GENERAL:  Alert & oriented; pleasant & cooperative... HEENT:  [place]/AT, Ophthal per DrHecker, EACs-clear, TMs-wnl, NOSE-clear, THROAT-clear & wnl. NECK:  Supple w/ fairROM; no JVD; normal carotid impulses w/o bruits; no thyromegaly or nodules palpated; no lymphadenopathy. CHEST:  Clear to P & A; without wheezes/ rales/ or rhonchi heard... HEART:  Regular Rhythm; without murmurs/ rubs/ or gallops detected... ABDOMEN:  Soft & nontender; normal bowel sounds; no organomegaly or masses palpated, no guarding, or rebound. ... EXT: without deformities, mod arthritic changes; no varicose veins/ +venous insuffic/ no edema. some decr ROM left hip... NEURO:  CN's intact; motor testing normal; sensory testing normal & monofilament test= norm; gait normal & balance  OK. DERM:  No  lesions noted; no rash etc...  RADIOLOGY DATA:  Reviewed in the EPIC EMR & discussed w/ the patient...  LABORATORY DATA:  Reviewed in the EPIC EMR & discussed w/ the patient...   Assessment & Plan:    HBP>  BP is well regulated on diet alone- she avoids sodium & has been working on wt reduction...  VI>  Stable, she knows to avoid sodium, elevate legs, wear support hose...  CHOL>  She refuses statin meds; fortunately her recent diet efforts have been rewarded w/ much better lipid numbers, keep up the good work...  DM>  On Metformin Bid & Glimep 4mg Qam, plus diet etc;  Labs are improved w/ BS=153, A1c 7.1> continue same for now.  GI> HH, IBS, Polyps>  Stable, no changes...  DJD, LBP>  Stable on NSAID analgesics...  Osteopenia>  She still refuses Bisphos rx; continue calcium, MVI, Vit D supplementation...  Anxiety>  Under stress w/ grand daughter... Refilled her Tranxene today.   Patient's Medications  New Prescriptions   No medications on file  Previous Medications   ACCU-CHEK FASTCLIX LANCETS MISC    1 Device by Does not apply route as directed. Dx: 230.00   ASPIRIN 81 MG TABLET    Take 81 mg by mouth daily.     BLOOD GLUCOSE MONITORING SUPPL (ACCU-CHEK AVIVA PLUS) W/DEVICE KIT    1 Device by Does not apply route as directed.   CALCIUM-VITAMIN D (OSCAL WITH D) 500-200 MG-UNIT PER TABLET    Take 1 tablet by mouth 2 times per week   CLORAZEPATE (TRANXENE) 7.5 MG TABLET    take 1 tablet by mouth three times a day if needed for nerves   FISH OIL-OMEGA-3 FATTY ACIDS 1000 MG CAPSULE    Take 1 g by mouth daily.     GLIMEPIRIDE (AMARYL) 4 MG TABLET    take 1 tablet once daily every morning   GLUCOSE BLOOD (ACCU-CHEK AVIVA) TEST STRIP    Check blood sugars up to twice a day or as directed DX: 250.00   MELOXICAM (MOBIC) 7.5 MG TABLET    Take 1 tablet (7.5 mg total) by mouth daily. As needed for arthritis pain   METFORMIN (GLUCOPHAGE) 500 MG TABLET    take 1 tablet by  mouth twice a day   MULTIPLE VITAMINS-MINERALS (CENTRUM SILVER PO)    Take 1 tablet by mouth daily.    Modified Medications   No medications on file  Discontinued Medications   PRODIGY NO CODING BLOOD GLUC TEST STRIP    TEST BLOOD SUGAR TWO TIMES A DAY

## 2012-02-19 NOTE — Patient Instructions (Addendum)
Today we updated your med list in our EPIC system...    Continue your current medications the same...  Today we did your follow up FASTING blood work...    We will call you w/ the results when avail...  Stay as active as possible...    Call for any problems...  Let's plan a similar f/u visit in 6 months.Marland KitchenMarland Kitchen

## 2012-02-20 ENCOUNTER — Telehealth: Payer: Self-pay | Admitting: Pulmonary Disease

## 2012-02-20 NOTE — Telephone Encounter (Signed)
Called and spoke with pt and she is aware of her lab results per SN.  Pt voiced her understanding of this and nothing further needed.

## 2012-03-04 ENCOUNTER — Other Ambulatory Visit: Payer: Self-pay | Admitting: Pulmonary Disease

## 2012-03-10 DIAGNOSIS — H02839 Dermatochalasis of unspecified eye, unspecified eyelid: Secondary | ICD-10-CM | POA: Diagnosis not present

## 2012-03-10 DIAGNOSIS — H40019 Open angle with borderline findings, low risk, unspecified eye: Secondary | ICD-10-CM | POA: Diagnosis not present

## 2012-03-26 ENCOUNTER — Other Ambulatory Visit: Payer: Self-pay | Admitting: Pulmonary Disease

## 2012-03-31 ENCOUNTER — Telehealth: Payer: Self-pay | Admitting: Pulmonary Disease

## 2012-03-31 ENCOUNTER — Other Ambulatory Visit: Payer: Self-pay | Admitting: Pulmonary Disease

## 2012-03-31 MED ORDER — METFORMIN HCL 500 MG PO TABS: 500.0000 mg | ORAL_TABLET | Freq: Two times a day (BID) | ORAL | Status: DC

## 2012-03-31 NOTE — Telephone Encounter (Signed)
Pt aware rx has been sent and nothing further was needed 

## 2012-04-22 ENCOUNTER — Ambulatory Visit (INDEPENDENT_AMBULATORY_CARE_PROVIDER_SITE_OTHER): Payer: Medicare Other

## 2012-04-22 DIAGNOSIS — Z23 Encounter for immunization: Secondary | ICD-10-CM

## 2012-04-23 ENCOUNTER — Ambulatory Visit: Payer: Medicare Other

## 2012-04-24 ENCOUNTER — Other Ambulatory Visit: Payer: Self-pay | Admitting: Pulmonary Disease

## 2012-05-05 ENCOUNTER — Other Ambulatory Visit: Payer: Self-pay | Admitting: Pulmonary Disease

## 2012-05-20 DIAGNOSIS — M79609 Pain in unspecified limb: Secondary | ICD-10-CM | POA: Diagnosis not present

## 2012-05-20 DIAGNOSIS — B351 Tinea unguium: Secondary | ICD-10-CM | POA: Diagnosis not present

## 2012-06-23 ENCOUNTER — Other Ambulatory Visit: Payer: Self-pay | Admitting: Pulmonary Disease

## 2012-06-30 ENCOUNTER — Telehealth: Payer: Self-pay | Admitting: Pulmonary Disease

## 2012-06-30 MED ORDER — BETAMETHASONE DIPROPIONATE 0.05 % EX OINT: TOPICAL_OINTMENT | Freq: Two times a day (BID) | CUTANEOUS | Status: DC

## 2012-06-30 NOTE — Telephone Encounter (Signed)
I spoke with pt and she is requesting an RX for betamethasone. She stated she was giving RX for this in 2011 for itching on her ears. It's an ointment not a cream. She does not want to try bendryl bc it makes her sleepy. She does not want to itch her skin and tear it up. Please advise SN thanks Last OV 02/19/12 Pending OV 08/21/12

## 2012-06-30 NOTE — Telephone Encounter (Signed)
Medication sent. ATC line busy. WCB.Carron Curie, CMA

## 2012-06-30 NOTE — Telephone Encounter (Signed)
Per SN----betamethasone ointment  #1 tube  Apply as directed.  thanks

## 2012-07-01 NOTE — Telephone Encounter (Signed)
LMOMTCB x 1 

## 2012-07-01 NOTE — Telephone Encounter (Signed)
Patient informed that medication was sent to pharmacy.  Nothing else needed.

## 2012-07-29 ENCOUNTER — Telehealth: Payer: Self-pay | Admitting: Pulmonary Disease

## 2012-07-29 NOTE — Telephone Encounter (Signed)
Spoke with patient in regards to medication coverage with insurance. Patient states that her coverage has changed and they are wanting her to use mail order/[facility]. Patient is refusing to use mail order for her medications. She wants to know if Dr Kriste Basque has any recommendations on what she needs to do about her medications or how to continue using [facility]. I encouraged patient to speak with insurance company to figure out total cost she will now have to pay on her medications since she is not wanting to use [facility]. Patient to communicate with her insurance company and [facility] to see how much her medications and supplies will cost without using [facility].  Medications/supplies include: Diabetic test strips, Amaryl 4mg   Nothing further needed. Patient wanted me to send this message to Dr Kriste Basque as Lorain Childes that she is having trouble with her insurance company and if need be own the road depending on cost of medications, she may have to consider changing her Diabetic meds to something a little more cost efficient. Until then, she is going to continue current regimen and will keep [place] posted.    Nothing further needed. Sent to SN as FYI.

## 2012-08-21 ENCOUNTER — Encounter: Payer: Self-pay | Admitting: Pulmonary Disease

## 2012-08-21 ENCOUNTER — Ambulatory Visit (INDEPENDENT_AMBULATORY_CARE_PROVIDER_SITE_OTHER): Payer: Medicare Other | Admitting: Pulmonary Disease

## 2012-08-21 ENCOUNTER — Other Ambulatory Visit (INDEPENDENT_AMBULATORY_CARE_PROVIDER_SITE_OTHER): Payer: Medicare Other

## 2012-08-21 VITALS — BP 98/62 | HR 71 | Temp 97.1°F | Ht 61.0 in | Wt 130.6 lb

## 2012-08-21 DIAGNOSIS — I872 Venous insufficiency (chronic) (peripheral): Secondary | ICD-10-CM | POA: Diagnosis not present

## 2012-08-21 DIAGNOSIS — K589 Irritable bowel syndrome without diarrhea: Secondary | ICD-10-CM

## 2012-08-21 DIAGNOSIS — N318 Other neuromuscular dysfunction of bladder: Secondary | ICD-10-CM

## 2012-08-21 DIAGNOSIS — M545 Low back pain: Secondary | ICD-10-CM

## 2012-08-21 DIAGNOSIS — D126 Benign neoplasm of colon, unspecified: Secondary | ICD-10-CM

## 2012-08-21 DIAGNOSIS — F411 Generalized anxiety disorder: Secondary | ICD-10-CM

## 2012-08-21 DIAGNOSIS — M949 Disorder of cartilage, unspecified: Secondary | ICD-10-CM

## 2012-08-21 DIAGNOSIS — E7800 Pure hypercholesterolemia, unspecified: Secondary | ICD-10-CM

## 2012-08-21 DIAGNOSIS — K449 Diaphragmatic hernia without obstruction or gangrene: Secondary | ICD-10-CM | POA: Diagnosis not present

## 2012-08-21 DIAGNOSIS — E119 Type 2 diabetes mellitus without complications: Secondary | ICD-10-CM

## 2012-08-21 DIAGNOSIS — R51 Headache: Secondary | ICD-10-CM

## 2012-08-21 DIAGNOSIS — M1990 Unspecified osteoarthritis, unspecified site: Secondary | ICD-10-CM

## 2012-08-21 LAB — BASIC METABOLIC PANEL
Calcium: 9.4 mg/dL (ref 8.4–10.5)
Creatinine, Ser: 0.7 mg/dL (ref 0.4–1.2)
GFR: 101.04 mL/min (ref >60.00)
Sodium: 138 mEq/L (ref 135–145)

## 2012-08-21 LAB — HEMOGLOBIN A1C: Hgb A1c MFr Bld: 7.1 % — ABNORMAL HIGH (ref 4.6–6.5)

## 2012-08-21 NOTE — Patient Instructions (Addendum)
Today we updated your med list in our EPIC system...    Continue your current medications the same...  Today we checked your follow up DM labs...    We will contact you w/ the results when avail...  Call for any problems...  Let's plan a similar check up in 6 months.Marland KitchenMarland Kitchen

## 2012-08-21 NOTE — Progress Notes (Signed)
Subjective:    Patient ID: Kimberly Aguirre, female    DOB: 11/23/1933, 79 y.o.   MRN: [id number]  HPI 79 y/o BF here for a follow up visit...  he has multiple medical problems as noted below...   ~  August 21, 2011:  242mo ROV & she had recent URI treated w/ ZPak & improved; CXR was clear, NAD; had Fluvax 10/12...    BorderlineBP> controlled on diet alone; BP= 120/70 & she denies CP, palpit, dizzy, SOB, edema, etc...    Chol> on fish oil, and refused statin Rx prev; FLP 8/12 actually much improved on diet & she is encouraged to keep up the good work...    DM> on Metform500Bid & Glimep4mg Qam; BS=196, A1c=7.2 and she may need 3rd med (intol to incr Metform)...    GI- HH, IBS, Polyps> stable on OTC meds prn...    GU- OB> symptoms much improved after her surg 12/11 (TVH, ureteral sling) & prev meds stopped...    DJD, LBP, Osteopenia> on Mobic as needed; takes calcium, MVI, VitD; refuses bisphos Rx...  ~  February 19, 2012:  242mo ROV & Kimberly Aguirre has some diffuse aches & pains- eg left arm & left groin area; she says "my left side is my weak side", walking helps, about the same on Mobic, & she notes that "being around other people helps"...  She is also c/o some stress w/ granddaughter & she notes BS is up when she's stressed...  Weight is down 5# to 127# today & labs showed BS=153, A1c=7.1 on Metform+Amaryl...     We reviewed prob list, meds, xrays and labs> see below>> LABS 8/13:  FLP- ok on diet & FishOil;  Chems- ok x BS=153 A1c=7.1;  CBC- ok;  TSH=1.00;  VitD=42; UA= ok  ~  August 21, 2012:  242mo ROV & Kimberly Aguirre notes some tension headaches that she thinks is realted to stress- she takes Tylenol or Goodies w/ relief, & she has Chlorazepate for nerves...     BP controlled on diet alone & measures 100/62 today; she denies CP, palpit, SOB, dizzy, edema; she [facility]...    Lipids have been reasonable on diet + Fish Oil therapy & she prefers to avoid statin Rx...    DM  control is acceptable on Metform500Bid & Glimep4/d; BS=84 (denies hypoglycemia) and A1c=7.1; she notes BS is up & down at home; Rec to continue same & monitor for any low sugatrs in case we have to lower the Glimep dose...    GI is stable w/o abd pain, n/v/d, but she notes occas constip- Rec Miralax, Senakot...    Bladder symptoms resolved, control improved...    DJD, LBP> she has heberdens nodes but not c/o much discomfort; back imoproved, prev tried Lyrica- now off... We reviewed prob list, meds, xrays and labs> see below for updates >> she had the 2013 Flu vaccine 10/13... 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 birth defect, she had eye surgery age 43, followed by DrHecker with mild bilat diabetic retinopathy reported 2/11 (she plans f/u in 242mo).  ESSENTIAL HYPERTENSION, BORDERLINE (ICD-401.9) - controlled on diet... ~  2/13:  BP= 114/72 & denies HA, fatigue, visual changes, CP, palipit, dizziness, syncope, dyspnea, edema, etc... ~  CXR 2/13 showed heart at upper lim normal, min bronchitic changes, clear, NAD ~  8/13:  BP= 112/68 & she denies CP, palpit, SOB, edema, etc;  K=3.8, BUN=14, Creat=0.8 ~  2/14: BP controlled on diet alone & measures 100/62 today; she denies CP, palpit, SOB, dizzy, edema; she has [facility].   CHEST PAIN, ATYPICAL (ICD-786.59) - hx atypical CP 2010> sharp, right chest, noticed at night & not days, 2-4/10 severity, no assoc n/v/diaphoresis, resolves spont in a few minutes... baseline EKG= NSR, WNL.[name]. had NuclearStressTest Dec00 which was neg- no infarct or ischemia, EF= 77%... no known coronary dis, but mult risk factors= DM, Chol, +FamHx strokes... [facility] & [facility]...   VENOUS INSUFFICIENCY (ICD-459.81) - follows low sodium diet, elevates legs, support hose... no swelling.  HYPERCHOLESTEROLEMIA (ICD-272.0) - on FISH OIL daily, she  stopped her prev Simvastatin 20mg  on her own... refuses statin therapy due to pain and swelling in her ankles, and she just wants Diet Rx alone... ~  FLP 8/08 showed TChol 147, TG 102, HDL 35, LDL 92... ? on her simvastatin 20mg /d. ~  FLP 8/09 showed TChol 163, TG 129, HDL 29, LDL 109... she prefers diet alone- discussed! ~  FLP 8/10 showed TChol 211, Tg 187, HDL 41, LDL 146...offered Prav40 w/ hope fewer side effects. ~  1/11: she reports trial Prav40 but stopped this due to "cramping in joints, esp ankles"... ~  FLP 8/11 on diet alone showed TChol 189, TG 155, HDL 40, LDL 118... "I'm committed to diet & ex" ~  FLP 2/12 showed TChol 220, TG 182, HDL 44, LDL 157... she confirms her refusal of statin rx. ~  FLP 8/12 showed TChol 179, TG 116, HDL 47, LDL 109... Much improved on diet, continue same! ~  FLP 8/13 showed TChol 177, TG 93, HDL 49, LDL 109  DIABETES MELLITUS (ICD-250.00) - on METFORMIN 500mg Bid & AMARYL 4mg /d...  ~  labs 12/08 showed BS=101, HgA1c=7.3.Marland KitchenMarland Kitchen rec to add Actos but pt didn't fill this Rx. ~  labs 8/09 showed BS= 146, HgA1c= 7.4.Marland KitchenMarland Kitchen rec incr Metform 2Bid + Amaryl 4mg /d...  ~  note- pt states intol to Metform 2Bid and had to decr back to 1Bid + the Glimep... ~  labs 2/10 showed BS= 53, A1c= 7.0.Marland KitchenMarland Kitchen rec same meds, better diet, may need decr Glimep. ~  labs 8/10 showed BS= 127, A1c= 7.0.[name]. rec> keep same, better diet. ~  1/11: she reports BS at home all in the 100-140+ range... ~  labs 8/11 on Metfor500Bid+Glim4 showed BS= 151, A1c= 7.2 ~  labs 2/12 showed BS= 158, A1c= 7.2.Marland KitchenMarland Kitchen continue same + better diet. ~  Labs 8/12 showed BS= 129, A1c= 7.0.Marland KitchenMarland Kitchen Improved> same meds + diet! ~  Labs 2/13 on Metform500Bid+Glimep4 showed BS= 196, A1c= 7.2... Next step add 3rd med. ~  Labs 8/13 on Metform500Bid+Glimep4 showed BS= 153, A1c= 7.1 ~  Labs 2/14 on Metform500Bid+Glim4 showed BS= 84, A1c= 7.1  HIATAL HERNIA (ICD-553.3) - last EGD was 1980 showing 2cmHH, mild esophagitis and  gastritis...  IRRITABLE BOWEL SYNDROME (ICD-564.1) - she uses Miralax, Senakot as needed...  COLONIC POLYPS (ICD-211.3) - last colonoscopy 7/05 by DrBrodie showed 2-52mm polyps... f/u planned 37yrs. ~  8/12: had pain in left groin area on & off w/ eval from GYN & given Ibuprofen which helped...  OVERACTIVE BLADDER (ICD-596.51) - she notes symptoms c/w overactive bladder 8/11 & we discussed trial of Toviaz... symptoms much improved after surg 12/11 by drDickstein & [facility] & ureteral sling) and Toviaz stopped...  DEGENERATIVE  JOINT DISEASE (ICD-715.90) - right hip DJD per DrGioffre w/ shot in 2006... repeat eval Spring2011 and nothing found according to the pt "I'm his mystery pt" she says... takes MOBIC 7.5mg  Prn w/ some relief...  BACK PAIN, LUMBAR (ICD-724.2) - she prev had some leg pain at night, ?neuropathy and tried Lyrica... ~  1/11:  denies back pain etc...  OSTEOPENIA (ICD-733.90) - on OSCAL Bid + vits daily, she stopped Fosamax on her own & states intol to bisphosphonates...  ~  BMD 5/08 showed TScores -1.1 to -1.5.[name]. & VitD level=64 in 8/08... rec to start Fosamax- pt refused. ~  BMD 9/11 showed TScores -2.0 in Spine, and -1.9 in left FemNeck... pt refuses bisphos rx. ~  she has osteopenia followed by GYN but pt refuses bisphos therapy- on calcium, vits.   HEADACHE (ICD-784.0)  ANXIETY (ICD-300.00) - she uses CHLORAZEPATE 7.5mg  Prn; under stress w/ grand daughter; med refilled...  SCREENING STUDIES & HEALTH MAINT:  ~  NuclearStressTest 12/00 was neg for infarct or ischemia, EF=77%... ~  Pneumovax shot 2003 at age 50 ~  Tetanus shot 2004 w/ local inflamm reaction. ~  GYN= DrDickstein et al...   Past Surgical History  Procedure Date  . Eye surgery     age 23  . Vaginal hysterectomy     and ureteral sling 06/2010 by Dr. Elvera Bicker    Outpatient Encounter Prescriptions as of 08/21/2012  Medication Sig Dispense Refill  . ACCU-CHEK AVIVA PLUS test strip TEST AS DIRECTED  UP TO 2 TIMES A DAY  100 strip  6  . ACCU-CHEK FASTCLIX LANCETS MISC 1 Device by Does not apply route as directed. Dx: 230.00  102 each  11  . aspirin 81 MG tablet Take 81 mg by mouth daily.        . betamethasone dipropionate (DIPROLENE) 0.05 % ointment Apply topically 2 (two) times daily.  30 g  0  . Blood Glucose Monitoring Suppl (ACCU-CHEK AVIVA PLUS) W/DEVICE KIT 1 Device by Does not apply route as directed.  1 kit  0  . calcium-vitamin D (OSCAL WITH D) 500-200 MG-UNIT per tablet Take 1 tablet by mouth 2 times per week      . clorazepate (TRANXENE) 7.5 MG tablet take 1 tablet by mouth three times a day if needed for nerves  90 tablet  1  . fish oil-omega-3 fatty acids 1000 MG capsule Take 1 g by mouth daily.        [name] glimepiride (AMARYL) 4 MG tablet take 1 tablet once daily every morning  30 tablet  6  . meloxicam (MOBIC) 7.5 MG tablet take 1 tablet by mouth once daily if needed for ARTHRITIS PAIN  30 tablet  11  . metFORMIN (GLUCOPHAGE) 500 MG tablet Take 1 tablet (500 mg total) by mouth 2 (two) times daily.  60 tablet  11  . Multiple Vitamins-Minerals (CENTRUM SILVER PO) Take 1 tablet by mouth daily.          Allergies  Allergen Reactions  . Codeine     REACTION: nausea    Current Medications, Allergies, Past Medical History, Past Surgical History, Family History, and Social History were reviewed in [employer] record.   Review of Systems    See HPI - all other systems neg except as noted...  The patient denies anorexia, fever, weight loss, weight gain, vision loss, decreased hearing, hoarseness, chest pain, syncope, dyspnea on exertion, peripheral edema, prolonged cough, headaches, hemoptysis, abdominal pain, melena, hematochezia, severe indigestion/heartburn,  hematuria, incontinence, muscle weakness, suspicious skin lesions, transient blindness, difficulty walking, depression, unusual weight change, abnormal bleeding, enlarged lymph nodes, and  angioedema.   Objective:   Physical Exam     WD, WN, 78 y/o BF in NAD... GENERAL:  Alert & oriented; pleasant & cooperative... HEENT:  [place]/AT, Ophthal per DrHecker, EACs-clear, TMs-wnl, NOSE-clear, THROAT-clear & wnl. NECK:  Supple w/ fairROM; no JVD; normal carotid impulses w/o bruits; no thyromegaly or nodules palpated; no lymphadenopathy. CHEST:  Clear to P & A; without wheezes/ rales/ or rhonchi heard... HEART:  Regular Rhythm; without murmurs/ rubs/ or gallops detected... ABDOMEN:  Soft & nontender; normal bowel sounds; no organomegaly or masses palpated, no guarding, or rebound. ... EXT: without deformities, mod arthritic changes; no varicose veins/ +venous insuffic/ no edema. some decr ROM left hip... NEURO:  CN's intact; motor testing normal; sensory testing normal & monofilament test= norm; gait normal & balance OK. DERM:  No lesions noted; no rash etc...  RADIOLOGY DATA:  Reviewed in the EPIC EMR & discussed w/ the patient...  LABORATORY DATA:  Reviewed in the EPIC EMR & discussed w/ the patient...   Assessment & Plan:    HBP>  BP is well regulated on diet alone- she avoids sodium & has been working on wt reduction...  VI>  Stable, she knows to avoid sodium, elevate legs, wear support hose...  CHOL>  She refuses statin meds; fortunately her recent diet efforts have been rewarded w/ much better lipid numbers, keep up the good work...  DM>  On Metformin Bid & Glimep 4mg Qam, plus diet etc;  Labs are improved w/ BS=84, A1c 7.1> continue same for now.  GI> HH, IBS, Polyps>  Stable, no changes...  DJD, LBP>  Stable on NSAID analgesics...  Osteopenia>  She still refuses Bisphos rx; continue calcium, MVI, Vit D supplementation...  Anxiety>  Under stress w/ grand daughter... We refilled her Tranxene- she notes some tension/ mixed HAs & uses tylenol/ Goodies (her choice).   Patient's Medications  New Prescriptions   No medications on file  Previous Medications    ACCU-CHEK AVIVA PLUS TEST STRIP    TEST AS DIRECTED UP TO 2 TIMES A DAY   ACCU-CHEK FASTCLIX LANCETS MISC    1 Device by Does not apply route as directed. Dx: 230.00   ASPIRIN 81 MG TABLET    Take 81 mg by mouth daily.     BETAMETHASONE DIPROPIONATE (DIPROLENE) 0.05 % OINTMENT    Apply topically 2 (two) times daily.   BLOOD GLUCOSE MONITORING SUPPL (ACCU-CHEK AVIVA PLUS) W/DEVICE KIT    1 Device by Does not apply route as directed.   CALCIUM-VITAMIN D (OSCAL WITH D) 500-200 MG-UNIT PER TABLET    Take 1 tablet by mouth 2 times per week   CLORAZEPATE (TRANXENE) 7.5 MG TABLET    take 1 tablet by mouth three times a day if needed for nerves   FISH OIL-OMEGA-3 FATTY ACIDS 1000 MG CAPSULE    Take 1 g by mouth daily.     GLIMEPIRIDE (AMARYL) 4 MG TABLET    take 1 tablet once daily every morning   MELOXICAM (MOBIC) 7.5 MG TABLET    take 1 tablet by mouth once daily if needed for ARTHRITIS PAIN   METFORMIN (GLUCOPHAGE) 500 MG TABLET    Take 1 tablet (500 mg total) by mouth 2 (two) times daily.   MULTIPLE VITAMINS-MINERALS (CENTRUM SILVER PO)    Take 1 tablet by mouth daily.    Modified Medications  No medications on file  Discontinued Medications   No medications on file

## 2012-09-01 DIAGNOSIS — M79609 Pain in unspecified limb: Secondary | ICD-10-CM | POA: Diagnosis not present

## 2012-09-01 DIAGNOSIS — B351 Tinea unguium: Secondary | ICD-10-CM | POA: Diagnosis not present

## 2012-10-05 ENCOUNTER — Other Ambulatory Visit: Payer: Self-pay | Admitting: Pulmonary Disease

## 2012-10-06 ENCOUNTER — Telehealth: Payer: Self-pay | Admitting: Pulmonary Disease

## 2012-10-06 MED ORDER — ACCU-CHEK FASTCLIX LANCETS MISC: 1.0000 [IU] | Freq: Every day | Status: DC

## 2012-10-06 NOTE — Telephone Encounter (Signed)
Spoke with patient, patient needed lancet sent in to [facility]. According to our records rx sent in 'normal' 10/05/12 #102 x 11 refills. I spoke w pharmacy rep and he states they do not have a rx for this. I have resent rx for this and nothing further needed at this time.

## 2012-10-07 ENCOUNTER — Other Ambulatory Visit: Payer: Self-pay | Admitting: Pulmonary Disease

## 2012-10-07 ENCOUNTER — Telehealth: Payer: Self-pay | Admitting: Pulmonary Disease

## 2012-10-07 MED ORDER — ACCU-CHEK FASTCLIX LANCETS MISC: 1.0000 [IU] | Freq: Every day | Status: DC

## 2012-10-07 NOTE — Telephone Encounter (Signed)
Pt called back about same. Kimberly Aguirre  °

## 2012-10-07 NOTE — Telephone Encounter (Signed)
Per pt's chart, the rx for lancets were resent yesterday 3.24.14 [facility], spoke with pharmacist Kathlene November Rx was received, but dx code - need this for medicare Gave verbal dx code 250.00 for DM as per pt's chart Nothing further needed per Glennon Hamilton spoke with patient, apologized for the inconvenience but informed her that everything should be taken care of now Nothing further needed; will sign off.

## 2012-10-30 ENCOUNTER — Telehealth: Payer: Self-pay | Admitting: Pulmonary Disease

## 2012-10-30 ENCOUNTER — Other Ambulatory Visit: Payer: Self-pay | Admitting: Pulmonary Disease

## 2012-10-30 NOTE — Telephone Encounter (Signed)
I was able to talk pt into getting an appt w/ TP.  Kimberly Aguirre

## 2012-11-03 ENCOUNTER — Telehealth: Payer: Self-pay | Admitting: Pulmonary Disease

## 2012-11-03 ENCOUNTER — Ambulatory Visit (INDEPENDENT_AMBULATORY_CARE_PROVIDER_SITE_OTHER): Payer: Medicare Other | Admitting: Adult Health

## 2012-11-03 ENCOUNTER — Encounter: Payer: Self-pay | Admitting: Adult Health

## 2012-11-03 DIAGNOSIS — R0789 Other chest pain: Secondary | ICD-10-CM

## 2012-11-03 DIAGNOSIS — M1990 Unspecified osteoarthritis, unspecified site: Secondary | ICD-10-CM | POA: Diagnosis not present

## 2012-11-03 MED ORDER — CLORAZEPATE DIPOTASSIUM 7.5 MG PO TABS: 7.5000 mg | ORAL_TABLET | Freq: Three times a day (TID) | ORAL | Status: DC

## 2012-11-03 NOTE — Progress Notes (Signed)
Subjective:    Patient ID: Kimberly Aguirre, female    DOB: 22-Dec-1933, 79 y.o.   MRN: [id number]  HPI 79 y/o BF here for a follow up visit...  he has multiple medical problems as noted below...   ~  August 21, 2011:  74mo ROV & she had recent URI treated w/ ZPak & improved; CXR was clear, NAD; had Fluvax 10/12...    BorderlineBP> controlled on diet alone; BP= 120/70 & she denies CP, palpit, dizzy, SOB, edema, etc...    Chol> on fish oil, and refused statin Rx prev; FLP 8/12 actually much improved on diet & she is encouraged to keep up the good work...    DM> on Metform500Bid & Glimep4mg Qam; BS=196, A1c=7.2 and she may need 3rd med (intol to incr Metform)...    GI- HH, IBS, Polyps> stable on OTC meds prn...    GU- OB> symptoms much improved after her surg 12/11 (TVH, ureteral sling) & prev meds stopped...    DJD, LBP, Osteopenia> on Mobic as needed; takes calcium, MVI, VitD; refuses bisphos Rx...  ~  February 19, 2012:  74mo ROV & Kimberly Aguirre has some diffuse aches & pains- eg left arm & left groin area; she says "my left side is my weak side", walking helps, about the same on Mobic, & she notes that "being around other people helps"...  She is also c/o some stress w/ granddaughter & she notes BS is up when she's stressed...  Weight is down 5# to 127# today & labs showed BS=153, A1c=7.1 on Metform+Amaryl...     We reviewed prob list, meds, xrays and labs> see below>> LABS 8/13:  FLP- ok on diet & FishOil;  Chems- ok x BS=153 A1c=7.1;  CBC- ok;  TSH=1.00;  VitD=42; UA= ok  ~  August 21, 2012:  74mo ROV & Kimberly Aguirre notes some tension headaches that she thinks is realted to stress- she takes Tylenol or Goodies w/ relief, & she has Chlorazepate for nerves...     BP controlled on diet alone & measures 100/62 today; she denies CP, palpit, SOB, dizzy, edema; she [facility]...    Lipids have been reasonable on diet + Fish Oil therapy & she prefers to avoid statin Rx...    DM  control is acceptable on Metform500Bid & Glimep4/d; BS=84 (denies hypoglycemia) and A1c=7.1; she notes BS is up & down at home; Rec to continue same & monitor for any low sugatrs in case we have to lower the Glimep dose...    GI is stable w/o abd pain, n/v/d, but she notes occas constip- Rec Miralax, Senakot...    Bladder symptoms resolved, control improved...    DJD, LBP> she has heberdens nodes but not c/o much discomfort; back imoproved, prev tried Lyrica- now off... We reviewed prob list, meds, xrays and labs> see below for updates >> she had the 2013 Flu vaccine 10/13... LABS 2/14:  Chems- wnl w/ BS=84 A1c=7.1  11/03/2012 Acute OV  Complains of occasional left shoulder pain x2-3weeks Worse with anxitey or she is stressed.  Under a lot of stress with grand-daughter.  Exercises 3 times weekly -no chest  Pain or shoulder pain.  No chest pain or palpitations . No syncope .  No pain w/ ROM or tenderness noted.  She is very worried this is her heart and wants it checked out. She has no FH of CAD.  She has a hx of DM and hyperlipidemia.  Pain comes on  along upper left shoulder/arm and goes down into arm.  No neck pain or pain w/ ROM.  No GERD or heartburn.  No meds used for tx.               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 birth defect, she had eye surgery age 53, followed by DrHecker with mild bilat diabetic retinopathy reported 2/11 (she plans f/u in 56mo).  ESSENTIAL HYPERTENSION, BORDERLINE (ICD-401.9) - controlled on diet... ~  2/13:  BP= 114/72 & denies HA, fatigue, visual changes, CP, palipit, dizziness, syncope, dyspnea, edema, etc... ~  CXR 2/13 showed heart at upper lim normal, min bronchitic changes, clear, NAD ~  8/13:  BP= 112/68 & she denies CP, palpit, SOB, edema, etc;  K=3.8, BUN=14, Creat=0.8 ~  2/14: BP controlled on diet alone & measures 100/62 today; she denies CP, palpit, SOB, dizzy, edema; she has [facility]  3d/wk.   CHEST PAIN, ATYPICAL (ICD-786.59) - hx atypical CP 2010> sharp, right chest, noticed at night & not days, 2-4/10 severity, no assoc n/v/diaphoresis, resolves spont in a few minutes... baseline EKG= NSR, WNL.[name]. had NuclearStressTest Dec00 which was neg- no infarct or ischemia, EF= 77%... no known coronary dis, but mult risk factors= DM, Chol, +FamHx strokes... [facility]...   VENOUS INSUFFICIENCY (ICD-459.81) - follows low sodium diet, elevates legs, support hose... no swelling.  HYPERCHOLESTEROLEMIA (ICD-272.0) - on FISH OIL daily, she stopped her prev Simvastatin 20mg  on her own... refuses statin therapy due to pain and swelling in her ankles, and she just wants Diet Rx alone... ~  FLP 8/08 showed TChol 147, TG 102, HDL 35, LDL 92... ? on her simvastatin 20mg /d. ~  FLP 8/09 showed TChol 163, TG 129, HDL 29, LDL 109... she prefers diet alone- discussed! ~  FLP 8/10 showed TChol 211, Tg 187, HDL 41, LDL 146...offered Prav40 w/ hope fewer side effects. ~  1/11: she reports trial Prav40 but stopped this due to "cramping in joints, esp ankles"... ~  FLP 8/11 on diet alone showed TChol 189, TG 155, HDL 40, LDL 118... "I'm committed to diet & ex" ~  FLP 2/12 showed TChol 220, TG 182, HDL 44, LDL 157... she confirms her refusal of statin rx. ~  FLP 8/12 showed TChol 179, TG 116, HDL 47, LDL 109... Much improved on diet, continue same! ~  FLP 8/13 showed TChol 177, TG 93, HDL 49, LDL 109  DIABETES MELLITUS (ICD-250.00) - on METFORMIN 500mg Bid & AMARYL 4mg /d...  ~  labs 12/08 showed BS=101, HgA1c=7.3.Marland KitchenMarland Kitchen rec to add Actos but pt didn't fill this Rx. ~  labs 8/09 showed BS= 146, HgA1c= 7.4.Marland KitchenMarland Kitchen rec incr Metform 2Bid + Amaryl 4mg /d...  ~  note- pt states intol to Metform 2Bid and had to decr back to 1Bid + the Glimep... ~  labs 2/10 showed BS= 53, A1c= 7.0.Marland KitchenMarland Kitchen rec same meds, better diet, may need decr Glimep. ~  labs 8/10 showed BS= 127, A1c= 7.0.[name]. rec> keep same, better  diet. ~  1/11: she reports BS at home all in the 100-140+ range... ~  labs 8/11 on Metfor500Bid+Glim4 showed BS= 151, A1c= 7.2 ~  labs 2/12 showed BS= 158, A1c= 7.2.Marland KitchenMarland Kitchen continue same + better diet. ~  Labs 8/12 showed BS= 129, A1c= 7.0.Marland KitchenMarland Kitchen Improved> same meds + diet! ~  Labs 2/13 on Metform500Bid+Glimep4 showed BS= 196, A1c= 7.2... Next step add 3rd med. ~  Labs  8/13 on Metform500Bid+Glimep4 showed BS= 153, A1c= 7.1 ~  Labs 2/14 on Metform500Bid+Glim4 showed BS= 84, A1c= 7.1  HIATAL HERNIA (ICD-553.3) - last EGD was 1980 showing 2cmHH, mild esophagitis and gastritis...  IRRITABLE BOWEL SYNDROME (ICD-564.1) - she uses Miralax, Senakot as needed...  COLONIC POLYPS (ICD-211.3) - last colonoscopy 7/05 by DrBrodie showed 2-50mm polyps... f/u planned 34yrs. ~  8/12: had pain in left groin area on & off w/ eval from GYN & given Ibuprofen which helped...  OVERACTIVE BLADDER (ICD-596.51) - she notes symptoms c/w overactive bladder 8/11 & we discussed trial of Toviaz... symptoms much improved after surg 12/11 by drDickstein & [facility] & ureteral sling) and Toviaz stopped...  DEGENERATIVE JOINT DISEASE (ICD-715.90) - right hip DJD per DrGioffre w/ shot in 2006... repeat eval Spring2011 and nothing found according to the pt "I'm his mystery pt" she says... takes MOBIC 7.5mg  Prn w/ some relief...  BACK PAIN, LUMBAR (ICD-724.2) - she prev had some leg pain at night, ?neuropathy and tried Lyrica... ~  1/11:  denies back pain etc...  OSTEOPENIA (ICD-733.90) - on OSCAL Bid + vits daily, she stopped Fosamax on her own & states intol to bisphosphonates...  ~  BMD 5/08 showed TScores -1.1 to -1.5.[name]. & VitD level=64 in 8/08... rec to start Fosamax- pt refused. ~  BMD 9/11 showed TScores -2.0 in Spine, and -1.9 in left FemNeck... pt refuses bisphos rx. ~  she has osteopenia followed by GYN but pt refuses bisphos therapy- on calcium, vits.   HEADACHE (ICD-784.0)  ANXIETY (ICD-300.00) - she uses CHLORAZEPATE  7.5mg  Prn; under stress w/ grand daughter; med refilled...  SCREENING STUDIES & HEALTH MAINT:  ~  NuclearStressTest 12/00 was neg for infarct or ischemia, EF=77%... ~  Pneumovax shot 2003 at age 45 ~  Tetanus shot 2004 w/ local inflamm reaction. ~  GYN= DrDickstein et al...   Past Surgical History  Procedure Laterality Date  . Eye surgery      age 20  . Vaginal hysterectomy      and ureteral sling 06/2010 by Dr. Elvera Bicker    Outpatient Encounter Prescriptions as of 11/03/2012  Medication Sig Dispense Refill  . ACCU-CHEK AVIVA PLUS test strip TEST AS DIRECTED UP TO 2 TIMES A DAY  100 strip  6  . ACCU-CHEK FASTCLIX LANCETS MISC 1 Units by Other route daily. Test blood sugar once daily.    Dx  250.00  102 each  6  . aspirin 81 MG tablet Take 81 mg by mouth daily.        . betamethasone dipropionate (DIPROLENE) 0.05 % ointment Apply topically 2 (two) times daily.  30 g  0  . Blood Glucose Monitoring Suppl (ACCU-CHEK AVIVA PLUS) W/DEVICE KIT 1 Device by Does not apply route as directed.  1 kit  0  . calcium-vitamin D (OSCAL WITH D) 500-200 MG-UNIT per tablet Take 1 tablet by mouth 2 times per week      . clorazepate (TRANXENE) 7.5 MG tablet Take 1 tablet (7.5 mg total) by mouth 3 (three) times daily.  90 tablet  5  . fish oil-omega-3 fatty acids 1000 MG capsule Take 1 g by mouth daily.        [name] glimepiride (AMARYL) 4 MG tablet take 1 tablet once daily every morning  30 tablet  6  . meloxicam (MOBIC) 7.5 MG tablet take 1 tablet by mouth once daily if needed for ARTHRITIS PAIN  30 tablet  11  . metFORMIN (GLUCOPHAGE) 500 MG tablet  Take 1 tablet (500 mg total) by mouth 2 (two) times daily.  60 tablet  11  . Multiple Vitamins-Minerals (CENTRUM SILVER PO) Take 1 tablet by mouth daily.         No facility-administered encounter medications on file as of 11/03/2012.    Allergies  Allergen Reactions  . Codeine     REACTION: nausea    Current Medications, Allergies, Past Medical  History, Past Surgical History, Family History, and Social History were reviewed in [employer] record.   Review of Systems     Constitutional:   No  weight loss, night sweats,  Fevers, chills, fatigue, or  lassitude.  HEENT:   No headaches,  Difficulty swallowing,  Tooth/dental problems, or  Sore throat,                No sneezing, itching, ear ache, nasal congestion, post nasal drip,   CV:  No chest pain,  Orthopnea, PND, swelling in lower extremities, anasarca, dizziness, palpitations, syncope.   GI  No heartburn, indigestion, abdominal pain, nausea, vomiting, diarrhea, change in bowel habits, loss of appetite, bloody stools.   Resp: No shortness of breath with exertion or at rest.  No excess mucus, no productive cough,  No non-productive cough,  No coughing up of blood.  No change in color of mucus.  No wheezing.  No chest wall deformity  Skin: no rash or lesions.  GU: no dysuria, change in color of urine, no urgency or frequency.  No flank pain, no hematuria   MS:  No joint  swelling.  No decreased range of motion.  No back pain.  Psych:  No change in mood or affect. No depression or anxiety.  No memory loss.       Objective:   Physical Exam     WD, WN, 78 y/o BF in NAD... GENERAL:  Alert & oriented; pleasant & cooperative... HEENT:  [place]/AT, Ophthal per DrHecker, EACs-clear, TMs-wnl, NOSE-clear, THROAT-clear & wnl. NECK:  Supple w/ fairROM; no JVD; normal carotid impulses w/o bruits; no thyromegaly or nodules palpated; no lymphadenopathy. CHEST:  Clear to P & A; without wheezes/ rales/ or rhonchi heard... HEART:  Regular Rhythm; without murmurs/ rubs/ or gallops detected... ABDOMEN:  Soft & nontender; normal bowel sounds; no organomegaly or masses palpated, no guarding, or rebound. EXT: without deformities, mod arthritic changes; no varicose veins/ +venous insuffic/ no edema. Bilateral equal arm strength, nml grips, ROM non reproducible pain.  Shoulders w/out deformity noted, nontender. Neck ROM mild limitation, non reproducible pain.  Chest wall nontender w/out deformity noted.  NEURO:   norm; gait normal & balance OK. DERM:  No lesions noted; no rash etc...  11/04/12 EKG : NSR , no acute changes noted.    Assessment & Plan:

## 2012-11-03 NOTE — Patient Instructions (Addendum)
Take Meloxicam 7.5mg  daily with food. Take most days for joint pain Alternate ice and heat to shoulder and hips As needed   If not improving call back will need referral to ortho  We are referring you to cardiology .  Please contact office for sooner follow up if symptoms do not improve or worsen or seek emergency care

## 2012-11-03 NOTE — Telephone Encounter (Signed)
Spoke to pt to let her know that we will be sending this message to Dr. Kriste Basque to get approval to refill. She verbalized understanding.  Last OV 08/21/2012 Last fill 07/03/2012 with 1 additional refill  Please advise if this would be okay to refill. Thanks.

## 2012-11-03 NOTE — Telephone Encounter (Signed)
Per SN---ok to refill this medication.  This has been called to the pharmacy and pt is aware.

## 2012-11-04 NOTE — Assessment & Plan Note (Signed)
?   Left shoulder pain is DJD related  Will treat with NSAIDS/heat/ice , if not improving will need further eval with xray +/- referral to ortho

## 2012-11-04 NOTE — Assessment & Plan Note (Signed)
Pt has several risk factors for CAD , unclear what is causing her intermittent left arm/shoulder pain. Does not appear to be cardiac in nature with nml EKG however pt wants referral to cardiology . She does have risk factors with DM/CHOL  along with age/female>make referral  To cards

## 2012-11-06 ENCOUNTER — Telehealth: Payer: Self-pay | Admitting: Pulmonary Disease

## 2012-11-06 NOTE — Telephone Encounter (Signed)
Spoke with the pt and she states that she received a refill on her clorazepate yesterday and they pills are a different color then she is used to . She states she called her pharmacy and they advised that they changed manufacturers so there is a difference in the pill color by assured her it was the correct pill. Pt wanted to call her as well to make sure the right drug was called in. I reviewed rx and refill was sent for clorazepate 7.5mg . Per pt this is what the bottle states as well. So I again assured the pt that it is the correct medication. Pt states understanding. Carron Curie, CMA

## 2012-11-07 ENCOUNTER — Telehealth: Payer: Self-pay | Admitting: Pulmonary Disease

## 2012-11-07 NOTE — Telephone Encounter (Signed)
Called and spoke with pt and she is aware that she will see cardiology---Dr. Sanjuana Kava for follow up chest wall pain and to eval due to risk factors.  Pt voiced her understanding and of this and was ok to keep this appt. Nothing further is needed.

## 2012-11-13 ENCOUNTER — Ambulatory Visit (INDEPENDENT_AMBULATORY_CARE_PROVIDER_SITE_OTHER): Payer: Medicare Other | Admitting: Cardiovascular Disease

## 2012-11-13 ENCOUNTER — Encounter: Payer: Self-pay | Admitting: Cardiovascular Disease

## 2012-11-13 VITALS — BP 148/90 | HR 80 | Ht 61.0 in | Wt 124.8 lb

## 2012-11-13 DIAGNOSIS — G4700 Insomnia, unspecified: Secondary | ICD-10-CM | POA: Diagnosis not present

## 2012-11-13 DIAGNOSIS — R079 Chest pain, unspecified: Secondary | ICD-10-CM | POA: Diagnosis not present

## 2012-11-13 NOTE — Patient Instructions (Addendum)
Your physician recommends that you schedule a follow-up appointment in:  About 1 month  Your physician has requested that you have an exercise stress myoview. For further information please visit [email or web address] Please follow instruction sheet, as given.  Your physician has requested that you have an echocardiogram. Echocardiography is a painless test that uses sound waves to create images of your heart. It provides your doctor with information about the size and shape of your heart and how well your heart's chambers and valves are working. This procedure takes approximately one hour. There are no restrictions for this procedure.

## 2012-11-13 NOTE — Progress Notes (Signed)
History of Present Illness: 79 yo female with history of DM, borderline HTN, IBS, DJD who is referred today for evaluation of left shoulder/upper left chest pain. She tells me that she has been having pains in left chest. This happens when she is upset or under stress. No associated SOB, dizziness, near syncope or syncope. This is not exertional. She is quite active, exercising 3-4 days per week. No prior cardiac history.   Primary Care Physician: Alroy Dust  Last Lipid Profile:Lipid Panel     Component Value Date/Time   CHOL 177 02/19/2012 0824   TRIG 93.0 02/19/2012 0824   HDL 49.10 02/19/2012 0824   CHOLHDL 4 02/19/2012 0824   VLDL 18.6 02/19/2012 0824   LDLCALC 109* 02/19/2012 0824     Past Medical History  Diagnosis Date  . Blindness of left eye   . Chest pain, atypical   . Venous insufficiency   . Diabetes mellitus   . Hiatal hernia   . IBS (irritable bowel syndrome)   . Hx of colonic polyps   . Overactive bladder   . DJD (degenerative joint disease)   . Osteopenia   . Headache   . Anxiety   . Dermatitis     Past Surgical History  Procedure Laterality Date  . Eye surgery      age 89  . Vaginal hysterectomy      and ureteral sling 06/2010 by Dr. Elvera Bicker    Current Outpatient Prescriptions  Medication Sig Dispense Refill  . ACCU-CHEK AVIVA PLUS test strip TEST AS DIRECTED UP TO 2 TIMES A DAY  100 strip  6  . ACCU-CHEK FASTCLIX LANCETS MISC 1 Units by Other route daily. Test blood sugar once daily.    Dx  250.00  102 each  6  . aspirin 81 MG tablet Take 81 mg by mouth daily.        . betamethasone dipropionate (DIPROLENE) 0.05 % ointment Apply topically as needed.      . Blood Glucose Monitoring Suppl (ACCU-CHEK AVIVA PLUS) W/DEVICE KIT 1 Device by Does not apply route as directed.  1 kit  0  . calcium-vitamin D (OSCAL WITH D) 500-200 MG-UNIT per tablet Take 1 tablet by mouth 2 times per week      . clorazepate (TRANXENE) 7.5 MG tablet Take 1 tablet (7.5 mg  total) by mouth 3 (three) times daily.  90 tablet  5  . fish oil-omega-3 fatty acids 1000 MG capsule Take 1 g by mouth daily.        [name] glimepiride (AMARYL) 4 MG tablet take 1 tablet once daily every morning  30 tablet  6  . meloxicam (MOBIC) 7.5 MG tablet take 1 tablet by mouth once daily if needed for ARTHRITIS PAIN  30 tablet  11  . metFORMIN (GLUCOPHAGE) 500 MG tablet Take 1 tablet (500 mg total) by mouth 2 (two) times daily.  60 tablet  11  . Multiple Vitamins-Minerals (CENTRUM SILVER PO) Take 1 tablet by mouth daily.         No current facility-administered medications for this visit.    Allergies  Allergen Reactions  . Codeine     REACTION: nausea    History   Social History  . Marital Status: Widowed    Spouse Name: N/A    Number of Children: 2  . Years of Education: N/A   Occupational History  . Retired-daycare business    Social History Main Topics  . Smoking status: Never Smoker   .  Smokeless tobacco: Not on file  . Alcohol Use: No  . Drug Use: No  . Sexually Active: Not on file   Other Topics Concern  . Not on file   Social History Narrative   4 brothers and 5 sisters    Family History  Problem Relation Age of Onset  . Diabetes Mother   . Diabetes Father   . CAD Neg Hx     Review of Systems:  As stated in the HPI and otherwise negative.   BP 148/90  Pulse 80  Ht 5\' 1"  (1.549 m)  Wt 124 lb 12.8 oz (56.609 kg)  BMI 23.59 kg/m2  SpO2 99%  Physical Examination: General: Well developed, well nourished, NAD HEENT: OP clear, mucus membranes moist SKIN: warm, dry. No rashes. Neuro: No focal deficits Musculoskeletal: Muscle strength 5/5 all ext Psychiatric: Mood and affect normal Neck: No JVD, no carotid bruits, no thyromegaly, no lymphadenopathy. Lungs:Clear bilaterally, no wheezes, rhonci, crackles Cardiovascular: Regular rate and rhythm. No murmurs, gallops or rubs. Abdomen:Soft. Bowel sounds present. Non-tender.  Extremities: No lower  extremity edema. Pulses are 2 + in the bilateral DP/PT.  EKG: NSR, rate 81 bpm. Possible LAE.   Assessment and Plan:   1. Chest pain: Her risk factors for CAD are advanced age, DM and HTN. Her chest pain has typical and atypical features. Will arrange echo to exclude structural heart disease and exercise stress myoview to exclude ischemia.   2. Insomnia: We have discussed sleep aids. She does not want a prescription sleep aid. I have suggested Benadryl or Dramamine for prn use.

## 2012-11-17 ENCOUNTER — Telehealth: Payer: Self-pay | Admitting: Cardiovascular Disease

## 2012-11-17 NOTE — Telephone Encounter (Signed)
Spoke with patient who is concerned that her BP was high at last o/v.  Patient states the reading on her after visit summary says 148/90 but that Dr. Clifton James told her her BP was 148/78.  Patient wants to verify that she has never had high BP and she doesn't want this on her chart.  I  Informed her that a diagnosis would not be made from a one-time reading and that BP may have been elevated due to having just walked into the office and/or we could have typed the wrong numbers into the computer.  Patient verbalized understanding.  Patient also had questions about her ECHO and stress test which were answered to her satisfaction and patient verified appointment dates and times.  Patient also states that when she was here for last o/v last week that someone asked her if she had thyroid problems which she has never been diagnosed with.  I explained to patient that thyroid issues are not listed on problem list or snapshot.

## 2012-11-17 NOTE — Telephone Encounter (Signed)
New Prob     Pt has some questions she would like to ask the nurse regarding her BP, stress test, , and ECHO.

## 2012-11-18 ENCOUNTER — Telehealth: Payer: Self-pay | Admitting: Pulmonary Disease

## 2012-11-18 NOTE — Telephone Encounter (Signed)
Spoke to pt. She was seen today by [facility]. Some things were said to her today about her having HTN. Pt has never been told that she has HTN and she isn't on any medication for this. She was confused and didn't know why this would be in her chart. Several tests were ordered today while at her visit (Echo, Stress Test). Again she was confused as to why she needed to do these tests. I advised her that this would be a question she should talk to Cardiology about. States that she called them but they wouldn't tell her. I advised her that they were probably doing these tests a precaution to make sure that everything IS okay with her heart. They did an EKG and it was normal. She verbalized understanding. Nothing further was needed.

## 2012-11-25 ENCOUNTER — Ambulatory Visit (HOSPITAL_COMMUNITY): Payer: Medicare Other | Attending: Cardiology | Admitting: Radiology

## 2012-11-25 ENCOUNTER — Other Ambulatory Visit: Payer: Self-pay

## 2012-11-25 DIAGNOSIS — R079 Chest pain, unspecified: Secondary | ICD-10-CM

## 2012-11-25 DIAGNOSIS — E119 Type 2 diabetes mellitus without complications: Secondary | ICD-10-CM | POA: Diagnosis not present

## 2012-11-25 DIAGNOSIS — I059 Rheumatic mitral valve disease, unspecified: Secondary | ICD-10-CM | POA: Insufficient documentation

## 2012-11-25 DIAGNOSIS — R072 Precordial pain: Secondary | ICD-10-CM | POA: Diagnosis not present

## 2012-11-25 DIAGNOSIS — I10 Essential (primary) hypertension: Secondary | ICD-10-CM | POA: Insufficient documentation

## 2012-11-25 NOTE — Progress Notes (Signed)
Echocardiogram performed.  

## 2012-11-27 ENCOUNTER — Ambulatory Visit (HOSPITAL_COMMUNITY): Payer: Medicare Other | Attending: Cardiovascular Disease | Admitting: Radiology

## 2012-11-27 VITALS — BP 142/80 | Ht 61.0 in | Wt 127.0 lb

## 2012-11-27 DIAGNOSIS — R5381 Other malaise: Secondary | ICD-10-CM | POA: Insufficient documentation

## 2012-11-27 DIAGNOSIS — E785 Hyperlipidemia, unspecified: Secondary | ICD-10-CM | POA: Insufficient documentation

## 2012-11-27 DIAGNOSIS — R5383 Other fatigue: Secondary | ICD-10-CM | POA: Insufficient documentation

## 2012-11-27 DIAGNOSIS — I10 Essential (primary) hypertension: Secondary | ICD-10-CM | POA: Diagnosis not present

## 2012-11-27 DIAGNOSIS — E119 Type 2 diabetes mellitus without complications: Secondary | ICD-10-CM

## 2012-11-27 DIAGNOSIS — R079 Chest pain, unspecified: Secondary | ICD-10-CM | POA: Diagnosis not present

## 2012-11-27 MED ORDER — TECHNETIUM TC 99M SESTAMIBI GENERIC - CARDIOLITE
30.0000 | Freq: Once | INTRAVENOUS | Status: AC | PRN
  Administered 2012-11-27: 30 via INTRAVENOUS

## 2012-11-27 MED ORDER — TECHNETIUM TC 99M SESTAMIBI GENERIC - CARDIOLITE
10.0000 | Freq: Once | INTRAVENOUS | Status: AC | PRN
  Administered 2012-11-27: 10 via INTRAVENOUS

## 2012-11-27 NOTE — Progress Notes (Signed)
[facility] SITE 3 NUCLEAR MED [address] [place], [place] [address] [phone]    Cardiology Nuclear Med Study  Kimberly Aguirre is a 79 y.o. female     MRN : [id number]     DOB: April 08, 1934  Procedure Date: 11/27/2012  Nuclear Med Background Indication for Stress Test:  Evaluation for Ischemia History:  > 15 years ago GXT Cardiac Risk Factors: Hypertension, Lipids and NIDDM  Symptoms:  Chest Pain and Fatigue   Nuclear Pre-Procedure Caffeine/Decaff Intake:  None NPO After: 9:00pm   Lungs:  clear O2 Sat: 96% on room air. IV 0.9% NS with Angio Cath:  22g  IV Site: L Antecubital  IV Started by:  Bonnita Levan, RN  Chest Size (in):  36 Cup Size: B  Height: 5\' 1"  (1.549 m)  Weight:  127 lb (57.607 kg)  BMI:  Body mass index is 24.01 kg/(m^2). Tech Comments:  BS @ 730AM = 124    Nuclear Med Study 1 or 2 day study: 1 day  Stress Test Type:  Stress  Reading MD: Willa Rough, MD  Order Authorizing Provider:  Verne Carrow MD  Resting Radionuclide: Technetium 24m Sestamibi  Resting Radionuclide Dose: 11.0 mCi   Stress Radionuclide:  Technetium 79m Sestamibi  Stress Radionuclide Dose: 33.0 mCi           Stress Protocol Rest HR: 68 Stress HR: 127  Rest BP: 142/80 Stress BP: 181/94  Exercise Time (min): 7:14 METS: 7.70   Predicted Max HR: 143 bpm % Max HR: 88.81 bpm Rate Pressure Product: 41324   Dose of Adenosine (mg):  n/a Dose of Lexiscan: n/a mg  Dose of Atropine (mg): n/a Dose of Dobutamine: n/a mcg/kg/min (at max HR)  Stress Test Technologist: Frederick Peers, EMT-P  Nuclear Technologist:  Domenic Polite, CNMT     Rest Procedure:  Myocardial perfusion imaging was performed at rest 45 minutes following the intravenous administration of Technetium 12m Sestamibi. Rest ECG: Normal sinus rhythm.  Stress Procedure:  The patient exercised on the treadmill utilizing the Bruce Protocol for 7:14 minutes. The patient stopped due to fatigue and denied any chest  pain.  Technetium 96m Sestamibi was injected at peak exercise and myocardial perfusion imaging was performed after a brief delay. Stress ECG: No significant change from baseline ECG  QPS Raw Data Images:  Patient motion noted; appropriate software correction applied. Stress Images:  Normal homogeneous uptake in all areas of the myocardium. Rest Images:  Normal homogeneous uptake in all areas of the myocardium. Subtraction (SDS):  No evidence of ischemia. Transient Ischemic Dilatation (Normal <1.22):  1.05 Lung/Heart Ratio (Normal <0.45):  0.27  Quantitative Gated Spect Images QGS EDV:  38 ml QGS ESV:  8 ml  Impression Exercise Capacity:  Fairly good exercise tolerance for her age. BP Response:  Normal blood pressure response. Clinical Symptoms:  patient had some fatigue. ECG Impression:  No significant ST segment change suggestive of ischemia. Comparison with Prior Nuclear Study: No images to compare  Overall Impression:  Normal stress nuclear study. There is no significant abnormality. This is a low risk scan.  LV Ejection Fraction: 80%.  LV Wall Motion:  Normal Wall Motion.  Willa Rough, MD

## 2012-12-04 DIAGNOSIS — M79609 Pain in unspecified limb: Secondary | ICD-10-CM | POA: Diagnosis not present

## 2012-12-04 DIAGNOSIS — B351 Tinea unguium: Secondary | ICD-10-CM | POA: Diagnosis not present

## 2012-12-10 DIAGNOSIS — L03039 Cellulitis of unspecified toe: Secondary | ICD-10-CM | POA: Diagnosis not present

## 2012-12-16 ENCOUNTER — Other Ambulatory Visit: Payer: Self-pay

## 2012-12-16 DIAGNOSIS — Z1231 Encounter for screening mammogram for malignant neoplasm of breast: Secondary | ICD-10-CM

## 2012-12-19 DIAGNOSIS — H40019 Open angle with borderline findings, low risk, unspecified eye: Secondary | ICD-10-CM | POA: Diagnosis not present

## 2012-12-19 DIAGNOSIS — E11319 Type 2 diabetes mellitus with unspecified diabetic retinopathy without macular edema: Secondary | ICD-10-CM | POA: Diagnosis not present

## 2012-12-19 DIAGNOSIS — Z961 Presence of intraocular lens: Secondary | ICD-10-CM | POA: Diagnosis not present

## 2013-01-12 ENCOUNTER — Ambulatory Visit: Payer: Medicare Other | Admitting: Cardiovascular Disease

## 2013-01-20 ENCOUNTER — Ambulatory Visit
Admission: RE | Admit: 2013-01-20 | Discharge: 2013-01-20 | Disposition: A | Discharge status: Home / Self Care | Payer: Medicare Other | Source: Ambulatory Visit

## 2013-01-20 DIAGNOSIS — Z1231 Encounter for screening mammogram for malignant neoplasm of breast: Secondary | ICD-10-CM

## 2013-02-20 ENCOUNTER — Ambulatory Visit: Payer: Medicare Other | Admitting: Pulmonary Disease

## 2013-03-09 DIAGNOSIS — B351 Tinea unguium: Secondary | ICD-10-CM | POA: Diagnosis not present

## 2013-03-09 DIAGNOSIS — M79609 Pain in unspecified limb: Secondary | ICD-10-CM | POA: Diagnosis not present

## 2013-03-10 ENCOUNTER — Ambulatory Visit (INDEPENDENT_AMBULATORY_CARE_PROVIDER_SITE_OTHER): Payer: Medicare Other | Admitting: Pulmonary Disease

## 2013-03-10 ENCOUNTER — Encounter: Payer: Self-pay | Admitting: Pulmonary Disease

## 2013-03-10 VITALS — BP 128/60 | HR 70 | Temp 98.0°F | Ht 61.0 in | Wt 127.0 lb

## 2013-03-10 DIAGNOSIS — M545 Low back pain: Secondary | ICD-10-CM

## 2013-03-10 DIAGNOSIS — M899 Disorder of bone, unspecified: Secondary | ICD-10-CM

## 2013-03-10 DIAGNOSIS — E7800 Pure hypercholesterolemia, unspecified: Secondary | ICD-10-CM | POA: Diagnosis not present

## 2013-03-10 DIAGNOSIS — E119 Type 2 diabetes mellitus without complications: Secondary | ICD-10-CM | POA: Diagnosis not present

## 2013-03-10 DIAGNOSIS — F411 Generalized anxiety disorder: Secondary | ICD-10-CM

## 2013-03-10 DIAGNOSIS — N318 Other neuromuscular dysfunction of bladder: Secondary | ICD-10-CM

## 2013-03-10 DIAGNOSIS — D126 Benign neoplasm of colon, unspecified: Secondary | ICD-10-CM

## 2013-03-10 DIAGNOSIS — K449 Diaphragmatic hernia without obstruction or gangrene: Secondary | ICD-10-CM

## 2013-03-10 DIAGNOSIS — M1990 Unspecified osteoarthritis, unspecified site: Secondary | ICD-10-CM

## 2013-03-10 DIAGNOSIS — I10 Essential (primary) hypertension: Secondary | ICD-10-CM | POA: Diagnosis not present

## 2013-03-10 DIAGNOSIS — E559 Vitamin D deficiency, unspecified: Secondary | ICD-10-CM

## 2013-03-10 DIAGNOSIS — K589 Irritable bowel syndrome without diarrhea: Secondary | ICD-10-CM

## 2013-03-10 DIAGNOSIS — I872 Venous insufficiency (chronic) (peripheral): Secondary | ICD-10-CM | POA: Diagnosis not present

## 2013-03-10 MED ORDER — ACCU-CHEK FASTCLIX LANCETS MISC: 1.0000 [IU] | Freq: Every day | Status: DC

## 2013-03-10 NOTE — Patient Instructions (Addendum)
Today we updated your med list in our EPIC system...    Continue your current medications the same...    We refilled the meds you requested...  Please return to our lab for your FASTING blood work...    We will contact you w/ the results when available...   Keep up the good work w/ your diet & exercise program...  Call for any questions...  Let's plan a follow up visit in 43mo, sooner if needed for problems.Marland KitchenMarland Kitchen

## 2013-03-13 ENCOUNTER — Other Ambulatory Visit (INDEPENDENT_AMBULATORY_CARE_PROVIDER_SITE_OTHER): Payer: Medicare Other

## 2013-03-13 DIAGNOSIS — F411 Generalized anxiety disorder: Secondary | ICD-10-CM | POA: Diagnosis not present

## 2013-03-13 DIAGNOSIS — E7800 Pure hypercholesterolemia, unspecified: Secondary | ICD-10-CM | POA: Diagnosis not present

## 2013-03-13 DIAGNOSIS — E559 Vitamin D deficiency, unspecified: Secondary | ICD-10-CM

## 2013-03-13 DIAGNOSIS — K449 Diaphragmatic hernia without obstruction or gangrene: Secondary | ICD-10-CM

## 2013-03-13 DIAGNOSIS — I10 Essential (primary) hypertension: Secondary | ICD-10-CM

## 2013-03-13 DIAGNOSIS — E119 Type 2 diabetes mellitus without complications: Secondary | ICD-10-CM

## 2013-03-13 LAB — BASIC METABOLIC PANEL
Calcium: 9.6 mg/dL (ref 8.4–10.5)
Chloride: 102 mEq/L (ref 96–112)
Creatinine, Ser: 0.7 mg/dL (ref 0.4–1.2)
Sodium: 138 mEq/L (ref 135–145)

## 2013-03-13 LAB — CBC WITH DIFFERENTIAL/PLATELET
Basophils Relative: 0.5 % (ref 0.0–3.0)
Eosinophils Absolute: 0.1 10*3/uL (ref 0.0–0.7)
Hemoglobin: 14.4 g/dL (ref 12.0–15.0)
MCHC: 33.7 g/dL (ref 30.0–36.0)
MCV: 90.3 fl (ref 78.0–100.0)
Monocytes Absolute: 0.2 10*3/uL (ref 0.1–1.0)
Neutro Abs: 2.2 10*3/uL (ref 1.4–7.7)
RBC: 4.74 Mil/uL (ref 3.87–5.11)

## 2013-03-13 LAB — HEPATIC FUNCTION PANEL
ALT: 21 U/L (ref 0–35)
Alkaline Phosphatase: 71 U/L (ref 39–117)
Bilirubin, Direct: 0.1 mg/dL (ref 0.0–0.3)
Total Bilirubin: 0.6 mg/dL (ref 0.3–1.2)

## 2013-03-13 LAB — LIPID PANEL
LDL Cholesterol: 119 mg/dL — ABNORMAL HIGH (ref 0–99)
Total CHOL/HDL Ratio: 4
Triglycerides: 103 mg/dL (ref 0.0–149.0)

## 2013-03-15 NOTE — Progress Notes (Addendum)
Subjective:    Patient ID: Kimberly Aguirre, female    DOB: April 19, 1934, 79 y.o.   MRN: [id number]  HPI 79 y/o BF here for a follow up visit...  he has multiple medical problems as noted below...   ~  August 21, 2011:  560mo ROV & she had recent URI treated w/ ZPak & improved; CXR was clear, NAD; had Fluvax 10/12...    BorderlineBP> controlled on diet alone; BP= 120/70 & she denies CP, palpit, dizzy, SOB, edema, etc...    Chol> on fish oil, and refused statin Rx prev; FLP 8/12 actually much improved on diet & she is encouraged to keep up the good work...    DM> on Metform500Bid & Glimep4mg Qam; BS=196, A1c=7.2 and she may need 3rd med (intol to incr Metform)...    GI- HH, IBS, Polyps> stable on OTC meds prn...    GU- OB> symptoms much improved after her surg 12/11 (TVH, ureteral sling) & prev meds stopped...    DJD, LBP, Osteopenia> on Mobic as needed; takes calcium, MVI, VitD; refuses bisphos Rx...  ~  February 19, 2012:  560mo ROV & Kimberly Aguirre has some diffuse aches & pains- eg left arm & left groin area; she says "my left side is my weak side", walking helps, about the same on Mobic, & she notes that "being around other people helps"...  She is also c/o some stress w/ granddaughter & she notes BS is up when she's stressed...  Weight is down 5# to 127# today & labs showed BS=153, A1c=7.1 on Metform+Amaryl...     We reviewed prob list, meds, xrays and labs> see below>> LABS 8/13:  FLP- ok on diet & FishOil;  Chems- ok x BS=153 A1c=7.1;  CBC- ok;  TSH=1.00;  VitD=42; UA= ok  ~  August 21, 2012:  560mo ROV & Kimberly Aguirre notes some tension headaches that she thinks is realted to stress- she takes Tylenol or Goodies w/ relief, & she has Chlorazepate for nerves...     BP controlled on diet alone & measures 100/62 today; she denies CP, palpit, SOB, dizzy, edema; [facility]...    Lipids have been reasonable on diet + Fish Oil therapy & she prefers to avoid statin Rx...    DM  control is acceptable on Metform500Bid & Glimep4/d; BS=84 (denies hypoglycemia) and A1c=7.1; she notes BS is up & down at home; Rec to continue same & monitor for any low sugatrs in case we have to lower the Glimep dose...    GI is stable w/o abd pain, n/v/d, but she notes occas constip- Rec Miralax, Senakot...    Bladder symptoms resolved, control improved...    DJD, LBP> she has heberdens nodes but not c/o much discomfort; back imoproved, prev tried Lyrica- now off... We reviewed prob list, meds, xrays and labs> see below for updates >> she had the 2013 Flu vaccine 10/13... LABS 2/14:  Chems- wnl w/ BS=84 A1c=7.1   ~  March 10, 2013:  560mo ROV & Kimberly Aguirre tells me she is stressed out about her cardiac eval> see below... We reviewed the following medical problems during today's office visit >>     BorderlineBP> controlled on diet alone; BP= 128/60 & she denies CP, palpit, dizzy, SOB, edema, etc...    Chol> on fish oil, and refused statin Rx prev; FLP 8/14 showed TChol 186, TG 103, HDL 47, LDL 119    DM> on [id number] & Glimep4mg Qam; BS=135, A1c=7.5 and she  may need 3rd med => rec to add Onglyza5mg /d...    GI- HH, IBS, Polyps> stable on OTC meds prn; last colonoscopy 2005 w/ 2 sm polyps & overdue for f/u procedure...    GU- OB> symptoms much improved after her surg 12/11 (TVH, ureteral sling) & prev meds stopped...    DJD, LBP, Osteopenia> on Mobic as needed; takes calcium, MVI, VitD; refuses bisphos Rx...    Anxiety> stress from family (grand-daughter) & on Tranxene7.5 prn... We reviewed prob list, meds, xrays and labs> see below for updates >>   ADDENDUM>> 9/2 we called pt w/ results: BS=135 A1c=7.5 and wanted to add Onglyza5 but she declined additional med stating that she wasn't taking the Glimep4 "since it dropped my sugar too bad"; she never mentioned this during the OV & after discussion it is rec that she take the Metform500Bid & Glimep4-1/2 tab every AM w/ ROV recheck labs in 3-84mo...             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 birth defect, she had eye surgery age 63, followed by DrHecker with mild bilat diabetic retinopathy reported 2/11 (she plans f/u in 20mo).  ESSENTIAL HYPERTENSION, BORDERLINE (ICD-401.9) - controlled on diet... ~  2/13:  BP= 114/72 & denies HA, fatigue, visual changes, CP, palipit, dizziness, syncope, dyspnea, edema, etc... ~  CXR 2/13 showed heart at upper lim normal, min bronchitic changes, clear, NAD ~  8/13:  BP= 112/68 & she denies CP, palpit, SOB, edema, etc;  K=3.8, BUN=14, Creat=0.8 ~  2/14: BP controlled on diet alone & measures 100/62 today; she denies CP, palpit, SOB, dizzy, edema; she [facility].  ~  controlled on diet alone; BP= 128/60 & she denies CP, palpit, dizzy, SOB, edema, etc...  CHEST PAIN, ATYPICAL (ICD-786.59) - hx atypical CP 2010> sharp, right chest, noticed at night & not days, 2-4/10 severity, no assoc n/v/diaphoresis, resolves spont in a few minutes... baseline EKG= NSR, WNL.[name]. had NuclearStressTest Dec00 which was neg- no infarct or ischemia, EF= 77%... no known coronary dis, but mult risk factors= DM, Chol, +FamHx strokes... [facility]...   VENOUS INSUFFICIENCY (ICD-459.81) - follows low sodium diet, elevates legs, support hose... no swelling.  HYPERCHOLESTEROLEMIA (ICD-272.0) - on FISH OIL daily, she stopped her prev Simvastatin 20mg  on her own... refuses statin therapy due to pain and swelling in her ankles, and she just wants Diet Rx alone... ~  FLP 8/08 showed TChol 147, TG 102, HDL 35, LDL 92... ? on her simvastatin 20mg /d. ~  FLP 8/09 showed TChol 163, TG 129, HDL 29, LDL 109... she prefers diet alone- discussed! ~  FLP 8/10 showed TChol 211, Tg 187, HDL 41, LDL 146...offered Prav40 w/ hope fewer side effects. ~  1/11: she reports trial Prav40 but stopped this due to "cramping in joints, esp ankles"... ~  FLP 8/11 on diet alone  showed TChol 189, TG 155, HDL 40, LDL 118... "I'm committed to diet & ex" ~  FLP 2/12 showed TChol 220, TG 182, HDL 44, LDL 157... she confirms her refusal of statin rx. ~  FLP 8/12 showed TChol 179, TG 116, HDL 47, LDL 109... Much improved on diet, continue same! ~  FLP 8/13 showed TChol 177, TG 93, HDL 49, LDL 109 ~  FLP 8/14 showed TChol 186, TG 103, HDL 47, LDL 119   DIABETES MELLITUS (ICD-250.00) - on METFORMIN 500mg Bid & AMARYL  4mg /d...  ~  labs 12/08 showed BS=101, HgA1c=7.3.Marland KitchenMarland Kitchen rec to add Actos but pt didn't fill this Rx. ~  labs 8/09 showed BS= 146, HgA1c= 7.4.Marland KitchenMarland Kitchen rec incr Metform 2Bid + Amaryl 4mg /d...  ~  note- pt states intol to Metform 2Bid and had to decr back to 1Bid + the Glimep... ~  labs 2/10 showed BS= 53, A1c= 7.0.Marland KitchenMarland Kitchen rec same meds, better diet, may need decr Glimep. ~  labs 8/10 showed BS= 127, A1c= 7.0.[name]. rec> keep same, better diet. ~  1/11: she reports BS at home all in the 100-140+ range... ~  labs 8/11 on Metfor500Bid+Glim4 showed BS= 151, A1c= 7.2 ~  labs 2/12 showed BS= 158, A1c= 7.2.Marland KitchenMarland Kitchen continue same + better diet. ~  Labs 8/12 showed BS= 129, A1c= 7.0.Marland KitchenMarland Kitchen Improved> same meds + diet! ~  Labs 2/13 on Metform500Bid+Glimep4 showed BS= 196, A1c= 7.2... Next step add 3rd med. ~  Labs 8/13 on Metform500Bid+Glimep4 showed BS= 153, A1c= 7.1 ~  Labs 2/14 on Metform500Bid+Glim4 showed BS= 84, A1c= 7.1 ~  Labs 8/14 on Metform500Bid+Glim4 (not!) showed BS= 135, A1c= 7.5.Marland KitchenMarland Kitchen rec to add ONGLYZA5, then she admitted to not taking the Glim4 due to low sugars, so take Glim4- 1/2 tab daily!  HIATAL HERNIA (ICD-553.3) - last EGD was 1980 showing 2cmHH, mild esophagitis and gastritis...  IRRITABLE BOWEL SYNDROME (ICD-564.1) - she uses Miralax, Senakot as needed...  COLONIC POLYPS (ICD-211.3) - last colonoscopy 7/05 by DrBrodie showed 2-43mm polyps... f/u planned 52yrs. ~  8/12: had pain in left groin area on & off w/ eval from GYN & given Ibuprofen which helped... ~  8/14: she has not had  f/u colon & we will refer to DrDBrodie for recheck...  OVERACTIVE BLADDER (ICD-596.51) - she notes symptoms c/w overactive bladder 8/11 & we discussed trial of Toviaz... symptoms much improved after surg 12/11 by drDickstein & [facility] & ureteral sling) and Toviaz stopped...  DEGENERATIVE JOINT DISEASE (ICD-715.90) - right hip DJD per DrGioffre w/ shot in 2006... repeat eval Spring2011 and nothing found according to the pt "I'm his mystery pt" she says... takes MOBIC 7.5mg  Prn w/ some relief...  BACK PAIN, LUMBAR (ICD-724.2) - she prev had some leg pain at night, ?neuropathy and tried Lyrica... ~  1/11:  denies back pain etc...  OSTEOPENIA (ICD-733.90) - on OSCAL Bid + vits daily, she stopped Fosamax on her own & states intol to bisphosphonates...  ~  BMD 5/08 showed TScores -1.1 to -1.5.[name]. & VitD level=64 in 8/08... rec to start Fosamax- pt refused. ~  BMD 9/11 showed TScores -2.0 in Spine, and -1.9 in left FemNeck... pt refuses bisphos rx. ~  she has osteopenia followed by GYN but pt refuses bisphos therapy- on calcium, vits.  ~  Labs 8/14 showed Vit D level = 52  HEADACHE (ICD-784.0)  ANXIETY (ICD-300.00) - she uses CHLORAZEPATE 7.5mg  Prn; under stress w/ grand daughter; med refilled...  SCREENING STUDIES & HEALTH MAINT:  ~  NuclearStressTest 12/00 was neg for infarct or ischemia, EF=77%... ~  Pneumovax shot 2003 at age 8 ~  Tetanus shot 2004 w/ local inflamm reaction. ~  GYN= DrDickstein et al...   Past Surgical History  Procedure Laterality Date  . Eye surgery      age 34  . Vaginal hysterectomy      and ureteral sling 06/2010 by Dr. Elvera Bicker    Outpatient Encounter Prescriptions as of 03/10/2013  Medication Sig Dispense Refill  . ACCU-CHEK AVIVA PLUS test strip TEST AS DIRECTED UP  TO 2 TIMES A DAY  100 strip  6  . ACCU-CHEK FASTCLIX LANCETS MISC 1 Units by Other route daily. Test blood sugar once daily.  Dx  250.00  102 each  6  . aspirin 81 MG tablet Take 81 mg by  mouth daily.        . betamethasone dipropionate (DIPROLENE) 0.05 % ointment Apply topically as needed.      . Blood Glucose Monitoring Suppl (ACCU-CHEK AVIVA PLUS) W/DEVICE KIT 1 Device by Does not apply route as directed.  1 kit  0  . calcium-vitamin D (OSCAL WITH D) 500-200 MG-UNIT per tablet Take 1 tablet by mouth 2 times per week      . clorazepate (TRANXENE) 7.5 MG tablet Take 1 tablet (7.5 mg total) by mouth 3 (three) times daily.  90 tablet  5  . fish oil-omega-3 fatty acids 1000 MG capsule Take 1 g by mouth daily.        [name] glimepiride (AMARYL) 4 MG tablet take 1 tablet once daily every morning  30 tablet  6  . meloxicam (MOBIC) 7.5 MG tablet take 1 tablet by mouth once daily if needed for ARTHRITIS PAIN  30 tablet  11  . metFORMIN (GLUCOPHAGE) 500 MG tablet Take 1 tablet (500 mg total) by mouth 2 (two) times daily.  60 tablet  11  . Multiple Vitamins-Minerals (CENTRUM SILVER PO) Take 1 tablet by mouth daily.        . [DISCONTINUED] ACCU-CHEK FASTCLIX LANCETS MISC 1 Units by Other route daily. Test blood sugar once daily.    Dx  250.00  102 each  6   No facility-administered encounter medications on file as of 03/10/2013.    Allergies  Allergen Reactions  . Codeine     REACTION: nausea    Current Medications, Allergies, Past Medical History, Past Surgical History, Family History, and Social History were reviewed in [employer] record.   Review of Systems    See HPI - all other systems neg except as noted...  The patient denies anorexia, fever, weight loss, weight gain, vision loss, decreased hearing, hoarseness, chest pain, syncope, dyspnea on exertion, peripheral edema, prolonged cough, headaches, hemoptysis, abdominal pain, melena, hematochezia, severe indigestion/heartburn, hematuria, incontinence, muscle weakness, suspicious skin lesions, transient blindness, difficulty walking, depression, unusual weight change, abnormal bleeding, enlarged lymph nodes,  and angioedema.   Objective:   Physical Exam     WD, WN, 79 y/o BF in NAD... GENERAL:  Alert & oriented; pleasant & cooperative... HEENT:  /AT, Ophthal per DrHecker, EACs-clear, TMs-wnl, NOSE-clear, THROAT-clear & wnl. NECK:  Supple w/ fairROM; no JVD; normal carotid impulses w/o bruits; no thyromegaly or nodules palpated; no lymphadenopathy. CHEST:  Clear to P & A; without wheezes/ rales/ or rhonchi heard... HEART:  Regular Rhythm; without murmurs/ rubs/ or gallops detected... ABDOMEN:  Soft & nontender; normal bowel sounds; no organomegaly or masses palpated, no guarding, or rebound. ... EXT: without deformities, mod arthritic changes; no varicose veins/ +venous insuffic/ no edema. some decr ROM left hip... NEURO:  CN's intact; motor testing normal; sensory testing normal & monofilament test= norm; gait normal & balance OK. DERM:  No lesions noted; no rash etc...  RADIOLOGY DATA:  Reviewed in the EPIC EMR & discussed w/ the patient...  LABORATORY DATA:  Reviewed in the EPIC EMR & discussed w/ the patient...   Assessment & Plan:    HBP>  BP is well regulated on diet alone- she avoids sodium & has been  working on wt reduction...  VI>  Stable, she knows to avoid sodium, elevate legs, wear support hose...  CHOL>  She refuses statin meds; fortunately her recent diet efforts have been rewarded w/ much better lipid numbers, keep up the good work...  DM>  On Metformin Bid & Glimep 4mg Qam, plus diet etc;  Labs w/ BS=135, A1c 7.5> rec to incr meds add Onglyza5 but then she adm to NOT taking the glim4 due to low sugars so try 1/2 tab every day.  GI> HH, IBS, Polyps>  Stable, no changes... Refer to GI for f/u colon...  DJD, LBP>  Stable on NSAID analgesics...  Osteopenia>  She still refuses Bisphos rx; continue calcium, MVI, Vit D supplementation...  Anxiety>  Under stress w/ grand daughter... We refilled her Tranxene- she notes some tension/ mixed HAs & uses tylenol/ Goodies (her  choice).   Patient's Medications  New Prescriptions   No medications on file  Previous Medications   ACCU-CHEK AVIVA PLUS TEST STRIP    TEST AS DIRECTED UP TO 2 TIMES A DAY   ASPIRIN 81 MG TABLET    Take 81 mg by mouth daily.     BETAMETHASONE DIPROPIONATE (DIPROLENE) 0.05 % OINTMENT    Apply topically as needed.   BLOOD GLUCOSE MONITORING SUPPL (ACCU-CHEK AVIVA PLUS) W/DEVICE KIT    1 Device by Does not apply route as directed.   CALCIUM-VITAMIN D (OSCAL WITH D) 500-200 MG-UNIT PER TABLET    Take 1 tablet by mouth 2 times per week   CLORAZEPATE (TRANXENE) 7.5 MG TABLET    Take 1 tablet (7.5 mg total) by mouth 3 (three) times daily.   FISH OIL-OMEGA-3 FATTY ACIDS 1000 MG CAPSULE    Take 1 g by mouth daily.     GLIMEPIRIDE (AMARYL) 4 MG TABLET    take 1 tablet once daily every morning   MELOXICAM (MOBIC) 7.5 MG TABLET    take 1 tablet by mouth once daily if needed for ARTHRITIS PAIN   METFORMIN (GLUCOPHAGE) 500 MG TABLET    Take 1 tablet (500 mg total) by mouth 2 (two) times daily.   MULTIPLE VITAMINS-MINERALS (CENTRUM SILVER PO)    Take 1 tablet by mouth daily.    Modified Medications   Modified Medication Previous Medication   ACCU-CHEK FASTCLIX LANCETS MISC ACCU-CHEK FASTCLIX LANCETS MISC      1 Units by Other route daily. Test blood sugar once daily.  Dx  250.00    1 Units by Other route daily. Test blood sugar once daily.    Dx  250.00  Discontinued Medications   No medications on file

## 2013-03-17 ENCOUNTER — Telehealth: Payer: Self-pay | Admitting: Pulmonary Disease

## 2013-03-17 MED ORDER — PREGABALIN 50 MG PO CAPS: 50.0000 mg | ORAL_CAPSULE | Freq: Every day | ORAL | Status: DC

## 2013-03-17 NOTE — Telephone Encounter (Signed)
i spoke with pt and is aware of recs. Rx has been called in.

## 2013-03-17 NOTE — Telephone Encounter (Signed)
Per SN---  Take the metformin 500 mg  Bid every day Glimepiride 4 mg  1/2 tablet every morning every day Watch BS at home and call for any problems  ROV with SN in 3-4 months to recheck labs on both meds regularly  For burning in her feet try lyrica 50 mg  1 po qhs  #30 with 1 refill.

## 2013-03-17 NOTE — Telephone Encounter (Signed)
Please notify patient>  FLP on diet is ok but LDL=119 & needs better low chol low fat diet... Chems ok x BS=135 A1c=7.5 on Metform&Glimep- rec to add ONGLYZA5 one tab daily... CBC, LFTs, Thyroid, VitD> all wnl...  I spoke with the pt and she states that she is concerned about taking another diabetic medication because she states when she takes the metformin and glimepiride in the morning  for the last few days she has felt tired and lightheaded and she checked her BS and it was low 60's. Pt is afraid that if she takes another med it will go even lower. Please advise. Carron Curie, CMA

## 2013-03-23 ENCOUNTER — Other Ambulatory Visit: Payer: Self-pay | Admitting: Pulmonary Disease

## 2013-03-23 DIAGNOSIS — D126 Benign neoplasm of colon, unspecified: Secondary | ICD-10-CM

## 2013-04-08 ENCOUNTER — Encounter: Payer: Self-pay | Admitting: Internal Medicine

## 2013-04-15 ENCOUNTER — Other Ambulatory Visit: Payer: Self-pay | Admitting: Pulmonary Disease

## 2013-04-17 ENCOUNTER — Telehealth: Payer: Self-pay | Admitting: Pulmonary Disease

## 2013-04-17 NOTE — Telephone Encounter (Signed)
Attempted to call pt but phone line is busy.  Will try back later.

## 2013-04-17 NOTE — Telephone Encounter (Signed)
Spoke to pt. She states that when she went to pick up her testing strips that pharmacy told her that insurance wasn't going to pay. Pt had to pay $40 for her testing strips. She was under the impression that she was using to many strips and had gotten herself in trouble.  According to [facility], when they tried to run her Medicare part B it had been dropped from her Medicare plan. Her strips were then pushed out to Medicare part D which is why she was charged.  I advised the pt to call Medicare and figure out why her part B was dropped. She agreed and verbalized understanding.

## 2013-04-17 NOTE — Telephone Encounter (Signed)
[facility] on [address]

## 2013-04-21 ENCOUNTER — Telehealth: Payer: Self-pay | Admitting: Pulmonary Disease

## 2013-04-21 MED ORDER — GLUCOSE BLOOD VI STRP: ORAL_STRIP | Status: DC

## 2013-04-21 NOTE — Telephone Encounter (Signed)
I spoke with pt. Unless she is on insulin insurance will only pay for her to test once daily per pharmacy. She stated her RX for strips stated to test once daily and the lancets stated 1 daily. She is confused on how often to test. She had to pay out of pocket for this last refill bc her insurance will not pay for twice daily. She stated we need to fix this at the pharmacy. I advised will do so. Nothing further needed

## 2013-04-27 ENCOUNTER — Ambulatory Visit (INDEPENDENT_AMBULATORY_CARE_PROVIDER_SITE_OTHER): Payer: Medicare Other

## 2013-04-27 DIAGNOSIS — Z23 Encounter for immunization: Secondary | ICD-10-CM

## 2013-05-01 ENCOUNTER — Other Ambulatory Visit: Payer: Self-pay | Admitting: Pulmonary Disease

## 2013-05-04 ENCOUNTER — Telehealth: Payer: Self-pay | Admitting: Pulmonary Disease

## 2013-05-04 MED ORDER — MELOXICAM 7.5 MG PO TABS: ORAL_TABLET | ORAL | Status: DC

## 2013-05-04 NOTE — Telephone Encounter (Signed)
Refill has been sent to the pharmacy.  Called and lmom to make the pt aware. Nothing further is needed.

## 2013-05-22 ENCOUNTER — Telehealth: Payer: Self-pay | Admitting: Pulmonary Disease

## 2013-05-22 NOTE — Telephone Encounter (Signed)
Per pharmacy unless pt is on insulin they will only cover for her to test once daily only.  I called and made pt aware. Nothing further needed

## 2013-06-01 ENCOUNTER — Encounter: Payer: Self-pay | Admitting: Podiatry

## 2013-06-01 ENCOUNTER — Ambulatory Visit (INDEPENDENT_AMBULATORY_CARE_PROVIDER_SITE_OTHER): Payer: Medicare Other | Admitting: Podiatry

## 2013-06-01 VITALS — BP 110/62 | HR 86 | Resp 20 | Ht 61.0 in | Wt 127.0 lb

## 2013-06-01 DIAGNOSIS — B351 Tinea unguium: Secondary | ICD-10-CM

## 2013-06-01 DIAGNOSIS — M79609 Pain in unspecified limb: Secondary | ICD-10-CM

## 2013-06-01 NOTE — Progress Notes (Signed)
Patient ID: Kimberly Aguirre, female   DOB: 01/15/1934, 79 y.o.   MRN: [id number] Subjective: This 79-year-old black female, diabetic presents for ongoing debridement of painful mycotic toenails an approximately three-month intervals.   Objective: Discolored, deformed toenails with texture and color changes and palpable tenderness in all nail plates x10.   Assessment: Symptomatic onychomycoses x10  Diabetes   Plan: All 10 toenails debrided back without any bleeding. Reappoint at three-month intervals.  

## 2013-06-04 ENCOUNTER — Other Ambulatory Visit: Payer: Self-pay | Admitting: Pulmonary Disease

## 2013-06-16 ENCOUNTER — Other Ambulatory Visit: Payer: Self-pay | Admitting: Pulmonary Disease

## 2013-06-22 ENCOUNTER — Ambulatory Visit (INDEPENDENT_AMBULATORY_CARE_PROVIDER_SITE_OTHER): Payer: Medicare Other | Admitting: Pulmonary Disease

## 2013-06-22 ENCOUNTER — Encounter: Payer: Self-pay | Admitting: Pulmonary Disease

## 2013-06-22 ENCOUNTER — Other Ambulatory Visit (INDEPENDENT_AMBULATORY_CARE_PROVIDER_SITE_OTHER): Payer: Medicare Other

## 2013-06-22 VITALS — BP 108/64 | HR 77 | Temp 98.0°F | Ht 61.0 in | Wt 129.2 lb

## 2013-06-22 DIAGNOSIS — I872 Venous insufficiency (chronic) (peripheral): Secondary | ICD-10-CM | POA: Diagnosis not present

## 2013-06-22 DIAGNOSIS — M899 Disorder of bone, unspecified: Secondary | ICD-10-CM

## 2013-06-22 DIAGNOSIS — K589 Irritable bowel syndrome without diarrhea: Secondary | ICD-10-CM

## 2013-06-22 DIAGNOSIS — M1990 Unspecified osteoarthritis, unspecified site: Secondary | ICD-10-CM

## 2013-06-22 DIAGNOSIS — E119 Type 2 diabetes mellitus without complications: Secondary | ICD-10-CM | POA: Diagnosis not present

## 2013-06-22 DIAGNOSIS — Z23 Encounter for immunization: Secondary | ICD-10-CM

## 2013-06-22 DIAGNOSIS — I10 Essential (primary) hypertension: Secondary | ICD-10-CM | POA: Diagnosis not present

## 2013-06-22 DIAGNOSIS — K449 Diaphragmatic hernia without obstruction or gangrene: Secondary | ICD-10-CM

## 2013-06-22 DIAGNOSIS — D126 Benign neoplasm of colon, unspecified: Secondary | ICD-10-CM

## 2013-06-22 DIAGNOSIS — F411 Generalized anxiety disorder: Secondary | ICD-10-CM

## 2013-06-22 DIAGNOSIS — E7800 Pure hypercholesterolemia, unspecified: Secondary | ICD-10-CM

## 2013-06-22 DIAGNOSIS — M545 Low back pain: Secondary | ICD-10-CM

## 2013-06-22 DIAGNOSIS — N318 Other neuromuscular dysfunction of bladder: Secondary | ICD-10-CM

## 2013-06-22 LAB — HEMOGLOBIN A1C: Hgb A1c MFr Bld: 7.4 % — ABNORMAL HIGH (ref 4.6–6.5)

## 2013-06-22 LAB — BASIC METABOLIC PANEL
CO2: 29 mEq/L (ref 19–32)
Calcium: 9.6 mg/dL (ref 8.4–10.5)
Creatinine, Ser: 0.7 mg/dL (ref 0.4–1.2)
GFR: 99.23 mL/min (ref >60.00)
Glucose, Bld: 116 mg/dL — ABNORMAL HIGH (ref 70–99)
Potassium: 3.9 mEq/L (ref 3.5–5.1)
Sodium: 137 mEq/L (ref 135–145)

## 2013-06-22 NOTE — Progress Notes (Signed)
Subjective:    Patient ID: Kimberly Aguirre, female    DOB: Dec 03, 1933, 79 y.o.   MRN: [id number]  HPI 79 y/o BF here for a follow up visit...  he has multiple medical problems as noted below...   ~  August 21, 2011:  23mo ROV & she had recent URI treated w/ ZPak & improved; CXR was clear, NAD; had Fluvax 10/12...    BorderlineBP> controlled on diet alone; BP= 120/70 & she denies CP, palpit, dizzy, SOB, edema, etc...    Chol> on fish oil, and refused statin Rx prev; FLP 8/12 actually much improved on diet & she is encouraged to keep up the good work...    DM> on Metform500Bid & Glimep4mg Qam; BS=196, A1c=7.2 and she may need 3rd med (intol to incr Metform)...    GI- HH, IBS, Polyps> stable on OTC meds prn...    GU- OB> symptoms much improved after her surg 12/11 (TVH, ureteral sling) & prev meds stopped...    DJD, LBP, Osteopenia> on Mobic as needed; takes calcium, MVI, VitD; refuses bisphos Rx...  ~  February 19, 2012:  23mo ROV & Kimberly Aguirre has some diffuse aches & pains- eg left arm & left groin area; she says "my left side is my weak side", walking helps, about the same on Mobic, & she notes that "being around other people helps"...  She is also c/o some stress w/ granddaughter & she notes BS is up when she's stressed...  Weight is down 5# to 127# today & labs showed BS=153, A1c=7.1 on Metform+Amaryl...     We reviewed prob list, meds, xrays and labs> see below>> LABS 8/13:  FLP- ok on diet & FishOil;  Chems- ok x BS=153 A1c=7.1;  CBC- ok;  TSH=1.00;  VitD=42; UA= ok  ~  August 21, 2012:  23mo ROV & Kimberly Aguirre notes some tension headaches that she thinks is realted to stress- she takes Tylenol or Goodies w/ relief, & she has Chlorazepate for nerves...     BP controlled on diet alone & measures 100/62 today; she denies CP, palpit, SOB, dizzy, edema; [facility]...    Lipids have been reasonable on diet + Fish Oil therapy & she prefers to avoid statin Rx...    DM  control is acceptable on Metform500Bid & Glimep4/d; BS=84 (denies hypoglycemia) and A1c=7.1; she notes BS is up & down at home; Rec to continue same & monitor for any low sugatrs in case we have to lower the Glimep dose...    GI is stable w/o abd pain, n/v/d, but she notes occas constip- Rec Miralax, Senakot...    Bladder symptoms resolved, control improved...    DJD, LBP> she has heberdens nodes but not c/o much discomfort; back imoproved, prev tried Lyrica- now off... We reviewed prob list, meds, xrays and labs> see below for updates >> she had the 2013 Flu vaccine 10/13... LABS 2/14:  Chems- wnl w/ BS=84 A1c=7.1   ~  March 10, 2013:  23mo ROV & Kimberly Aguirre tells me she is stressed out about her cardiac eval> see below... We reviewed the following medical problems during today's office visit >>     BorderlineBP> controlled on diet alone; BP= 128/60 & she denies CP, palpit, dizzy, SOB, edema, etc...    Chol> on fish oil, and refused statin Rx prev; FLP 8/14 showed TChol 186, TG 103, HDL 47, LDL 119    DM> on [id number] & Glimep4mg Qam; BS=135, A1c=7.5 and she  may need 3rd med => rec to add Onglyza5mg /d...    GI- HH, IBS, Polyps> stable on OTC meds prn; last colonoscopy 2005 w/ 2 sm polyps & overdue for f/u procedure...    GU- OB> symptoms much improved after her surg 12/11 (TVH, ureteral sling) & prev meds stopped...    DJD, LBP, Osteopenia> on Mobic as needed; takes calcium, MVI, VitD; refuses bisphos Rx...    Anxiety> stress from family (grand-daughter) & on Tranxene7.5 prn... We reviewed prob list, meds, xrays and labs> see below for updates >>  ADDENDUM>> 9/2 we called pt w/ results: BS=135 A1c=7.5 and wanted to add Onglyza5 but she declined additional med stating that she wasn't taking the Glimep4 "since it dropped my sugar too bad"; she never mentioned this during the OV & after discussion it is rec that she take the Metform500Bid & Glimep4-1/2 tab every AM w/ ROV recheck labs in 3-71mo...  ~   June 22, 2013:  3-64mo ROV & recheck> when seen in Aug2014 her BS=135 but A1c=7.5 and we rec to add 3rd med (Onglyza) but then she admitted to NOT taking the Glimep4, so we compromised & added back the Glimep4mg - 1/2 tab Qam (f/u labs pending today); she now tells me that she is only taking the Glim2mg  about 5/7 days each week & doesn't like to take it if FBS<130 because she feels it is too low; she did not bring list of BS checks today...  BP is well controlled on diet alone & measures 110/64 today...  FLP was acceptable but LDL=119 [id number] & we stressed diet & wt reduction (since then she has gained 2#)...  Gi/ GU/ & Ortho are stable but she notes some left groin pain for which she has seen [facility] x2 in the past, asked to f/u w/ them but she notes it's not that bad yet...  NOTE:  Epic says she has not had Pneumovax so we gave her the 23 valent vaccine today...  LABS 12/14:  Chems showed ok x BS= 116, A1c=7.4 om Metform500Bid & Glim4-1/2 daily (continue same DAILY + better diet)...             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 birth defect, she had eye surgery age 58, followed by DrHecker with mild bilat diabetic retinopathy reported 2/11 (she plans f/u in 56mo).  ESSENTIAL HYPERTENSION, BORDERLINE (ICD-401.9) - controlled on diet... ~  2/13:  BP= 114/72 & denies HA, fatigue, visual changes, CP, palipit, dizziness, syncope, dyspnea, edema, etc... ~  CXR 2/13 showed heart at upper lim normal, min bronchitic changes, clear, NAD ~  8/13:  BP= 112/68 & she denies CP, palpit, SOB, edema, etc;  K=3.8, BUN=14, Creat=0.8 ~  2/14: BP controlled on diet alone & measures 100/62 today; she denies CP, palpit, SOB, dizzy, edema; she [facility].  ~  8/14 & 12/14: controlled on diet alone; BP= 128/60 or better & she denies CP, palpit, dizzy, SOB, edema, etc...  CHEST PAIN, ATYPICAL (ICD-786.59) - hx atypical CP 2010> sharp, right chest, noticed at  night & not days, 2-4/10 severity, no assoc n/v/diaphoresis, resolves spont in a few minutes... baseline EKG= NSR, WNL.[name]. had NuclearStressTest Dec00 which was neg- no infarct or ischemia, EF= 77%... no known coronary dis, but mult risk factors= DM, Chol, +FamHx strokes... [facility] & [facility]...   VENOUS INSUFFICIENCY (ICD-459.81) - follows low sodium diet, elevates legs, support hose.Marland KitchenMarland Kitchen  no swelling.  HYPERCHOLESTEROLEMIA (ICD-272.0) - on FISH OIL daily, she stopped her prev Simvastatin 20mg  on her own... refuses statin therapy due to pain and swelling in her ankles, and she just wants Diet Rx alone... ~  FLP 8/08 showed TChol 147, TG 102, HDL 35, LDL 92... ? on her simvastatin 20mg /d. ~  FLP 8/09 showed TChol 163, TG 129, HDL 29, LDL 109... she prefers diet alone- discussed! ~  FLP 8/10 showed TChol 211, Tg 187, HDL 41, LDL 146...offered Prav40 w/ hope fewer side effects. ~  1/11: she reports trial Prav40 but stopped this due to "cramping in joints, esp ankles"... ~  FLP 8/11 on diet alone showed TChol 189, TG 155, HDL 40, LDL 118... "I'm committed to diet & ex" ~  FLP 2/12 showed TChol 220, TG 182, HDL 44, LDL 157... she confirms her refusal of statin rx. ~  FLP 8/12 showed TChol 179, TG 116, HDL 47, LDL 109... Much improved on diet, continue same! ~  FLP 8/13 showed TChol 177, TG 93, HDL 49, LDL 109 ~  FLP 8/14 showed TChol 186, TG 103, HDL 47, LDL 119   DIABETES MELLITUS (ICD-250.00) - on METFORMIN 500mg Bid & AMARYL 4mg /d...  ~  labs 12/08 showed BS=101, HgA1c=7.3.Marland KitchenMarland Kitchen rec to add Actos but pt didn't fill this Rx. ~  labs 8/09 showed BS= 146, HgA1c= 7.4.Marland KitchenMarland Kitchen rec incr Metform 2Bid + Amaryl 4mg /d...  ~  note- pt states intol to Metform 2Bid and had to decr back to 1Bid + the Glimep... ~  labs 2/10 showed BS= 53, A1c= 7.0.Marland KitchenMarland Kitchen rec same meds, better diet, may need decr Glimep. ~  labs 8/10 showed BS= 127, A1c= 7.0.[name]. rec> keep same, better diet. ~  1/11: she reports BS at home all  in the 100-140+ range... ~  labs 8/11 on Metfor500Bid+Glim4 showed BS= 151, A1c= 7.2 ~  labs 2/12 showed BS= 158, A1c= 7.2.Marland KitchenMarland Kitchen continue same + better diet. ~  Labs 8/12 showed BS= 129, A1c= 7.0.Marland KitchenMarland Kitchen Improved> same meds + diet! ~  Labs 2/13 on Metform500Bid+Glimep4 showed BS= 196, A1c= 7.2... Next step add 3rd med. ~  Labs 8/13 on Metform500Bid+Glimep4 showed BS= 153, A1c= 7.1 ~  Labs 2/14 on Metform500Bid+Glim4 showed BS= 84, A1c= 7.1 ~  Labs 8/14 on Metform500Bid+Glim4 (not!) showed BS= 135, A1c= 7.5.Marland KitchenMarland Kitchen rec to add ONGLYZA5, then she admitted to not taking the Glim4 due to low sugars, so take Glim4- 1/2 tab daily! ~  Labs 12/14 on Metform500Bid+Glim2 (taking5/7d) showed BS= 116, A1c= 7.4 and take it daily!  HIATAL HERNIA (ICD-553.3) - last EGD was 1980 showing 2cmHH, mild esophagitis and gastritis...  IRRITABLE BOWEL SYNDROME (ICD-564.1) - she uses Miralax, Senakot as needed...  COLONIC POLYPS (ICD-211.3) - last colonoscopy 7/05 by DrBrodie showed 2-12mm polyps... f/u planned 22yrs. ~  8/12: had pain in left groin area on & off w/ eval from GYN & given Ibuprofen which helped... ~  8/14: she has not had f/u colon & we will refer to DrDBrodie for recheck...  OVERACTIVE BLADDER (ICD-596.51) - she notes symptoms c/w overactive bladder 8/11 & we discussed trial of Toviaz... symptoms much improved after surg 12/11 by drDickstein & [facility] & ureteral sling) and Toviaz stopped...  DEGENERATIVE JOINT DISEASE (ICD-715.90) - right hip DJD per DrGioffre w/ shot in 2006... repeat eval Spring2011 and nothing found according to the pt "I'm his mystery pt" she says... takes MOBIC 7.5mg  Prn w/ some relief...  BACK PAIN, LUMBAR (ICD-724.2) - she prev had some leg  pain at night, ?neuropathy and tried Lyrica... ~  1/11:  denies back pain etc...  OSTEOPENIA (ICD-733.90) - on OSCAL Bid + vits daily, she stopped Fosamax on her own & states intol to bisphosphonates...  ~  BMD 5/08 showed TScores -1.1 to -1.5.[name]. &  VitD level=64 in 8/08... rec to start Fosamax- pt refused. ~  BMD 9/11 showed TScores -2.0 in Spine, and -1.9 in left FemNeck... pt refuses bisphos rx. ~  she has osteopenia followed by GYN but pt refuses bisphos therapy- on calcium, vits.  ~  Labs 8/14 showed Vit D level = 52  HEADACHE (ICD-784.0)  ANXIETY (ICD-300.00) - she uses CHLORAZEPATE 7.5mg  Prn; under stress w/ grand daughter; med refilled...  SCREENING STUDIES & HEALTH MAINT:  ~  NuclearStressTest 12/00 was neg for infarct or ischemia, EF=77%... ~  Pneumovax shot 2003 at age 62; we gave her a second vaccination 12/14 per epic notification... ~  Tetanus shot 2004 w/ local inflamm reaction. ~  GYN= DrDickstein et al...   Past Surgical History  Procedure Laterality Date  . Eye surgery      age 83  . Vaginal hysterectomy      and ureteral sling 06/2010 by Dr. Elvera Bicker    Outpatient Encounter Prescriptions as of 06/22/2013  Medication Sig  . ACCU-CHEK FASTCLIX LANCETS MISC 1 Units by Other route daily. Test blood sugar once daily.  Dx  250.00  . aspirin 81 MG tablet Take 81 mg by mouth daily.    . betamethasone dipropionate (DIPROLENE) 0.05 % ointment apply to affected area twice a day  . Blood Glucose Monitoring Suppl (ACCU-CHEK AVIVA PLUS) W/DEVICE KIT 1 Device by Does not apply route as directed.  . calcium-vitamin D (OSCAL WITH D) 500-200 MG-UNIT per tablet Take 1 tablet by mouth 2 times per week  . clorazepate (TRANXENE) 7.5 MG tablet take 1 tablet by mouth three times a day if needed  . fish oil-omega-3 fatty acids 1000 MG capsule Take 1 g by mouth daily.    [name] glimepiride (AMARYL) 4 MG tablet take 1/2  tablet once daily every morning  . glucose blood (ACCU-CHEK AVIVA PLUS) test strip TEST UP TO once daily  . meloxicam (MOBIC) 7.5 MG tablet take 1 tablet by mouth once daily if needed for ARTHRITIS PAIN  . metFORMIN (GLUCOPHAGE) 500 MG tablet take 1 tablet by mouth twice a day  . Multiple Vitamins-Minerals (CENTRUM  SILVER PO) Take 1 tablet by mouth daily.    . pregabalin (LYRICA) 50 MG capsule Take 1 capsule (50 mg total) by mouth at bedtime.  . [DISCONTINUED] glimepiride (AMARYL) 4 MG tablet take 1 tablet once daily every morning  . [DISCONTINUED] betamethasone dipropionate (DIPROLENE) 0.05 % ointment Apply topically as needed.  . [DISCONTINUED] meloxicam (MOBIC) 7.5 MG tablet take 1 tablet by mouth once daily if needed for ARTHRITIS PAIN    Allergies  Allergen Reactions  . Codeine     REACTION: nausea    Current Medications, Allergies, Past Medical History, Past Surgical History, Family History, and Social History were reviewed in [employer] record.   Review of Systems    See HPI - all other systems neg except as noted...  The patient denies anorexia, fever, weight loss, weight gain, vision loss, decreased hearing, hoarseness, chest pain, syncope, dyspnea on exertion, peripheral edema, prolonged cough, headaches, hemoptysis, abdominal pain, melena, hematochezia, severe indigestion/heartburn, hematuria, incontinence, muscle weakness, suspicious skin lesions, transient blindness, difficulty walking, depression, unusual weight change, abnormal bleeding, enlarged  lymph nodes, and angioedema.   Objective:   Physical Exam     WD, WN, 79 y/o BF in NAD... GENERAL:  Alert & oriented; pleasant & cooperative... HEENT:  [place]/AT, Ophthal per DrHecker, EACs-clear, TMs-wnl, NOSE-clear, THROAT-clear & wnl. NECK:  Supple w/ fairROM; no JVD; normal carotid impulses w/o bruits; no thyromegaly or nodules palpated; no lymphadenopathy. CHEST:  Clear to P & A; without wheezes/ rales/ or rhonchi heard... HEART:  Regular Rhythm; without murmurs/ rubs/ or gallops detected... ABDOMEN:  Soft & nontender; normal bowel sounds; no organomegaly or masses palpated, no guarding, or rebound. ... EXT: without deformities, mod arthritic changes; no varicose veins/ +venous insuffic/ no edema. some decr ROM  left hip... NEURO:  CN's intact; motor testing normal; sensory testing normal & monofilament test= norm; gait normal & balance OK. DERM:  No lesions noted; no rash etc...  RADIOLOGY DATA:  Reviewed in the EPIC EMR & discussed w/ the patient...  LABORATORY DATA:  Reviewed in the EPIC EMR & discussed w/ the patient...   Assessment & Plan:    HBP>  BP is well regulated on diet alone- she avoids sodium & has been working on wt reduction...  VI>  Stable, she knows to avoid sodium, elevate legs, wear support hose...  CHOL>  She refuses statin meds; fortunately her recent diet efforts have been rewarded w/ much better lipid numbers, keep up the good work...  DM>  On Metformin Bid & Glimep 4mg Qam, plus diet etc;  Labs w/ BS=116, A1c 7.4> rec to take Metform500Bid & the Glimep2mg  every day!  GI> HH, IBS, Polyps>  Stable, no changes... Refer to GI for f/u colon...  DJD, LBP>  Stable on NSAID analgesics...  Osteopenia>  She still refuses Bisphos rx; continue calcium, MVI, Vit D supplementation...  Anxiety>  Under stress w/ grand daughter... We refilled her Tranxene- she notes some tension/ mixed HAs & uses tylenol/ Goodies (her choice).   Patient's Medications  New Prescriptions   No medications on file  Previous Medications   ACCU-CHEK FASTCLIX LANCETS MISC    1 Units by Other route daily. Test blood sugar once daily.  Dx  250.00   ASPIRIN 81 MG TABLET    Take 81 mg by mouth daily.     BETAMETHASONE DIPROPIONATE (DIPROLENE) 0.05 % OINTMENT    apply to affected area twice a day   BLOOD GLUCOSE MONITORING SUPPL (ACCU-CHEK AVIVA PLUS) W/DEVICE KIT    1 Device by Does not apply route as directed.   CALCIUM-VITAMIN D (OSCAL WITH D) 500-200 MG-UNIT PER TABLET    Take 1 tablet by mouth 2 times per week   CLORAZEPATE (TRANXENE) 7.5 MG TABLET    take 1 tablet by mouth three times a day if needed   FISH OIL-OMEGA-3 FATTY ACIDS 1000 MG CAPSULE    Take 1 g by mouth daily.     GLUCOSE BLOOD  (ACCU-CHEK AVIVA PLUS) TEST STRIP    TEST UP TO once daily   MELOXICAM (MOBIC) 7.5 MG TABLET    take 1 tablet by mouth once daily if needed for ARTHRITIS PAIN   METFORMIN (GLUCOPHAGE) 500 MG TABLET    take 1 tablet by mouth twice a day   MULTIPLE VITAMINS-MINERALS (CENTRUM SILVER PO)    Take 1 tablet by mouth daily.     PREGABALIN (LYRICA) 50 MG CAPSULE    Take 1 capsule (50 mg total) by mouth at bedtime.  Modified Medications   Modified Medication Previous Medication   GLIMEPIRIDE (  AMARYL) 4 MG TABLET glimepiride (AMARYL) 4 MG tablet      take 1/2  tablet once daily every morning    take 1 tablet once daily every morning  Discontinued Medications   BETAMETHASONE DIPROPIONATE (DIPROLENE) 0.05 % OINTMENT    Apply topically as needed.   MELOXICAM (MOBIC) 7.5 MG TABLET    take 1 tablet by mouth once daily if needed for ARTHRITIS PAIN

## 2013-06-22 NOTE — Patient Instructions (Signed)
Today we updated your med list in our EPIC system...    Continue your current medications the same...  Today we checked your follow up DM labs...    We will contact you w/ the results when available...   Call for any questions...  Let's plan a follow up visit in 79mo with FASTING blood work at that time.Marland KitchenMarland Kitchen

## 2013-06-26 ENCOUNTER — Encounter: Payer: Medicare Other | Admitting: Internal Medicine

## 2013-07-28 ENCOUNTER — Telehealth: Payer: Self-pay | Admitting: Pulmonary Disease

## 2013-07-28 NOTE — Telephone Encounter (Signed)
Per TP: sounds viral.  Recommend mucinex twice daily as needed for cough/congestion, plenty of fluids, rest, saline nasal spray as needed.  Please call back if symptoms do not improve or worsen.  Thanks.

## 2013-07-28 NOTE — Telephone Encounter (Signed)
Last OV 06/22/13. Pt is c/o having nasal congestion, sneezing, sore throat, and dry cough x 2 days. Pt is taking alka-seltzer cold and sinus without relief. She has not tried any nasal saline which I advised she do so. Pt denies any SOB, chest congestion, HA, fever, body aches, chills, N/V. Pt is requesting an antibiotic. Please advise. [place] Bing, CMA Allergies  Allergen Reactions  . Codeine     REACTION: nausea

## 2013-07-28 NOTE — Telephone Encounter (Signed)
Pt is aware of TP's recs. Nothing further was needed.

## 2013-07-28 NOTE — Telephone Encounter (Signed)
Pt has to run out of the house. Please call back at [phone]

## 2013-07-30 ENCOUNTER — Telehealth: Payer: Self-pay | Admitting: Pulmonary Disease

## 2013-07-30 MED ORDER — AZITHROMYCIN 250 MG PO TABS: 250.0000 mg | ORAL_TABLET | ORAL | Status: DC

## 2013-07-30 MED ORDER — MAGIC MOUTHWASH: 5.0000 mL | Freq: Four times a day (QID) | ORAL | Status: DC

## 2013-07-30 NOTE — Telephone Encounter (Signed)
Pt checking on status.  Kimberly Aguirre

## 2013-07-30 NOTE — Telephone Encounter (Signed)
Spoke with pt--aware of recs per SN Per SN: MMW #4oz 1 tsp gargle and swallow QID Zpak  #1 (TAD)  Rx called into [facility]

## 2013-07-30 NOTE — Telephone Encounter (Signed)
Called and spoke with pt and she stated that she has a severe sore throat, throat feels very raw, cough with thick yellow sputum at times.  Pt stated that she is still taking the mucinex and following TP recs from phone call on 1/13 that was for fluids, rest, mucinex.  Pt stated that its hard to swallow when she is drinking and eating.  SN please advise. Thanks  Last ov--06/22/2013 Next ov--12/22/2013  Allergies  Allergen Reactions  . Codeine     REACTION: nausea     Current Outpatient Prescriptions on File Prior to Visit  Medication Sig Dispense Refill  . ACCU-CHEK FASTCLIX LANCETS MISC 1 Units by Other route daily. Test blood sugar once daily.  Dx  250.00  102 each  6  . aspirin 81 MG tablet Take 81 mg by mouth daily.        . betamethasone dipropionate (DIPROLENE) 0.05 % ointment apply to affected area twice a day  30 g  0  . Blood Glucose Monitoring Suppl (ACCU-CHEK AVIVA PLUS) W/DEVICE KIT 1 Device by Does not apply route as directed.  1 kit  0  . calcium-vitamin D (OSCAL WITH D) 500-200 MG-UNIT per tablet Take 1 tablet by mouth 2 times per week      . clorazepate (TRANXENE) 7.5 MG tablet take 1 tablet by mouth three times a day if needed  90 tablet  1  . fish oil-omega-3 fatty acids 1000 MG capsule Take 1 g by mouth daily.        [name] glimepiride (AMARYL) 4 MG tablet take 1/2  tablet once daily every morning      . glucose blood (ACCU-CHEK AVIVA PLUS) test strip TEST UP TO once daily  100 each  6  . meloxicam (MOBIC) 7.5 MG tablet take 1 tablet by mouth once daily if needed for ARTHRITIS PAIN  30 tablet  11  . metFORMIN (GLUCOPHAGE) 500 MG tablet take 1 tablet by mouth twice a day  60 tablet  11  . Multiple Vitamins-Minerals (CENTRUM SILVER PO) Take 1 tablet by mouth daily.        . pregabalin (LYRICA) 50 MG capsule Take 1 capsule (50 mg total) by mouth at bedtime.  30 capsule  1   No current facility-administered medications on file prior to visit.

## 2013-07-31 ENCOUNTER — Telehealth: Payer: Self-pay | Admitting: Pulmonary Disease

## 2013-07-31 NOTE — Telephone Encounter (Signed)
Called and spoke with pt and she is aware to cont the mucinex with the zpak.  Nothing further is needed.

## 2013-08-31 ENCOUNTER — Ambulatory Visit: Payer: Medicare Other | Admitting: Podiatry

## 2013-08-31 ENCOUNTER — Encounter: Payer: Self-pay | Admitting: Podiatry

## 2013-08-31 ENCOUNTER — Ambulatory Visit (INDEPENDENT_AMBULATORY_CARE_PROVIDER_SITE_OTHER): Payer: Medicare Other | Admitting: Podiatry

## 2013-08-31 VITALS — BP 134/79 | HR 87 | Resp 12

## 2013-08-31 DIAGNOSIS — M79609 Pain in unspecified limb: Secondary | ICD-10-CM

## 2013-08-31 DIAGNOSIS — B351 Tinea unguium: Secondary | ICD-10-CM | POA: Diagnosis not present

## 2013-08-31 NOTE — Progress Notes (Signed)
Patient ID: Kimberly Aguirre, female   DOB: 06-Dec-1933, 80 y.o.   MRN: [id number] Subjective: This 80 year old black female, diabetic presents for ongoing debridement of painful mycotic toenails an approximately three-month intervals.   Objective: Discolored, deformed toenails with texture and color changes and palpable tenderness in all nail plates x10.   Assessment: Symptomatic onychomycoses x10  Diabetes   Plan: All 10 toenails debrided back without any bleeding. Reappoint at three-month intervals.

## 2013-09-09 ENCOUNTER — Ambulatory Visit: Payer: Medicare Other | Admitting: Pulmonary Disease

## 2013-09-26 ENCOUNTER — Other Ambulatory Visit: Payer: Self-pay | Admitting: Pulmonary Disease

## 2013-09-28 ENCOUNTER — Telehealth: Payer: Self-pay | Admitting: Pulmonary Disease

## 2013-09-28 NOTE — Telephone Encounter (Signed)
Called spoke with pt. SN had opening thursday. She scheduled appt at that time. Nothing further needed

## 2013-09-29 ENCOUNTER — Telehealth: Payer: Self-pay | Admitting: Pulmonary Disease

## 2013-09-29 MED ORDER — CLORAZEPATE DIPOTASSIUM 7.5 MG PO TABS: 7.5000 mg | ORAL_TABLET | Freq: Three times a day (TID) | ORAL | Status: DC | PRN

## 2013-09-29 NOTE — Telephone Encounter (Signed)
Rx has been sent in. Pt is aware.  Advised her that as of April 1 SN will no longer be doing primary care. Has upcoming appointment with SN on 10/01/13. She wants to talk to Leigh/SN about who to see when she comes in for her appointment.

## 2013-09-30 NOTE — Telephone Encounter (Signed)
Rx was sent through phone msg on 09/29/13.

## 2013-10-01 ENCOUNTER — Encounter: Payer: Self-pay | Admitting: Pulmonary Disease

## 2013-10-01 ENCOUNTER — Ambulatory Visit (INDEPENDENT_AMBULATORY_CARE_PROVIDER_SITE_OTHER): Payer: Medicare Other | Admitting: Pulmonary Disease

## 2013-10-01 ENCOUNTER — Ambulatory Visit (INDEPENDENT_AMBULATORY_CARE_PROVIDER_SITE_OTHER)
Admission: RE | Admit: 2013-10-01 | Discharge: 2013-10-01 | Disposition: A | Discharge status: Home / Self Care | Payer: Medicare Other | Source: Ambulatory Visit | Attending: Pulmonary Disease | Admitting: Pulmonary Disease

## 2013-10-01 VITALS — BP 114/68 | HR 73 | Temp 97.0°F | Ht 61.0 in | Wt 129.4 lb

## 2013-10-01 DIAGNOSIS — R05 Cough: Secondary | ICD-10-CM

## 2013-10-01 DIAGNOSIS — K589 Irritable bowel syndrome without diarrhea: Secondary | ICD-10-CM

## 2013-10-01 DIAGNOSIS — M899 Disorder of bone, unspecified: Secondary | ICD-10-CM

## 2013-10-01 DIAGNOSIS — N318 Other neuromuscular dysfunction of bladder: Secondary | ICD-10-CM

## 2013-10-01 DIAGNOSIS — M1990 Unspecified osteoarthritis, unspecified site: Secondary | ICD-10-CM

## 2013-10-01 DIAGNOSIS — E7800 Pure hypercholesterolemia, unspecified: Secondary | ICD-10-CM | POA: Diagnosis not present

## 2013-10-01 DIAGNOSIS — R059 Cough, unspecified: Secondary | ICD-10-CM

## 2013-10-01 DIAGNOSIS — K449 Diaphragmatic hernia without obstruction or gangrene: Secondary | ICD-10-CM

## 2013-10-01 DIAGNOSIS — I10 Essential (primary) hypertension: Secondary | ICD-10-CM

## 2013-10-01 DIAGNOSIS — I872 Venous insufficiency (chronic) (peripheral): Secondary | ICD-10-CM

## 2013-10-01 DIAGNOSIS — M545 Low back pain, unspecified: Secondary | ICD-10-CM

## 2013-10-01 DIAGNOSIS — E119 Type 2 diabetes mellitus without complications: Secondary | ICD-10-CM

## 2013-10-01 DIAGNOSIS — D126 Benign neoplasm of colon, unspecified: Secondary | ICD-10-CM

## 2013-10-01 DIAGNOSIS — M949 Disorder of cartilage, unspecified: Secondary | ICD-10-CM

## 2013-10-01 DIAGNOSIS — F411 Generalized anxiety disorder: Secondary | ICD-10-CM

## 2013-10-01 MED ORDER — ACCU-CHEK FASTCLIX LANCETS MISC: 1.0000 [IU] | Freq: Every day | Status: DC

## 2013-10-01 MED ORDER — METHYLPREDNISOLONE (PAK) 4 MG PO TABS: ORAL_TABLET | ORAL | Status: DC

## 2013-10-01 MED ORDER — GLUCOSE BLOOD VI STRP: ORAL_STRIP | Status: DC

## 2013-10-01 NOTE — Progress Notes (Signed)
Subjective:    Patient ID: Kimberly Aguirre, female    DOB: 07/01/1934, 80 y.o.   MRN: [id number]  HPI 80 y/o BF here for a follow up visit...  he has multiple medical problems as noted below...   ~  February 19, 2012:  8moROV & Kimberly Aguirre has some diffuse aches & pains- eg left arm & left groin area; she says "my left side is my weak side", walking helps, about the same on Mobic, & she notes that "being around other people helps"...  She is also c/o some stress w/ granddaughter & she notes BS is up when she's stressed...  Weight is down 5# to 127# today & labs showed BS=153, A1c=7.1 on Metform+Amaryl...     We reviewed prob list, meds, xrays and labs> see below>> LABS 8/13:  FLP- ok on diet & FishOil;  Chems- ok x BS=153 A1c=7.1;  CBC- ok;  TSH=1.00;  VitD=42; UA= ok  ~  August 21, 2012:  663moOV & [name] notes some tension headaches that she thinks is realted to stress- she takes Tylenol or Goodies w/ relief, & she has Chlorazepate for nerves...     BP controlled on diet alone & measures 100/62 today; she denies CP, palpit, SOB, dizzy, edema; [facility]...    Lipids have been reasonable on diet + Fish Oil therapy & she prefers to avoid statin Rx...    DM control is acceptable on Metform500Bid & Glimep4/d; BS=84 (denies hypoglycemia) and A1c=7.1; she notes BS is up & down at home; Rec to continue same & monitor for any low sugatrs in case we have to lower the Glimep dose...    GI is stable w/o abd pain, n/v/d, but she notes occas constip- Rec Miralax, Senakot...    Bladder symptoms resolved, control improved...    DJD, LBP> she has heberdens nodes but not c/o much discomfort; back imoproved, prev tried Lyrica- now off... We reviewed prob list, meds, xrays and labs> see below for updates >> she had the 2013 Flu vaccine 10/13... LABS 2/14:  Chems- wnl w/ BS=84 A1c=7.1   ~  March 10, 2013:  64m42moV & Kimberly Aguirre tells me she is stressed out about her cardiac eval>  see below... We reviewed the following medical problems during today's office visit >>     BorderlineBP> controlled on diet alone; BP= 128/60 & she denies CP, palpit, dizzy, SOB, edema, etc...    Chol> on fish oil, and refused statin Rx prev; FLP 8/14 showed TChol 186, TG 103, HDL 47, LDL 119    DM> on Metform500Bid & Glimep4mg364m; BS=135, A1c=7.5 and she may need 3rd med => rec to add Onglyza5mg/33m.    GI- HH, IBS, Polyps> stable on OTC meds prn; last colonoscopy 2005 w/ 2 sm polyps & overdue for f/u procedure...    GU- OB> symptoms much improved after her surg 12/11 (TVH, ureteral sling) & prev meds stopped...    DJD, LBP, Osteopenia> on Mobic as needed; takes calcium, MVI, VitD; refuses bisphos Rx...    Anxiety> stress from family (grand-daughter) & on Tranxene7.5 prn... We reviewed prob list, meds, xrays and labs> see below for updates >>  ADDENDUM>> 9/2 we called pt w/ results: BS=135 A1c=7.5 and wanted to add Onglyza5 but she declined additional med stating that she wasn't taking the Glimep4 "since it dropped my sugar too bad"; she never mentioned this during the OV & after discussion it is rec that  she take the Metform500Bid & Glimep4-1/2 tab every AM w/ ROV recheck labs in 3-374mo..  ~  June 22, 2013:  3-4874moOV & recheck> when seen in Aug2014 her BS=135 but A1c=7.5 and we rec to add 3rd med (Onglyza) but then she admitted to NOT taking the Glimep4, so we compromised & added back the Glimep4m68m1/2 tab Qam (f/u labs pending today); she now tells me that she is only taking the Glim2mg88mout 5/7 days each week & doesn't like to take it if FBS<130 because she feels it is too low; she did not bring list of BS checks today...  BP is well controlled on diet alone & measures 110/64 today...  FLP was acceptable but LDL=119 [id number] stressed diet & wt reduction (since then she has gained 2#)...  Gi/ GU/ & Ortho are stable but she notes some left groin pain for which she has seen [facility] x2 in  the past, asked to f/u w/ them but she notes it's not that bad yet...  NOTE:  Epic says she has not had Pneumovax so we gave her the 23 valent vaccine today...  LABS 12/14:  Chems showed ok x BS= 116, A1c=7.4 om Metform500Bid & Glim4-1/2 daily (continue same DAILY + better diet)...  ~  October 01, 2013:  74mo 28mo& Kimberly Aguirre indicates that she had a URI ~25mo a374mo/ meds called in & improved, but now c/o persist cough esp in eve w/ some beige sput; she denies CP, SOB, fever etc and she denies any reflux symptoms;  Exam is essentially neg;  We decided to check CXR (NAD), and LABS (she forgot to go to the lab); then treat w/ Medrol dosepak, Mucinex, Fluids, Delsym... We reviewed the following medical problems during today's office visit >>     BorderlineBP> controlled on diet alone + ASA81; BP= 114/68 & she denies CP, palpit, dizzy, SOB, edema, etc...    Chol> on fish oil, and refused statin Rx prev; FLP 8/14 showed TChol 186, TG 103, HDL 47, LDL 119    DM> on Metform500Bid & Glimep4mg-1/41mlabs 12/14 showed BS=116, A1c=7.4 and we decided to continue same meds + diet...    GI- HH, IBS, Polyps> stable on OTC meds prn; last colonoscopy 2005 w/ 2 sm polyps & overdue for f/u procedure...    GU- OB> symptoms much improved after her surg 12/11 (TVH, ureteral sling) & prev meds stopped...    DJD, LBP, Osteopenia> on Mobic as needed; takes calcium, MVI, VitD; refuses bisphos Rx...    Anxiety> stress from family (grand-daughter) & on Tranxene7.5 prn... We reviewed prob list, meds, xrays and labs> see below for updates >>   CXR 3/15 showed norm heart size, clear lungs, NAD...     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 birth defect, she had eye surgery age 83, fol57wed by DrHecker with mild bilat diabetic retinopathy reported 2/11 (she plans f/u in 9mo).  59moNTIAL HYPERTENSION, BORDERLINE (ICD-401.9) - controlled on diet... ~  2/13:  BP= 114/72 & denies HA, fatigue, visual changes, CP,  palipit, dizziness, syncope, dyspnea, edema, etc... ~  CXR 2/13 showed heart at upper lim normal, min bronchitic changes, clear, NAD ~  8/13:  BP= 112/68 & she denies CP, palpit, SOB, edema, etc;  K=3.8, BUN=14, Creat=0.8 ~  2/14: BP controlled on diet alone & measures 100/62 today; she denies CP, palpit, SOB, dizzy, edema; she has [facility]  [facility].  ~  2DEcho 5/14 showed mild LVH, norm LVF w/ EF=55-60%, no regional wall motion abn, Gr1DD, mild MR...  ~  8/14 & 12/14: controlled on diet alone; BP= 128/60 or better & she denies CP, palpit, dizzy, SOB, edema, etc... ~  CXR 3/15 showed norm heart size, clear lungs, NAD;  She had URI w/ persist cough- we decided to treat w/ Dosepak, Mucinex, etc...  CHEST PAIN, ATYPICAL (ICD-786.59) - hx atypical CP 2010> sharp, right chest, noticed at night & not days, 2-4/10 severity, no assoc n/v/diaphoresis, resolves spont in a few minutes... baseline EKG= NSR, WNL.[name]. had NuclearStressTest Dec00 which was neg- no infarct or ischemia, EF= 77%... no known coronary dis, but mult risk factors= DM, Chol, +FamHx strokes... [facility]...  ~  EKG 5/14 showed NSR, rate81, poss LAE, otherw wnl, NAD...   VENOUS INSUFFICIENCY (ICD-459.81) - follows low sodium diet, elevates legs, support hose... no swelling.  HYPERCHOLESTEROLEMIA (ICD-272.0) - on FISH OIL daily, she stopped her prev Simvastatin 86m on her own... refuses statin therapy due to pain and swelling in her ankles, and she just wants Diet Rx alone... ~  [place] TChol 147, TG 102, HDL 35, LDL 92... ? on her simvastatin 236md. ~  FLP 8/09 showed TChol 163, TG 129, HDL 29, LDL 109... she prefers diet alone- discussed! ~  FLP 8/10 showed TChol 211, Tg 187, HDL 41, LDL 146...offered Prav40 w/ hope fewer side effects. ~  1/11: she reports trial Prav40 but stopped this due to "cramping in joints, esp ankles"... ~  [place] on diet alone showed TChol  189, TG 155, HDL 40, LDL 118... "I'm committed to diet & ex" ~  [place] showed TChol 220, TG 182, HDL 44, LDL 157... she confirms her refusal of statin rx. ~  FLP 8/12 showed TChol 179, TG 116, HDL 47, LDL 109... Much improved on diet, continue same! ~  [place]/13 showed TChol 177, TG 93, HDL 49, LDL 109 ~  FLP 8/14 showed TChol 186, TG 103, HDL 47, LDL 119   DIABETES MELLITUS (ICD-250.00) - on METFORMIN 50084md & AMARYL 4mg78m..  ~  labs 12/08 showed BS=101, HgA1c=7.3... rMarland KitchenMarland Kitchen to add Actos but pt didn't fill this Rx. ~  labs 8/09 showed BS= 146, HgA1c= 7.4... rMarland KitchenMarland Kitchen incr Metform 2Bid + Amaryl 4mg/87m.  ~  note- pt states intol to Metform 2Bid and had to decr back to 1Bid + the Glimep... ~  labs 2/10 showed BS= 53, A1c= 7.0... reMarland KitchenMarland Kitchensame meds, better diet, may need decr Glimep. ~  labs 8/10 showed BS= 127, A1c= 7.0... reMarland Kitchen> keep same, better diet. ~  1/11: she reports BS at home all in the 100-140+ range... ~  labs 8/11 on Metfor500Bid+Glim4 showed BS= 151, A1c= 7.2 ~  labs 2/12 showed BS= 158, A1c= 7.2... coMarland KitchenMarland Kitcheninue same + better diet. ~  Labs 8/12 showed BS= 129, A1c= 7.0... ImMarland KitchenMarland Kitchenoved> same meds + diet! ~  Labs 2/13 on Metform500Bid+Glimep4 showed BS= 196, A1c= 7.2... Next step add 3rd med. ~  Labs 8/13 on Metform500Bid+Glimep4 showed BS= 153, A1c= 7.1 ~  Labs 2/14 on Metform500Bid+Glim4 showed BS= 84, A1c= 7.1 ~  Ophthalmology check 6/14 by DrHecker- mild background DM retinopathy & Glaucoma suspect... ~  Labs 8/14 on Metform500Bid+Glim4 (not!) showed BS= 135, A1c= 7.5... reMarland KitchenMarland Kitchento add ONGLYZA5, then she admitted to not taking the Glim4 due to low sugars, so take Glim4- 1/2  tab daily! ~  Labs 12/14 on Metform500Bid+Glim2 (taking5/7d) showed BS= 116, A1c= 7.4 and take it daily!  HIATAL HERNIA (ICD-553.3) - last EGD was 1980 showing 2cmHH, mild esophagitis and gastritis...  IRRITABLE BOWEL SYNDROME (ICD-564.1) - she uses Miralax, Senakot as needed...  COLONIC POLYPS (ICD-211.3) - last colonoscopy 7/05  by DrBrodie showed 2-77m polyps... f/u planned 595yr ~  8/12: had pain in left groin area on & off w/ eval from GYN & given Ibuprofen which helped... ~  8/14: she has not had f/u colon & we will refer to DrDBrodie for recheck...  OVERACTIVE BLADDER (ICD-596.51) - she notes symptoms c/w overactive bladder 8/11 & we discussed trial of Toviaz... symptoms much improved after [facility] and Toviaz stopped...  DEGENERATIVE JOINT DISEASE (ICD-715.90) - right hip DJD per DrGioffre w/ shot in 2006... repeat eval Spring2011 and nothing found according to the pt "I'm his mystery pt" she says... takes MOBIC 7.25m68mrn w/ some relief...  BACK PAIN, LUMBAR (ICD-724.2) - she prev had some leg pain at night, ?neuropathy and tried Lyrica... ~  1/11:  denies back pain etc...  OSTEOPENIA (ICD-733.90) - on OSCAL Bid + vits daily, she stopped Fosamax on her own & states intol to bisphosphonates...  ~  BMD 5/08 showed TScores -1.1 to -1.5... [name] VitD level=64 in 8/08... rec to start Fosamax- pt refused. ~  BMD 9/11 showed TScores -2.0 in Spine, and -1.9 in left FemNeck... pt refuses bisphos rx. ~  she has osteopenia followed by GYN but pt refuses bisphos therapy- on calcium, vits.  ~  Labs 8/14 showed Vit D level = 52  HEADACHE (ICD-784.0)  ANXIETY (ICD-300.00) - she uses CHLORAZEPATE 7.25mg225mn; under stress w/ grand daughter; med refilled...  SCREENING STUDIES & HEALTH MAINT:  ~  NuclearStressTest 12/00 was neg for infarct or ischemia, EF=77%... ~  Pneumovax shot 2003 at age 34; 81 gave her a second vaccination 12/14 per epic notification... ~  Tetanus shot 2004 w/ local inflamm reaction. ~  GYN= DrDickstein et al...   Past Surgical History  Procedure Laterality Date  . Eye surgery      age 82  81Vaginal hysterectomy      and ureteral sling 06/2010 by Dr. DickCristine PolioOutpatient Encounter Prescriptions as of 10/01/2013  Medication Sig  . aspirin 81 MG  tablet Take 81 mg by mouth daily.    . betamethasone dipropionate (DIPROLENE) 0.05 % ointment apply to affected area twice a day  . Blood Glucose Monitoring Suppl (ACCU-CHEK AVIVA PLUS) W/DEVICE KIT 1 Device by Does not apply route as directed.  . calcium-vitamin D (OSCAL WITH D) 500-200 MG-UNIT per tablet Take 1 tablet by mouth 2 times per week  . clorazepate (TRANXENE) 7.5 MG tablet Take 1 tablet (7.5 mg total) by mouth 3 (three) times daily as needed for anxiety.  . fish oil-omega-3 fatty acids 1000 MG capsule Take 1 g by mouth daily.    . glMarland Kitchenmepiride (AMARYL) 4 MG tablet take 1/2  tablet once daily every morning  . glucose blood (ACCU-CHEK AVIVA PLUS) test strip TEST UP TO once daily  . meloxicam (MOBIC) 7.5 MG tablet take 1 tablet by mouth once daily if needed for ARTHRITIS PAIN  . metFORMIN (GLUCOPHAGE) 500 MG tablet take 1 tablet by mouth twice a day  . Multiple Vitamins-Minerals (CENTRUM SILVER PO) Take 1 tablet by mouth daily.    . ACMarland KitchenU-CHEK FASTCLIX LANCETS MISC 1 Units  by Other route daily. Test blood sugar once daily.  Dx  250.00  . [DISCONTINUED] Alum & Mag Hydroxide-Simeth (MAGIC MOUTHWASH) SOLN Take 5 mLs by mouth 4 (four) times daily. Gargle and swallow.  . [DISCONTINUED] azithromycin (ZITHROMAX) 250 MG tablet Take 1 tablet (250 mg total) by mouth as directed.  . [DISCONTINUED] pregabalin (LYRICA) 50 MG capsule Take 1 capsule (50 mg total) by mouth at bedtime.    Allergies  Allergen Reactions  . Codeine     REACTION: nausea    Current Medications, Allergies, Past Medical History, Past Surgical History, Family History, and Social History were reviewed in [employer] record.   Review of Systems    See HPI - all other systems neg except as noted...  The patient denies anorexia, fever, weight loss, weight gain, vision loss, decreased hearing, hoarseness, chest pain, syncope, dyspnea on exertion, peripheral edema, prolonged cough, headaches, hemoptysis,  abdominal pain, melena, hematochezia, severe indigestion/heartburn, hematuria, incontinence, muscle weakness, suspicious skin lesions, transient blindness, difficulty walking, depression, unusual weight change, abnormal bleeding, enlarged lymph nodes, and angioedema.   Objective:   Physical Exam     WD, WN, 80 y/o BF in NAD... GENERAL:  Alert & oriented; pleasant & cooperative... HEENT:  [place]/AT, Ophthal per DrHecker, EACs-clear, TMs-wnl, NOSE-clear, THROAT-clear & wnl. NECK:  Supple w/ fairROM; no JVD; normal carotid impulses w/o bruits; no thyromegaly or nodules palpated; no lymphadenopathy. CHEST:  Clear to P & A; without wheezes/ rales/ or rhonchi heard... HEART:  Regular Rhythm; without murmurs/ rubs/ or gallops detected... ABDOMEN:  Soft & nontender; normal bowel sounds; no organomegaly or masses palpated, no guarding, or rebound. ... EXT: without deformities, mod arthritic changes; no varicose veins/ +venous insuffic/ no edema. some decr ROM left hip... NEURO:  CN's intact; motor testing normal; sensory testing normal & monofilament test= norm; gait normal & balance OK. DERM:  No lesions noted; no rash etc...  RADIOLOGY DATA:  Reviewed in the EPIC EMR & discussed w/ the patient...  LABORATORY DATA:  Reviewed in the EPIC EMR & discussed w/ the patient...   Assessment & Plan:    HBP>  BP is well regulated on diet alone- she avoids sodium & has been working on wt reduction...  VI>  Stable, she knows to avoid sodium, elevate legs, wear support hose...  CHOL>  She refuses statin meds; fortunately her recent diet efforts have been rewarded w/ much better lipid numbers, keep up the good work...  DM>  On Metformin Bid & Glimep 30mQam, plus diet etc;  Labs w/ BS=116, A1c 7.4> rec to take Metform500Bid & the Glimep238mevery day!  GI> HH, IBS, Polyps>  Stable, no changes... Refer to GI for f/u colon...  DJD, LBP>  Stable on NSAID analgesics...  Osteopenia>  She still refuses Bisphos  rx; continue calcium, MVI, Vit D supplementation...  Anxiety>  Under stress w/ grand daughter... We refilled her Tranxene- she notes some tension/ mixed HAs & uses tylenol/ Goodies (her choice).   Patient's Medications  New Prescriptions   DIPHENHYD-HYDROCORT-NYSTATIN (FIRST-DUKES MOUTHWASH) SUSP    1 tsp gargle and swallow four times daily as needed  Previous Medications   ASPIRIN 81 MG TABLET    Take 81 mg by mouth daily.     BETAMETHASONE DIPROPIONATE (DIPROLENE) 0.05 % OINTMENT    apply to affected area twice a day   BLOOD GLUCOSE MONITORING SUPPL (ACCU-CHEK AVIVA PLUS) W/DEVICE KIT    1 Device by Does not apply route as directed.  CALCIUM-VITAMIN D (OSCAL WITH D) 500-200 MG-UNIT PER TABLET    Take 1 tablet by mouth 2 times per week   CLORAZEPATE (TRANXENE) 7.5 MG TABLET    Take 1 tablet (7.5 mg total) by mouth 3 (three) times daily as needed for anxiety.   FISH OIL-OMEGA-3 FATTY ACIDS 1000 MG CAPSULE    Take 1 g by mouth daily.     GLIMEPIRIDE (AMARYL) 4 MG TABLET    take 1/2  tablet once daily every morning   MELOXICAM (MOBIC) 7.5 MG TABLET    take 1 tablet by mouth once daily if needed for ARTHRITIS PAIN   METFORMIN (GLUCOPHAGE) 500 MG TABLET    take 1 tablet by mouth twice a day   MULTIPLE VITAMINS-MINERALS (CENTRUM SILVER PO)    Take 1 tablet by mouth daily.    Modified Medications   Modified Medication Previous Medication   ACCU-CHEK FASTCLIX LANCETS MISC ACCU-CHEK FASTCLIX LANCETS MISC      1 Units by Other route daily. Test blood sugar once daily.  Dx  250.00    1 Units by Other route daily. Test blood sugar once daily.  Dx  250.00   GLUCOSE BLOOD (ACCU-CHEK AVIVA PLUS) TEST STRIP glucose blood (ACCU-CHEK AVIVA PLUS) test strip      TEST UP TO once daily    TEST UP TO once daily  Discontinued Medications   ALUM & MAG HYDROXIDE-SIMETH (MAGIC MOUTHWASH) SOLN    Take 5 mLs by mouth 4 (four) times daily. Gargle and swallow.   AZITHROMYCIN (ZITHROMAX) 250 MG TABLET    Take 1 tablet  (250 mg total) by mouth as directed.   METHYLPREDNISOLONE (MEDROL DOSPACK) 4 MG TABLET    follow package directions   PREGABALIN (LYRICA) 50 MG CAPSULE    Take 1 capsule (50 mg total) by mouth at bedtime.

## 2013-10-01 NOTE — Patient Instructions (Signed)
Today we updated your med list in our EPIC system...    Continue your current medications the same...  Today we did your follow up blood work & CXR for further evaluation of your cough...    We will contact you w/ the results when available...   Start taking the OTC MUCINEX 600mg  tabs- 2 tabs twice daily & lots of water...    Also use the OTC cough syruop= DELSYM 2 tsp twice daily...  We wrote a new prescription for a Medrol dosepak for the bronchial tube inflammation...    Take as directed on the pack...  Call for any questions or if we can be of service in any way.Marland KitchenMarland Kitchen

## 2013-10-08 ENCOUNTER — Telehealth: Payer: Self-pay | Admitting: Pulmonary Disease

## 2013-10-08 MED ORDER — ACCU-CHEK FASTCLIX LANCETS MISC: 1.0000 [IU] | Freq: Every day | Status: DC

## 2013-10-08 MED ORDER — GLUCOSE BLOOD VI STRP: ORAL_STRIP | Status: DC

## 2013-10-08 NOTE — Telephone Encounter (Signed)
Notes Recorded by Noralee Space, MD on 10/02/2013 at 9:03 AM Please notify patient>  Normal heart size, clear lungs, NAD...  Spoke with pt and notified of results per Dr. Lenna Gilford. Pt verbalized understanding and denied any questions.  I have printed rxs for lancets and strips and these are up front for p/u per pt request  Nothing further needed

## 2013-10-08 NOTE — Progress Notes (Signed)
Quick Note:  Pt aware of these results  See 10/08/13 PN ______

## 2013-10-09 ENCOUNTER — Telehealth: Payer: Self-pay | Admitting: Pulmonary Disease

## 2013-10-09 NOTE — Telephone Encounter (Signed)
Error. Wrong patient.

## 2013-11-17 ENCOUNTER — Encounter: Payer: Self-pay | Admitting: Pulmonary Disease

## 2013-11-17 ENCOUNTER — Ambulatory Visit (INDEPENDENT_AMBULATORY_CARE_PROVIDER_SITE_OTHER): Payer: Medicare Other | Admitting: Pulmonary Disease

## 2013-11-17 VITALS — BP 120/74 | HR 79 | Temp 98.3°F | Ht 61.0 in | Wt 125.4 lb

## 2013-11-17 DIAGNOSIS — M545 Low back pain, unspecified: Secondary | ICD-10-CM

## 2013-11-17 DIAGNOSIS — E119 Type 2 diabetes mellitus without complications: Secondary | ICD-10-CM | POA: Diagnosis not present

## 2013-11-17 DIAGNOSIS — K449 Diaphragmatic hernia without obstruction or gangrene: Secondary | ICD-10-CM

## 2013-11-17 DIAGNOSIS — I872 Venous insufficiency (chronic) (peripheral): Secondary | ICD-10-CM

## 2013-11-17 DIAGNOSIS — K589 Irritable bowel syndrome without diarrhea: Secondary | ICD-10-CM

## 2013-11-17 DIAGNOSIS — I10 Essential (primary) hypertension: Secondary | ICD-10-CM | POA: Diagnosis not present

## 2013-11-17 DIAGNOSIS — M949 Disorder of cartilage, unspecified: Secondary | ICD-10-CM

## 2013-11-17 DIAGNOSIS — M899 Disorder of bone, unspecified: Secondary | ICD-10-CM

## 2013-11-17 DIAGNOSIS — E7800 Pure hypercholesterolemia, unspecified: Secondary | ICD-10-CM | POA: Diagnosis not present

## 2013-11-17 DIAGNOSIS — M1990 Unspecified osteoarthritis, unspecified site: Secondary | ICD-10-CM

## 2013-11-17 DIAGNOSIS — N318 Other neuromuscular dysfunction of bladder: Secondary | ICD-10-CM

## 2013-11-17 DIAGNOSIS — F411 Generalized anxiety disorder: Secondary | ICD-10-CM

## 2013-11-17 DIAGNOSIS — D126 Benign neoplasm of colon, unspecified: Secondary | ICD-10-CM

## 2013-11-17 MED ORDER — FIRST-DUKES MOUTHWASH MT SUSP: OROMUCOSAL | Status: DC

## 2013-11-17 NOTE — Progress Notes (Deleted)
Subjective:     Patient ID: Kimberly Aguirre, female   DOB: Oct 15, 1933, 80 y.o.   MRN: [id number]  HPI   Review of Systems     Objective:   Physical Exam     Assessment:     ***    Plan:     ***

## 2013-11-17 NOTE — Patient Instructions (Signed)
Today we updated your med list in our EPIC system...    Continue your current medications the same...    We refilled the Magic Mouthwash to use as needed....  Call for any questions or if we can be of service in any way.Marland KitchenMarland Kitchen

## 2013-11-20 NOTE — Progress Notes (Signed)
Subjective:    Patient ID: Kimberly Aguirre, female    DOB: 1934-05-16, 80 y.o.   MRN: [id number]  HPI 80 y/o BF here for a follow up visit...  he has multiple medical problems as noted below...   ~  February 19, 2012:  520moROV & Kimberly Aguirre has some diffuse aches & pains- eg left arm & left groin area; she says "my left side is my weak side", walking helps, about the same on Mobic, & she notes that "being around other people helps"...  She is also c/o some stress w/ granddaughter & she notes BS is up when she's stressed...  Weight is down 5# to 127# today & labs showed BS=153, A1c=7.1 on Metform+Amaryl...     We reviewed prob list, meds, xrays and labs> see below>>  LABS 8/13:  FLP- ok on diet & FishOil;  Chems- ok x BS=153 A1c=7.1;  CBC- ok;  TSH=1.00;  VitD=42; UA= ok  ~  August 21, 2012:  617moOV & Kimberly Aguirre notes some tension headaches that she thinks is realted to stress- she takes Tylenol or Goodies w/ relief, & she has Chlorazepate for nerves...     BP controlled on diet alone & measures 100/62 today; she denies CP, palpit, SOB, dizzy, edema; [facility]...    Lipids have been reasonable on diet + Fish Oil therapy & she prefers to avoid statin Rx...    DM control is acceptable on Metform500Bid & Glimep4/d; BS=84 (denies hypoglycemia) and A1c=7.1; she notes BS is up & down at home; Rec to continue same & monitor for any low sugatrs in case we have to lower the Glimep dose...    GI is stable w/o abd pain, n/v/d, but she notes occas constip- Rec Miralax, Senakot...    Bladder symptoms resolved, control improved...    DJD, LBP> she has heberdens nodes but not c/o much discomfort; back imoproved, prev tried Lyrica- now off... We reviewed prob list, meds, xrays and labs> see below for updates >> she had the 2013 Flu vaccine 10/13...  LABS 2/14:  Chems- wnl w/ BS=84 A1c=7.1   ~  March 10, 2013:  20m30moV & Kimberly Aguirre tells me she is stressed out about her cardiac  eval> see below... We reviewed the following medical problems during today's office visit >>     BorderlineBP> controlled on diet alone; BP= 128/60 & she denies CP, palpit, dizzy, SOB, edema, etc...    Chol> on fish oil, and refused statin Rx prev; FLP 8/14 showed TChol 186, TG 103, HDL 47, LDL 119    DM> on Metform500Bid & Glimep4mg120m; BS=135, A1c=7.5 and she may need 3rd med => rec to add Onglyza5mg/54m.    GI- HH, IBS, Polyps> stable on OTC meds prn; last colonoscopy 2005 w/ 2 sm polyps & overdue for f/u procedure...    GU- OB> symptoms much improved after her surg 12/11 (TVH, ureteral sling) & prev meds stopped...    DJD, LBP, Osteopenia> on Mobic as needed; takes calcium, MVI, VitD; refuses bisphos Rx...    Anxiety> stress from family (grand-daughter) & on Tranxene7.5 prn... We reviewed prob list, meds, xrays and labs> see below for updates >>   ADDENDUM>> 9/2 we called pt w/ results: BS=135 A1c=7.5 and wanted to add Onglyza5 but she declined additional med stating that she wasn't taking the Glimep4 "since it dropped my sugar too bad"; she never mentioned this during the OV & after discussion it  is rec that she take the Metform500Bid & Glimep4-1/2 tab every AM w/ ROV recheck labs in 3-66149mo..  ~  June 22, 2013:  3-469moOV & recheck> when seen in Aug2014 her BS=135 but A1c=7.5 and we rec to add 3rd med (Onglyza) but then she admitted to NOT taking the Glimep4, so we compromised & added back the Glimep4m67m1/2 tab Qam (f/u labs pending today); she now tells me that she is only taking the Glim2mg21mout 5/7 days each week & doesn't like to take it if FBS<130 because she feels it is too low; she did not bring list of BS checks today...  BP is well controlled on diet alone & measures 110/64 today...  FLP was acceptable but LDL=119 [id number] stressed diet & wt reduction (since then she has gained 2#)...  Gi/ GU/ & Ortho are stable but she notes some left groin pain for which she has seen [facility]  x2 in the past, asked to f/u w/ them but she notes it's not that bad yet...  She had the 2014 flu vaccine in Oct... NOTE: Epic says she has not had Pneumovax so we gave her the 23 valent vaccine today...   LABS 12/14:  Chems showed ok x BS= 116, A1c=7.4 om Metform500Bid & Glim4-1/2 daily (continue same DAILY + better diet)...  ~  October 01, 2013:  6149mo 29mo& [name] indicates that she had a URI ~149mo a76149mo/ meds called in & improved, but now c/o persist cough esp in eve w/ some beige sput; she denies CP, SOB, fever etc and she denies any reflux symptoms;  Exam is essentially neg;  We decided to check CXR (NAD), and LABS (she forgot to go to the lab); then treat w/ Medrol dosepak, Mucinex, Fluids, Delsym... We reviewed the following medical problems during today's office visit >>     BorderlineBP> controlled on diet alone + ASA81; BP= 114/68 & she denies CP, palpit, dizzy, SOB, edema, etc...    Chol> on fish oil, and refused statin Rx prev; FLP 8/14 showed TChol 186, TG 103, HDL 47, LDL 119    DM> on Metform500Bid & Glimep4mg-1/41mlabs 12/14 showed BS=116, A1c=7.4 and we decided to continue same meds + diet...    GI- HH, IBS, Polyps> stable on OTC meds prn; last colonoscopy 2005 w/ 2 sm polyps & overdue for f/u procedure...    GU- OB> symptoms much improved after her surg 12/11 (TVH, ureteral sling) & prev meds stopped...    DJD, LBP, Osteopenia> on Mobic as needed; takes calcium, MVI, VitD; refuses bisphos Rx...    Anxiety> stress from family (grand-daughter) & on Tranxene7.5 prn... We reviewed prob list, meds, xrays and labs> see below for updates >>   CXR 3/15 showed norm heart size, clear lungs, NAD...  ~  Nov 17, 2013:  149mo ROV27moecheck> Kimberly Aguirre's prev cough etc resolved after the Medrol dosepak, Mucinex, etc; she has noticed some sl hoarseness in the eve & improved w/ MMW prn; otherw she is reasonably stable>>    BP is fine on diet alone & measures 120/74 today; she denies CP, palpit, SOB, edema...     Lipids are fair on diet & FishOil; not fasting today- last FLP 8/14 had LDL=119 & we reviewed diet + exercise...    DM is controlled on Metform500Bid + glimep4mg-1/2 39m Qam; we discussed my retirement from primary care & [facility]...    She uses Mobic7.5 as needed & takes Calcium,  MVI in addition...     She has Tranxene 7.5 to use prn anxiety...              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 birth defect, she had eye surgery age 40, followed by DrHecker with mild bilat diabetic retinopathy reported 2/11 (she plans f/u in 51mo.  ESSENTIAL HYPERTENSION, BORDERLINE (ICD-401.9) - controlled on diet... ~  2/13:  BP= 114/72 & denies HA, fatigue, visual changes, CP, palipit, dizziness, syncope, dyspnea, edema, etc... ~  CXR 2/13 showed heart at upper lim normal, min bronchitic changes, clear, NAD ~  8/13:  BP= 112/68 & she denies CP, palpit, SOB, edema, etc;  K=3.8, BUN=14, Creat=0.8 ~  2/14: BP controlled on diet alone & measures 100/62 today; she denies CP, palpit, SOB, dizzy, edema; she has [facility] 3[facility].  ~  2DEcho 5/14 showed mild LVH, norm LVF w/ EF=55-60%, no regional wall motion abn, Gr1DD, mild MR...  ~  8/14 & 12/14: controlled on diet alone; BP= 128/60 or better & she denies CP, palpit, dizzy, SOB, edema, etc... ~  CXR 3/15 showed norm heart size, clear lungs, NAD;  She had URI w/ persist cough- we decided to treat w/ Dosepak, Mucinex, etc...  CHEST PAIN, ATYPICAL (ICD-786.59) - hx atypical CP 2010> sharp, right chest, noticed at night & not days, 2-4/10 severity, no assoc n/v/diaphoresis, resolves spont in a few minutes... baseline EKG= NSR, WNL..[name] had NuclearStressTest Dec00 which was neg- no infarct or ischemia, EF= 77%... no known coronary dis, but mult risk factors= DM, Chol, +FamHx strokes... [facility] & [facility]...  ~  EKG 5/14 showed NSR, rate81, poss LAE, otherw wnl, NAD...   VENOUS INSUFFICIENCY  (ICD-459.81) - follows low sodium diet, elevates legs, support hose... no swelling.  HYPERCHOLESTEROLEMIA (ICD-272.0) - on FISH OIL daily, she stopped her prev Simvastatin 239mon her own... refuses statin therapy due to pain and swelling in her ankles, and she just wants Diet Rx alone... ~  [place] showed TChol 147, TG 102, HDL 35, LDL 92... ? on her simvastatin 2030m. ~  FLP 8/09 showed TChol 163, TG 129, HDL 29, LDL 109... she prefers diet alone- discussed! ~  FLP 8/10 showed TChol 211, Tg 187, HDL 41, LDL 146...offered Prav40 w/ hope fewer side effects. ~  1/11: she reports trial Prav40 but stopped this due to "cramping in joints, esp ankles"... ~  FLPL[place]11 on diet alone showed TChol 189, TG 155, HDL 40, LDL 118... "I'm committed to diet & ex" ~  FL[place]12 showed TChol 220, TG 182, HDL 44, LDL 157... she confirms her refusal of statin rx. ~  FLP 8/12 showed TChol 179, TG 116, HDL 47, LDL 109... Much improved on diet, continue same! ~  FLPM[place]13 showed TChol 177, TG 93, HDL 49, LDL 109 ~  FLP 8/14 showed TChol 186, TG 103, HDL 47, LDL 119   DIABETES MELLITUS (ICD-250.00) - on METFORMIN 500m54m & AMARYL 4mg/30m.  ~  labs 12/08 showed BS=101, HgA1c=7.3... reMarland KitchenMarland Kitchento add Actos but pt didn't fill this Rx. ~  labs 8/09 showed BS= 146, HgA1c= 7.4... reMarland KitchenMarland Kitchenincr Metform 2Bid + Amaryl 4mg/d106m  ~  note- pt states intol to Metform 2Bid and had to decr back to 1Bid + the Glimep... ~  labs 2/10 showed BS= 53, A1c= 7.0... recMarland KitchenMarland Kitchename meds, better diet, may need decr Glimep. ~  labs 8/10 showed  BS= 127, A1c= 7.0.[name]. rec> keep same, better diet. ~  1/11: she reports BS at home all in the 100-140+ range... ~  labs 8/11 on Metfor500Bid+Glim4 showed BS= 151, A1c= 7.2 ~  labs 2/12 showed BS= 158, A1c= 7.2.Marland KitchenMarland Kitchen continue same + better diet. ~  Labs 8/12 showed BS= 129, A1c= 7.0.Marland KitchenMarland Kitchen Improved> same meds + diet! ~  Labs 2/13 on Metform500Bid+Glimep4 showed BS= 196, A1c= 7.2... Next step add 3rd med. ~  Labs 8/13 on  Metform500Bid+Glimep4 showed BS= 153, A1c= 7.1 ~  Labs 2/14 on Metform500Bid+Glim4 showed BS= 84, A1c= 7.1 ~  Ophthalmology check 6/14 by DrHecker- mild background DM retinopathy & Glaucoma suspect... ~  Labs 8/14 on Metform500Bid+Glim4 (not!) showed BS= 135, A1c= 7.5.Marland KitchenMarland Kitchen rec to add ONGLYZA5, then she admitted to not taking the Glim4 due to low sugars, so take Glim4- 1/2 tab daily! ~  Labs 12/14 on Metform500Bid+Glim2 (taking5/7d) showed BS= 116, A1c= 7.4 and take it daily!  HIATAL HERNIA (ICD-553.3) - last EGD was 1980 showing 2cmHH, mild esophagitis and gastritis...  IRRITABLE BOWEL SYNDROME (ICD-564.1) - she uses Miralax, Senakot as needed...  COLONIC POLYPS (ICD-211.3) - last colonoscopy 7/05 by DrBrodie showed 2-68m polyps... f/u planned 555yr ~  8/12: had pain in left groin area on & off w/ eval from GYN & given Ibuprofen which helped... ~  8/14: she has not had f/u colon & we will refer to DrDBrodie for recheck...  OVERACTIVE BLADDER (ICD-596.51) - she notes symptoms c/w overactive bladder 8/11 & we discussed trial of Toviaz... symptoms much improved after surg 12/11 [facility] drDickstein [facility] and Toviaz stopped...  DEGENERATIVE JOINT DISEASE (ICD-715.90) - right hip DJD per DrGioffre w/ shot in 2006... repeat eval Spring2011 and nothing found according to the pt "I'm his mystery pt" she says... takes MOBIC 7.41m66mrn w/ some relief...  BACK PAIN, LUMBAR (ICD-724.2) - she prev had some leg pain at night, ?neuropathy and tried Lyrica... ~  1/11:  denies back pain etc...  OSTEOPENIA (ICD-733.90) - on OSCAL Bid + vits daily, she stopped Fosamax on her own & states intol to bisphosphonates...  ~  BMD 5/08 showed TScores -1.1 to -1.5... [name] VitD level=64 in 8/08... rec to start Fosamax- pt refused. ~  BMD 9/11 showed TScores -2.0 in Spine, and -1.9 in left FemNeck... pt refuses bisphos rx. ~  she has osteopenia followed by GYN but pt refuses bisphos therapy- on calcium, vits.   ~  Labs 8/14 showed Vit D level = 52  HEADACHE (ICD-784.0)  ANXIETY (ICD-300.00) - she uses CHLORAZEPATE 7.41mg63mn; under stress w/ grand daughter; med refilled...  SCREENING STUDIES & HEALTH MAINT:  ~  NuclearStressTest 12/00 was neg for infarct or ischemia, EF=77%... ~  Pneumovax shot 2003 at age 85; 48 gave her a second vaccination 12/14 per epic notification... ~  Tetanus shot 2004 w/ local inflamm reaction. ~  GYN= DrDickstein et al...   Past Surgical History  Procedure Laterality Date  . Eye surgery      age 47  65Vaginal hysterectomy      and ureteral sling 06/2010 by Dr. DickCristine PolioOutpatient Encounter Prescriptions as of 11/17/2013  Medication Sig  . ACCU-CHEK FASTCLIX LANCETS MISC 1 Units by Other route daily. Test blood sugar once daily.  Dx  250.00  . aspirin 81 MG tablet Take 81 mg by mouth daily.    . betamethasone dipropionate (DIPROLENE) 0.05 % ointment apply to affected area twice a day  .  Blood Glucose Monitoring Suppl (ACCU-CHEK AVIVA PLUS) W/DEVICE KIT 1 Device by Does not apply route as directed.  . calcium-vitamin D (OSCAL WITH D) 500-200 MG-UNIT per tablet Take 1 tablet by mouth 2 times per week  . clorazepate (TRANXENE) 7.5 MG tablet Take 1 tablet (7.5 mg total) by mouth 3 (three) times daily as needed for anxiety.  . fish oil-omega-3 fatty acids 1000 MG capsule Take 1 g by mouth daily.    [name] glimepiride (AMARYL) 4 MG tablet take 1/2  tablet once daily every morning  . glucose blood (ACCU-CHEK AVIVA PLUS) test strip TEST UP TO once daily  . metFORMIN (GLUCOPHAGE) 500 MG tablet take 1 tablet by mouth twice a day  . Multiple Vitamins-Minerals (CENTRUM SILVER PO) Take 1 tablet by mouth daily.    . Diphenhyd-Hydrocort-Nystatin (FIRST-DUKES MOUTHWASH) SUSP 1 tsp gargle and swallow four times daily as needed  . meloxicam (MOBIC) 7.5 MG tablet take 1 tablet by mouth once daily if needed for ARTHRITIS PAIN  . [DISCONTINUED] methylPREDNIsolone (MEDROL DOSPACK)  4 MG tablet follow package directions    Allergies  Allergen Reactions  . Codeine     REACTION: nausea    Current Medications, Allergies, Past Medical History, Past Surgical History, Family History, and Social History were reviewed in [employer] record.   Review of Systems    See HPI - all other systems neg except as noted...  The patient denies anorexia, fever, weight loss, weight gain, vision loss, decreased hearing, hoarseness, chest pain, syncope, dyspnea on exertion, peripheral edema, prolonged cough, headaches, hemoptysis, abdominal pain, melena, hematochezia, severe indigestion/heartburn, hematuria, incontinence, muscle weakness, suspicious skin lesions, transient blindness, difficulty walking, depression, unusual weight change, abnormal bleeding, enlarged lymph nodes, and angioedema.   Objective:   Physical Exam     WD, WN, 80 y/o BF in NAD... GENERAL:  Alert & oriented; pleasant & cooperative... HEENT:  [place]/AT, Ophthal per DrHecker, EACs-clear, TMs-wnl, NOSE-clear, THROAT-clear & wnl. NECK:  Supple w/ fairROM; no JVD; normal carotid impulses w/o bruits; no thyromegaly or nodules palpated; no lymphadenopathy. CHEST:  Clear to P & A; without wheezes/ rales/ or rhonchi heard... HEART:  Regular Rhythm; without murmurs/ rubs/ or gallops detected... ABDOMEN:  Soft & nontender; normal bowel sounds; no organomegaly or masses palpated, no guarding, or rebound. ... EXT: without deformities, mod arthritic changes; no varicose veins/ +venous insuffic/ no edema. some decr ROM left hip... NEURO:  CN's intact; motor testing normal; sensory testing normal & monofilament test= norm; gait normal & balance OK. DERM:  No lesions noted; no rash etc...  RADIOLOGY DATA:  Reviewed in the EPIC EMR & discussed w/ the patient...  LABORATORY DATA:  Reviewed in the EPIC EMR & discussed w/ the patient...   Assessment & Plan:    HBP>  BP is well regulated on diet alone- she  avoids sodium & has been working on wt reduction...  VI>  Stable, she knows to avoid sodium, elevate legs, wear support hose...  CHOL>  She refuses statin meds; fortunately her  diet efforts have been rewarded w/ much better lipid numbers, keep up the good work...  DM>  On Metformin Bid & Glimep 53mQam, plus diet etc;  Labs w/ BS=116, A1c 7.4> rec to take Metform500Bid & the Glimep242mevery day!  GI> HH, IBS, Polyps>  Stable, no changes... Refer to GI for f/u colon...  DJD, LBP>  Stable on NSAID analgesics...  Osteopenia>  She still refuses Bisphos rx; continue calcium, MVI, Vit D  supplementation...  Anxiety>  Under stress w/ grand daughter... We refilled her Tranxene- she notes some tension/ mixed HAs & uses tylenol/ Goodies (her choice).   Patient's Medications  New Prescriptions   DIPHENHYD-HYDROCORT-NYSTATIN (FIRST-DUKES MOUTHWASH) SUSP    1 tsp gargle and swallow four times daily as needed  Previous Medications   ACCU-CHEK FASTCLIX LANCETS MISC    1 Units by Other route daily. Test blood sugar once daily.  Dx  250.00   ASPIRIN 81 MG TABLET    Take 81 mg by mouth daily.     BETAMETHASONE DIPROPIONATE (DIPROLENE) 0.05 % OINTMENT    apply to affected area twice a day   BLOOD GLUCOSE MONITORING SUPPL (ACCU-CHEK AVIVA PLUS) W/DEVICE KIT    1 Device by Does not apply route as directed.   CALCIUM-VITAMIN D (OSCAL WITH D) 500-200 MG-UNIT PER TABLET    Take 1 tablet by mouth 2 times per week   CLORAZEPATE (TRANXENE) 7.5 MG TABLET    Take 1 tablet (7.5 mg total) by mouth 3 (three) times daily as needed for anxiety.   FISH OIL-OMEGA-3 FATTY ACIDS 1000 MG CAPSULE    Take 1 g by mouth daily.     GLIMEPIRIDE (AMARYL) 4 MG TABLET    take 1/2  tablet once daily every morning   GLUCOSE BLOOD (ACCU-CHEK AVIVA PLUS) TEST STRIP    TEST UP TO once daily   MELOXICAM (MOBIC) 7.5 MG TABLET    take 1 tablet by mouth once daily if needed for ARTHRITIS PAIN   METFORMIN (GLUCOPHAGE) 500 MG TABLET    take 1  tablet by mouth twice a day   MULTIPLE VITAMINS-MINERALS (CENTRUM SILVER PO)    Take 1 tablet by mouth daily.    Modified Medications   No medications on file  Discontinued Medications   METHYLPREDNISOLONE (MEDROL DOSPACK) 4 MG TABLET    follow package directions

## 2013-11-25 ENCOUNTER — Encounter: Payer: Self-pay | Admitting: Podiatry

## 2013-11-25 ENCOUNTER — Ambulatory Visit (INDEPENDENT_AMBULATORY_CARE_PROVIDER_SITE_OTHER): Payer: Medicare Other | Admitting: Podiatry

## 2013-11-25 VITALS — BP 118/51 | HR 78 | Resp 12

## 2013-11-25 DIAGNOSIS — M79609 Pain in unspecified limb: Secondary | ICD-10-CM | POA: Diagnosis not present

## 2013-11-25 DIAGNOSIS — B351 Tinea unguium: Secondary | ICD-10-CM

## 2013-11-26 NOTE — Progress Notes (Signed)
Patient ID: Kimberly Aguirre, female   DOB: 06-Jul-1934, 80 y.o.   MRN: [id number]  Subjective: This 80 year old black female diabetic presents for ongoing debridement of painful mycotic toenails.  Objective: Elongated, hypertrophic, incurvated toenails x10 Minimal keratoses fourth and fifth right toe  Assessment: Onychomycoses with symptoms 6 through 10 Keratoses x2  Plan: Nails and keratoses debrided without any bleeding    Reappoint at three-month intervals

## 2013-12-03 ENCOUNTER — Telehealth: Payer: Self-pay | Admitting: Pulmonary Disease

## 2013-12-03 NOTE — Telephone Encounter (Signed)
Called and spoke with pt and she is aware that [facility] is not a urgent care.  She is aware to call and set up with a new primary care doctor.  Pt stated that she will do this and can call back if she needs help with anything further .

## 2013-12-15 ENCOUNTER — Other Ambulatory Visit: Payer: Self-pay

## 2013-12-15 DIAGNOSIS — Z1231 Encounter for screening mammogram for malignant neoplasm of breast: Secondary | ICD-10-CM

## 2013-12-21 DIAGNOSIS — H53009 Unspecified amblyopia, unspecified eye: Secondary | ICD-10-CM | POA: Diagnosis not present

## 2013-12-21 DIAGNOSIS — H2510 Age-related nuclear cataract, unspecified eye: Secondary | ICD-10-CM | POA: Diagnosis not present

## 2013-12-21 DIAGNOSIS — E119 Type 2 diabetes mellitus without complications: Secondary | ICD-10-CM | POA: Diagnosis not present

## 2013-12-21 DIAGNOSIS — H40019 Open angle with borderline findings, low risk, unspecified eye: Secondary | ICD-10-CM | POA: Diagnosis not present

## 2013-12-22 ENCOUNTER — Ambulatory Visit: Payer: Medicare Other | Admitting: Pulmonary Disease

## 2014-01-06 ENCOUNTER — Emergency Department (INDEPENDENT_AMBULATORY_CARE_PROVIDER_SITE_OTHER)
Admission: EM | Admit: 2014-01-06 | Discharge: 2014-01-06 | Disposition: A | Discharge status: Home / Self Care | Payer: Medicare Other | Source: Home / Self Care | Attending: Emergency Medicine | Admitting: Emergency Medicine

## 2014-01-06 ENCOUNTER — Encounter (HOSPITAL_COMMUNITY): Payer: Self-pay | Admitting: Emergency Medicine

## 2014-01-06 DIAGNOSIS — W57XXXA Bitten or stung by nonvenomous insect and other nonvenomous arthropods, initial encounter: Secondary | ICD-10-CM | POA: Diagnosis not present

## 2014-01-06 DIAGNOSIS — Y92009 Unspecified place in unspecified non-institutional (private) residence as the place of occurrence of the external cause: Secondary | ICD-10-CM

## 2014-01-06 DIAGNOSIS — T148 Other injury of unspecified body region: Secondary | ICD-10-CM | POA: Diagnosis not present

## 2014-01-06 MED ORDER — TRIAMCINOLONE ACETONIDE 0.1 % EX CREA: 1.0000 "application " | TOPICAL_CREAM | Freq: Three times a day (TID) | CUTANEOUS | Status: DC

## 2014-01-06 NOTE — Discharge Instructions (Signed)

## 2014-01-06 NOTE — ED Provider Notes (Signed)
  Chief Complaint    Chief Complaint  Patient presents with  . Insect Bite    History of Present Illness      Kimberly Aguirre is a 80 year old female who thinks she was bitten by an insect 3 days ago while at home. She saw a small crawling insect on her upper arm. She felt the bites. She smacked the insect and killed it. She did not bring it with her and did not examine it any further. Shortly thereafter she developed three pruritic papules on her right upper arm. She's had no difficulty breathing or wheezing. She denies any swelling of the lips, tongue, or throat. She denies any skin lesions elsewhere on her body.  Review of Systems   Other than as noted above, the patient denies any of the following symptoms: Systemic:  No fever or chills. ENT:  No nasal congestion, rhinorrhea, sore throat, swelling of lips, tongue or throat. Resp:  No cough, wheezing, or shortness of breath.  [place]    Past medical history, family history, social history, meds, and allergies were reviewed.   Physical Exam     Vital signs:  BP 119/69  Pulse 114  Temp(Src) 98.7 F (37.1 C) (Oral)  Resp 14  SpO2 99% Gen:  Alert, oriented, in no distress. ENT:  Pharynx clear, no intraoral lesions, moist mucous membranes. Lungs:  Clear to auscultation. Skin:  She has 3 erythematous papules on her right upper arm in a linear distribution. These are non-tender to touch. Skin was otherwise clear.  Assessment    The primary encounter diagnosis was Insect bite. A diagnosis of Place of occurrence, home was also pertinent to this visit.  Plan     1.  Meds:  The following meds were prescribed:   Discharge Medication List as of 01/06/2014  8:45 AM    START taking these medications   Details  triamcinolone cream (KENALOG) 0.1 % Apply 1 application topically 3 (three) times daily., Starting 01/06/2014, Until Discontinued, Normal        2.  Patient Education/Counseling:  The patient was given appropriate handouts,  self care instructions, and instructed in symptomatic relief.    3.  Follow up:  The patient was told to follow up here if no better in 3 to 4 days, or sooner if becoming worse in any way, and given some red flag symptoms such as worsening rash, fever, or difficulty breathing which would prompt immediate return.  Follow up here if necessary.      Harden Mo, MD 01/06/14 [phone]

## 2014-01-06 NOTE — ED Notes (Signed)
Concern for bite right upper arm since 6-22, NAD

## 2014-01-21 ENCOUNTER — Encounter (INDEPENDENT_AMBULATORY_CARE_PROVIDER_SITE_OTHER): Payer: Self-pay

## 2014-01-21 ENCOUNTER — Ambulatory Visit
Admission: RE | Admit: 2014-01-21 | Discharge: 2014-01-21 | Disposition: A | Discharge status: Home / Self Care | Payer: Medicare Other | Source: Ambulatory Visit

## 2014-01-21 DIAGNOSIS — Z1231 Encounter for screening mammogram for malignant neoplasm of breast: Secondary | ICD-10-CM | POA: Diagnosis not present

## 2014-02-18 ENCOUNTER — Encounter: Payer: Self-pay | Admitting: Pulmonary Disease

## 2014-02-24 ENCOUNTER — Encounter: Payer: Self-pay | Admitting: Podiatry

## 2014-02-24 ENCOUNTER — Ambulatory Visit (INDEPENDENT_AMBULATORY_CARE_PROVIDER_SITE_OTHER): Payer: Medicare Other | Admitting: Podiatry

## 2014-02-24 VITALS — BP 155/80 | HR 84 | Resp 12

## 2014-02-24 DIAGNOSIS — B351 Tinea unguium: Secondary | ICD-10-CM

## 2014-02-24 DIAGNOSIS — M79676 Pain in unspecified toe(s): Secondary | ICD-10-CM

## 2014-02-24 DIAGNOSIS — M79609 Pain in unspecified limb: Secondary | ICD-10-CM | POA: Diagnosis not present

## 2014-02-24 NOTE — Progress Notes (Signed)
Patient ID: Kimberly Aguirre, female   DOB: 03-17-1934, 80 y.o.   MRN: [id number]  Subjective: This patient presents for ongoing debridement painful toenails  Objective: Elongated, hypertrophic, incurvated toenails 6-10 Minimal keratoses dorsal fourth toes bilateral  Assessment Onychomycoses symptomatic 6-10 Keratoses x2  Plan: Nails x10 and keratoses x2 debrided without any bleeding  Reappoint x3 months

## 2014-02-25 ENCOUNTER — Telehealth: Payer: Self-pay | Admitting: *Deleted

## 2014-02-25 ENCOUNTER — Other Ambulatory Visit: Payer: Self-pay | Admitting: Pulmonary Disease

## 2014-02-25 NOTE — Telephone Encounter (Signed)
I called earlier.  So I'm just asking for a return call.  I expect a call back.

## 2014-02-25 NOTE — Telephone Encounter (Signed)
I returned her call.  She said I was there yesterday and my blood pressure was 155/80.  On my visit 3 months ago it was 118/51.  Was my blood pressure correct yesterday.  I told her as far as know.  She stated I been taking some different medication, that may have caused it to rise.  I told her there is a possibility.  She stated that is all I needed to know.  Thank you for calling me back.

## 2014-02-25 NOTE — Telephone Encounter (Signed)
In this morning, looking at my report about my blood pressure.  I need to ask a question about that and the one one 3 months ago.  Please call me back.

## 2014-02-25 NOTE — Telephone Encounter (Signed)
In this morning, I want to ask a question.  Ask about procedure I had done this morning.  This is my 3rd call today.

## 2014-02-26 ENCOUNTER — Telehealth: Payer: Self-pay | Admitting: Pulmonary Disease

## 2014-02-26 NOTE — Telephone Encounter (Signed)
Ok to add pt on any open spot when she wants to come in   thanks

## 2014-02-26 NOTE — Telephone Encounter (Signed)
Leigh please advise if there is anywhere particular you want to add patient to SN's schedule. Thanks.

## 2014-02-26 NOTE — Telephone Encounter (Signed)
Pt requesting refill Clorazepate 7.5mg  TID prn #90  Pt has appt with new PCP in Nov 2015 Wanting to know if Dr Lenna Gilford can see her one more time between now and her appt with her new PCP.  Pt states that she was last seen May 2015 and was supposed to follow x 3 months. Pt states that she is concerned with waiting until Nov to follow up with a physician regarding her Diabetes   Please advise Dr Lenna Gilford. Thanks.

## 2014-02-26 NOTE — Telephone Encounter (Signed)
Pt has been added to 03-05-14 at 9:30 am and I called Rx refill to pharmacy-spoke with tech for refill. Nothing more needed at this time.

## 2014-02-26 NOTE — Telephone Encounter (Signed)
Per SN---  Ok to refill the clorazepate SN can see her one more time before she has the appt with her new PCP>

## 2014-03-05 ENCOUNTER — Other Ambulatory Visit (INDEPENDENT_AMBULATORY_CARE_PROVIDER_SITE_OTHER): Payer: Medicare Other

## 2014-03-05 ENCOUNTER — Ambulatory Visit (INDEPENDENT_AMBULATORY_CARE_PROVIDER_SITE_OTHER): Payer: Medicare Other | Admitting: Pulmonary Disease

## 2014-03-05 ENCOUNTER — Encounter: Payer: Self-pay | Admitting: Pulmonary Disease

## 2014-03-05 VITALS — BP 112/62 | HR 82 | Temp 97.3°F | Ht 61.0 in | Wt 126.2 lb

## 2014-03-05 DIAGNOSIS — K449 Diaphragmatic hernia without obstruction or gangrene: Secondary | ICD-10-CM | POA: Diagnosis not present

## 2014-03-05 DIAGNOSIS — K589 Irritable bowel syndrome without diarrhea: Secondary | ICD-10-CM

## 2014-03-05 DIAGNOSIS — E7800 Pure hypercholesterolemia, unspecified: Secondary | ICD-10-CM

## 2014-03-05 DIAGNOSIS — D126 Benign neoplasm of colon, unspecified: Secondary | ICD-10-CM

## 2014-03-05 DIAGNOSIS — F411 Generalized anxiety disorder: Secondary | ICD-10-CM

## 2014-03-05 DIAGNOSIS — E119 Type 2 diabetes mellitus without complications: Secondary | ICD-10-CM | POA: Diagnosis not present

## 2014-03-05 DIAGNOSIS — M1990 Unspecified osteoarthritis, unspecified site: Secondary | ICD-10-CM

## 2014-03-05 DIAGNOSIS — M545 Low back pain, unspecified: Secondary | ICD-10-CM

## 2014-03-05 DIAGNOSIS — M949 Disorder of cartilage, unspecified: Secondary | ICD-10-CM

## 2014-03-05 DIAGNOSIS — I10 Essential (primary) hypertension: Secondary | ICD-10-CM

## 2014-03-05 DIAGNOSIS — N318 Other neuromuscular dysfunction of bladder: Secondary | ICD-10-CM

## 2014-03-05 DIAGNOSIS — M899 Disorder of bone, unspecified: Secondary | ICD-10-CM

## 2014-03-05 LAB — BASIC METABOLIC PANEL
BUN: 10 mg/dL (ref 6–23)
CO2: 34 mEq/L — ABNORMAL HIGH (ref 19–32)
CREATININE: 0.7 mg/dL (ref 0.4–1.2)
Calcium: 9.5 mg/dL (ref 8.4–10.5)
Chloride: 101 mEq/L (ref 96–112)
GFR: 100.64 mL/min (ref >60.00)
GLUCOSE: 74 mg/dL (ref 70–99)
POTASSIUM: 3.6 meq/L (ref 3.5–5.1)
Sodium: 140 mEq/L (ref 135–145)

## 2014-03-05 LAB — CBC WITH DIFFERENTIAL/PLATELET
Basophils Absolute: 0 10*3/uL (ref 0.0–0.1)
Basophils Relative: 0.6 % (ref 0.0–3.0)
EOS PCT: 3.2 % (ref 0.0–5.0)
Eosinophils Absolute: 0.2 10*3/uL (ref 0.0–0.7)
HCT: 40.8 % (ref 36.0–46.0)
Hemoglobin: 13.7 g/dL (ref 12.0–15.0)
Lymphocytes Relative: 31.2 % (ref 12.0–46.0)
Lymphs Abs: 1.6 10*3/uL (ref 0.7–4.0)
MCHC: 33.6 g/dL (ref 30.0–36.0)
MCV: 92.1 fl (ref 78.0–100.0)
MONOS PCT: 11 % (ref 3.0–12.0)
Monocytes Absolute: 0.6 10*3/uL (ref 0.1–1.0)
NEUTROS PCT: 54 % (ref 43.0–77.0)
Neutro Abs: 2.7 10*3/uL (ref 1.4–7.7)
PLATELETS: 224 10*3/uL (ref 150.0–400.0)
RBC: 4.43 Mil/uL (ref 3.87–5.11)
RDW: 13.7 % (ref 11.5–15.5)
WBC: 5 10*3/uL (ref 4.0–10.5)

## 2014-03-05 LAB — HEMOGLOBIN A1C: HEMOGLOBIN A1C: 7.5 % — AB (ref 4.6–6.5)

## 2014-03-05 LAB — HEPATIC FUNCTION PANEL
ALBUMIN: 3.6 g/dL (ref 3.5–5.2)
ALT: 18 U/L (ref 0–35)
AST: 22 U/L (ref 0–37)
Alkaline Phosphatase: 79 U/L (ref 39–117)
Bilirubin, Direct: 0 mg/dL (ref 0.0–0.3)
Total Bilirubin: 0.6 mg/dL (ref 0.2–1.2)
Total Protein: 7.2 g/dL (ref 6.0–8.3)

## 2014-03-05 MED ORDER — DICLOFENAC SODIUM 1 % TD GEL: TRANSDERMAL | Status: DC

## 2014-03-05 MED ORDER — CLORAZEPATE DIPOTASSIUM 7.5 MG PO TABS: ORAL_TABLET | ORAL | Status: DC

## 2014-03-05 MED ORDER — GLUCOSE BLOOD VI STRP: ORAL_STRIP | Status: DC

## 2014-03-05 NOTE — Progress Notes (Signed)
Subjective:    Patient ID: Kimberly Aguirre, female    DOB: 08/11/1933, 80 y.o.   MRN: [id number]  HPI 80 y/o BF here for a follow up visit...  he has multiple medical problems as noted below...  ~  SEE PREV EPIC NOTES FOR OLDER DATA >>   ~  August 21, 2012:  41moROV & Kimberly Aguirre notes some tension headaches that she thinks is realted to stress- she takes Tylenol or Goodies w/ relief, & she has Chlorazepate for nerves...     BP controlled on diet alone & measures 100/62 today; she denies CP, palpit, SOB, dizzy, edema; [facility]...    Lipids have been reasonable on diet + Fish Oil therapy & she prefers to avoid statin Rx...    DM control is acceptable on Metform500Bid & Glimep4/d; BS=84 (denies hypoglycemia) and A1c=7.1; she notes BS is up & down at home; Rec to continue same & monitor for any low sugatrs in case we have to lower the Glimep dose...    GI is stable w/o abd pain, n/v/d, but she notes occas constip- Rec Miralax, Senakot...    Bladder symptoms resolved, control improved...    DJD, LBP> she has heberdens nodes but not c/o much discomfort; back imoproved, prev tried Lyrica- now off... We reviewed prob list, meds, xrays and labs> see below for updates >> she had the 2013 Flu vaccine 10/13...  LABS 2/14:  Chems- wnl w/ BS=84 A1c=7.1   ~  March 10, 2013:  673moOV & Bannie tells me she is stressed out about her cardiac eval> see below... We reviewed the following medical problems during today's office visit >>     BorderlineBP> controlled on diet alone; BP= 128/60 & she denies CP, palpit, dizzy, SOB, edema, etc...    Chol> on fish oil, and refused statin Rx prev; FLP 8/14 showed TChol 186, TG 103, HDL 47, LDL 119    DM> on Metform500Bid & Glimep4m28mm; BS=135, A1c=7.5 and she may need 3rd med => rec to add Onglyza5mg29m..    GI- HH, IBS, Polyps> stable on OTC meds prn; last colonoscopy 2005 w/ 2 sm polyps & overdue for f/u procedure...    GU-  OB> symptoms much improved after her surg 12/11 (TVH, ureteral sling) & prev meds stopped...    DJD, LBP, Osteopenia> on Mobic as needed; takes calcium, MVI, VitD; refuses bisphos Rx...    Anxiety> stress from family (grand-daughter) & on Tranxene7.5 prn... We reviewed prob list, meds, xrays and labs> see below for updates >>   ADDENDUM>> 9/2 we called pt w/ results: BS=135 A1c=7.5 and wanted to add Onglyza5 but she declined additional med stating that she wasn't taking the Glimep4 "since it dropped my sugar too bad"; she never mentioned this during the OV & after discussion it is rec that she take the Metform500Bid & Glimep4-1/2 tab every AM w/ ROV recheck labs in 3-633mo.109mo~  June 22, 2013:  3-633mo R78mo recheck> when seen in Aug2014 her BS=135 but A1c=7.5 and we rec to add 3rd med (Onglyza) but then she admitted to NOT taking the Glimep4, so we compromised & added back the Glimep4mg- 120mtab Qam (f/u labs pending today); she now tells me that she is only taking the Glim2mg abo66m5/7 days each week & doesn't like to take it if FBS<130 because she feels it is too low; she did not bring list of BS checks today...Marland KitchenMarland Kitchen  BP is well controlled on diet alone & measures 110/64 today...  FLP was acceptable but LDL=119 [id number] & we stressed diet & wt reduction (since then she has gained 2#)...  Gi/ GU/ & Ortho are stable but she notes some left groin pain for which she has seen [facility] x2 in the past, asked to f/u w/ them but she notes it's not that bad yet...  She had the 2014 flu vaccine in Oct... NOTE: Epic says she has not had Pneumovax so we gave her the 23 valent vaccine today...   LABS 12/14:  Chems showed ok x BS= 116, A1c=7.4 om Metform500Bid & Glim4-1/2 daily (continue same DAILY + better diet)...  ~  October 01, 2013:  43moROV & Kimberly Aguirre indicates that she had a URI ~269mogo w/ meds called in & improved, but now c/o persist cough esp in eve w/ some beige sput; she denies CP, SOB, fever etc and she denies  any reflux symptoms;  Exam is essentially neg;  We decided to check CXR (NAD), and LABS (she forgot to go to the lab); then treat w/ Medrol dosepak, Mucinex, Fluids, Delsym... We reviewed the following medical problems during today's office visit >>     BorderlineBP> controlled on diet alone + ASA81; BP= 114/68 & she denies CP, palpit, dizzy, SOB, edema, etc...    Chol> on fish oil, and refused statin Rx prev; FLP 8/14 showed TChol 186, TG 103, HDL 47, LDL 119    DM> on Metform500Bid & Glimep4m7m/2; labs 12/14 showed BS=116, A1c=7.4 and we decided to continue same meds + diet...    GI- HH, IBS, Polyps> stable on OTC meds prn; last colonoscopy 2005 w/ 2 sm polyps & overdue for f/u procedure...    GU- OB> symptoms much improved after her surg 12/11 (TVH, ureteral sling) & prev meds stopped...    DJD, LBP, Osteopenia> on Mobic as needed; takes calcium, MVI, VitD; refuses bisphos Rx...    Anxiety> stress from family (grand-daughter) & on Tranxene7.5 prn... We reviewed prob list, meds, xrays and labs> see below for updates >>   CXR 3/15 showed norm heart size, clear lungs, NAD...  ~  Nov 17, 2013:  45mo76mo & recheck> Latese's prev cough etc resolved after the Medrol dosepak, Mucinex, etc; she has noticed some sl hoarseness in the eve & improved w/ MMW prn; otherw she is reasonably stable>>    BP is fine on diet alone & measures 120/74 today; she denies CP, palpit, SOB, edema...    Lipids are fair on diet & FishOil; not fasting today- last FLP 8/14 had LDL=119 & we reviewed diet + exercise...    DM is controlled on Metform500Bid + glimep4mg-49m tab Qam; we discussed my retirement from primary care & rec f[facility]...    She uses Mobic7.5 as needed & takes Calcium, MVI in addition...     She has Tranxene 7.5 to use prn anxiety...   ~  March 05, 2014:  3-5mo R30mo [place] yet established w/ a new Primary Care provider; she asked for this appt due to worsening arthritic complaints- esp in her hands  (right>left) on Mobic7.5 + OTC aleve, Tylenol, etc; We recently called in Pred dosepak & she is better; looks like OA & advised hot soaks, try Voltaren Gel + her current meds, offered Rheum vs Ortho if symptoms persist...     She remains on ASA81 & diet Rx for BP etc; BP= 112/62 & she remains asymptomatic  w/o CP, palpit, SOB, edema, etc...    Chol is treated w/ diet + Fish Oil; she is not fasting today for yearly blood work, we reviewed diet & exercise, she will have this checked w/ her new Primary this Autumn...    DM regulated w/ Metform500Bid & Glimep53m-1/2 Qam; Labs today showed BS= 74 & A1c=7.5, continue same + diet etc...     Other medical issues as noted-  stable... We reviewed prob list, meds, xrays and labs> see below for updates >> she requests refills of test strips, & Chlorazepate...  LABS 8/15:  Chems- ok w/ BS=74, A1c=7.5;  CBC- wnl;  TSH=0.63;               Problem List:   BLINDNESS, LEFT EYE (ICD-369.60) - ? birth defect, she had eye surgery age 80 followed by DrHecker with mild bilat diabetic retinopathy reported 2/11 (she plans f/u in 637mo  ESSENTIAL HYPERTENSION, BORDERLINE (ICD-401.9) - controlled on diet... ~  2/13:  BP= 114/72 & denies HA, fatigue, visual changes, CP, palipit, dizziness, syncope, dyspnea, edema, etc... ~  CXR 2/13 showed heart at upper lim normal, min bronchitic changes, clear, NAD ~  8/13:  BP= 112/68 & she denies CP, palpit, SOB, edema, etc;  K=3.8, BUN=14, Creat=0.8 ~  2/14: BP controlled on diet alone & measures 100/62 today; she denies CP, palpit, SOB, dizzy, edema; [facility].  ~  2DEcho 5/14 showed mild LVH, norm LVF w/ EF=55-60%, no regional wall motion abn, Gr1DD, mild MR...  ~  8/14 & 12/14: controlled on diet alone; BP= 128/60 or better & she denies CP, palpit, dizzy, SOB, edema, etc... ~  CXR 3/15 showed norm heart size, clear lungs, NAD;  She had URI w/ persist cough- we decided to treat w/  Dosepak, Mucinex, etc... ~  8/15: She remains on ASA81 & diet Rx for BP etc; BP= 112/62 & she remains asymptomatic w/o CP, palpit, SOB, edema, etc.  CHEST PAIN, ATYPICAL (ICD-786.59) - hx atypical CP 2010> sharp, right chest, noticed at night & not days, 2-4/10 severity, no assoc n/v/diaphoresis, resolves spont in a few minutes... baseline EKG= NSR, WNL...Marland Kitchenhad NuclearStressTest Dec00 which was neg- no infarct or ischemia, EF= 77%... no known coronary dis, but mult risk factors= DM, Chol, +FamHx strokes... [facility]...  ~  EKG 5/14 showed NSR, rate81, poss LAE, otherw wnl, NAD...   VENOUS INSUFFICIENCY (ICD-459.81) - follows low sodium diet, elevates legs, support hose... no swelling.  HYPERCHOLESTEROLEMIA (ICD-272.0) - on FISH OIL daily, she stopped her prev Simvastatin 2090mn her own... refuses statin therapy due to pain and swelling in her ankles, and she just wants Diet Rx alone... ~  FLP[place]08 showed TChol 147, TG 102, HDL 35, LDL 92... ? on her simvastatin 30m34m ~  FLP 8/09 showed TChol 163, TG 129, HDL 29, LDL 109... she prefers diet alone- discussed! ~  FLP 8/10 showed TChol 211, Tg 187, HDL 41, LDL 146...offered Prav40 w/ hope fewer side effects. ~  1/11: she reports trial Prav40 but stopped this due to "cramping in joints, esp ankles"... ~  FLP [place] on diet alone showed TChol 189, TG 155, HDL 40, LDL 118... "I'm committed to diet & ex" ~  FLP [place] showed TChol 220, TG 182, HDL 44, LDL 157... she confirms her refusal of statin rx. ~  FLP 8/12 showed TChol 179, TG 116, HDL 47, LDL 109...Marland KitchenMarland Kitchen  Much improved on diet, continue same! ~  [place] 8/13 showed TChol 177, TG 93, HDL 49, LDL 109 ~  FLP 8/14 showed TChol 186, TG 103, HDL 47, LDL 119   DIABETES MELLITUS (ICD-250.00) - on METFORMIN 575mBid & AMARYL 448md...  ~  labs 12/08 showed BS=101, HgA1c=7.3...Marland KitchenMarland Kitchenec to add Actos but pt didn't fill this Rx. ~  labs 8/09 showed BS= 146, HgA1c= 7.4...Marland KitchenMarland Kitchenec incr Metform 2Bid +  Amaryl 36m736m...  ~  note- pt states intol to Metform 2Bid and had to decr back to 1Bid + the Glimep... ~  labs 2/10 showed BS= 53, A1c= 7.0... Marland KitchenMarland Kitchenc same meds, better diet, may need decr Glimep. ~  labs 8/10 showed BS= 127, A1c= 7.0... Marland Kitchenec> keep same, better diet. ~  1/11: she reports BS at home all in the 100-140+ range... ~  labs 8/11 on Metfor500Bid+Glim4 showed BS= 151, A1c= 7.2 ~  labs 2/12 showed BS= 158, A1c= 7.2... Marland KitchenMarland Kitchenntinue same + better diet. ~  Labs 8/12 showed BS= 129, A1c= 7.0... Marland KitchenMarland Kitchenproved> same meds + diet! ~  Labs 2/13 on Metform500Bid+Glimep4 showed BS= 196, A1c= 7.2... Next step add 3rd med. ~  Labs 8/13 on Metform500Bid+Glimep4 showed BS= 153, A1c= 7.1 ~  Labs 2/14 on Metform500Bid+Glim4 showed BS= 84, A1c= 7.1 ~  Ophthalmology check 6/14 by DrHecker- mild background DM retinopathy & Glaucoma suspect... ~  Labs 8/14 on Metform500Bid+Glim4 (not!) showed BS= 135, A1c= 7.5... Marland KitchenMarland Kitchenc to add ONGLYZA5, then she admitted to not taking the Glim4 due to low sugars, so take Glim4- 1/2 tab daily! ~  Labs 12/14 on Metform500Bid+Glim2 (taking5/7d) showed BS= 116, A1c= 7.4 and take it daily! ~  8/15: DM regulated w/ Metform500Bid & Glimep36mg23m2 Qam; Labs today showed BS= 74 & A1c=7.5, continue same + diet etc.   HIATAL HERNIA (ICD-553.3) - last EGD was 1980 showing 2cmHH, mild esophagitis and gastritis...  IRRITABLE BOWEL SYNDROME (ICD-564.1) - she uses Miralax, Senakot as needed...  COLONIC POLYPS (ICD-211.3) - last colonoscopy 7/05 by DrBrodie showed 2-3mm 92myps... f/u planned 15yrs.79yr8/12: had pain in left groin area on & off w/ eval from GYN & given Ibuprofen which helped... ~  8/14: she has not had f/u colon & we will refer to DrDBrodie for recheck...  OVERACTIVE BLADDER (ICD-596.51) - she notes symptoms c/w overactive bladder 8/11 & we discussed trial of Toviaz... symptoms much improved after surg 12/11 by drDickstein & Mody [facility] and Toviaz stopped...  DEGENERATIVE  JOINT DISEASE (ICD-715.90) - right hip DJD per DrGioffre w/ shot in 2006... repeat eval Spring2011 and nothing found according to the pt "I'm his mystery pt" she says... takes MOBIC 7.5mg Pr71m/ some relief...  BACK PAIN, LUMBAR (ICD-724.2) - she prev had some leg pain at night, ?neuropathy and tried Lyrica... ~  1/11:  denies back pain etc...  OSTEOPENIA (ICD-733.90) - on OSCAL Bid + vits daily, she stopped Fosamax on her own & states intol to bisphosphonates...  ~  BMD 5/08 showed TScores -1.1 to -1.5... & ViMarland KitchenD level=64 in 8/08... rec to start Fosamax- pt refused. ~  BMD 9/11 showed TScores -2.0 in Spine, and -1.9 in left FemNeck... pt refuses bisphos rx. ~  she has osteopenia followed by GYN but pt refuses bisphos therapy- on calcium, vits.  ~  Labs 8/14 showed Vit D level = 52  HEADACHE (ICD-784.0)  ANXIETY (ICD-300.00) - she uses CHLORAZEPATE 7.5mg Prn46mnder stress w/ grand daughter; med refilled...  SCREENINCairo  MAINT:  ~  NuclearStressTest 12/00 was neg for infarct or ischemia, EF=77%... ~  Pneumovax shot 2003 at age 20; we gave her a second vaccination 12/14 per epic notification... ~  Tetanus shot 2004 w/ local inflamm reaction. ~  GYN= DrDickstein et al...   Past Surgical History  Procedure Laterality Date  . Eye surgery      age 9  . Vaginal hysterectomy      and ureteral sling 06/2010 by Dr. Cristine Polio    Outpatient Encounter Prescriptions as of 03/05/2014  Medication Sig  . ACCU-CHEK FASTCLIX LANCETS MISC 1 Units by Other route daily. Test blood sugar once daily.  Dx  250.00  . aspirin 81 MG tablet Take 81 mg by mouth daily.    . betamethasone dipropionate (DIPROLENE) 0.05 % ointment apply to affected area twice a day  . Blood Glucose Monitoring Suppl (ACCU-CHEK AVIVA PLUS) W/DEVICE KIT 1 Device by Does not apply route as directed.  . calcium-vitamin D (OSCAL WITH D) 500-200 MG-UNIT per tablet Take 1 tablet by mouth 2 times per week  . clorazepate  (TRANXENE) 7.5 MG tablet TAKE 1 TABLET BY MOUTH 3 TIMES DAILY AS NEEDED  . Diphenhyd-Hydrocort-Nystatin (FIRST-DUKES MOUTHWASH) SUSP 1 tsp gargle and swallow four times daily as needed  . fish oil-omega-3 fatty acids 1000 MG capsule Take 1 g by mouth daily.    [name] glimepiride (AMARYL) 4 MG tablet take 1/2  tablet once daily every morning  . glucose blood (ACCU-CHEK AVIVA PLUS) test strip TEST UP TO once daily  . meloxicam (MOBIC) 7.5 MG tablet take 1 tablet by mouth once daily if needed for ARTHRITIS PAIN  . metFORMIN (GLUCOPHAGE) 500 MG tablet take 1 tablet by mouth twice a day  . Multiple Vitamins-Minerals (CENTRUM SILVER PO) Take 1 tablet by mouth daily.    [name] triamcinolone cream (KENALOG) 0.1 % Apply 1 application topically 3 (three) times daily.  . [DISCONTINUED] clorazepate (TRANXENE) 7.5 MG tablet TAKE 1 TABLET BY MOUTH 3 TIMES DAILY AS NEEDED  . [DISCONTINUED] glucose blood (ACCU-CHEK AVIVA PLUS) test strip TEST UP TO once daily  . diclofenac sodium (VOLTAREN) 1 % GEL Use as directed  . [DISCONTINUED] methylPREDNIsolone (MEDROL DOSPACK) 4 MG tablet     Allergies  Allergen Reactions  . Codeine     REACTION: nausea    Current Medications, Allergies, Past Medical History, Past Surgical History, Family History, and Social History were reviewed in [employer] record.   Review of Systems    See HPI - all other systems neg except as noted...  The patient denies anorexia, fever, weight loss, weight gain, vision loss, decreased hearing, hoarseness, chest pain, syncope, dyspnea on exertion, peripheral edema, prolonged cough, headaches, hemoptysis, abdominal pain, melena, hematochezia, severe indigestion/heartburn, hematuria, incontinence, muscle weakness, suspicious skin lesions, transient blindness, difficulty walking, depression, unusual weight change, abnormal bleeding, enlarged lymph nodes, and angioedema.   Objective:   Physical Exam     WD, WN, 80 y/o BF in  NAD... GENERAL:  Alert & oriented; pleasant & cooperative... HEENT:  Roe/AT, Ophthal per DrHecker, EACs-clear, TMs-wnl, NOSE-clear, THROAT-clear & wnl. NECK:  Supple w/ fairROM; no JVD; normal carotid impulses w/o bruits; no thyromegaly or nodules palpated; no lymphadenopathy. CHEST:  Clear to P & A; without wheezes/ rales/ or rhonchi heard... HEART:  Regular Rhythm; without murmurs/ rubs/ or gallops detected... ABDOMEN:  Soft & nontender; normal bowel sounds; no organomegaly or masses palpated, no guarding, or rebound. ... EXT: without deformities, mod arthritic  changes; no varicose veins/ +venous insuffic/ no edema. some decr ROM left hip... NEURO:  CN's intact; motor testing normal; sensory testing normal & monofilament test= norm; gait normal & balance OK. DERM:  No lesions noted; no rash etc...  RADIOLOGY DATA:  Reviewed in the EPIC EMR & discussed w/ the patient...  LABORATORY DATA:  Reviewed in the EPIC EMR & discussed w/ the patient...   Assessment & Plan:    HBP>  BP is well regulated on diet alone- she avoids sodium & has been working on wt reduction...  VI>  Stable, she knows to avoid sodium, elevate legs, wear support hose...  CHOL>  She refuses statin meds; fortunately her  diet efforts have been rewarded w/ much better lipid numbers, keep up the good work...  DM>  On Metformin Bid & Glimep 26mQam, plus diet etc;  Labs w/ BS=74, A1c 7.5> rec to take Metform500Bid & the Glimep211mevery day!  GI> HH, IBS, Polyps>  Stable, no changes... Refer to GI for f/u colon...  DJD, LBP>  Stable on NSAID analgesics...  Osteopenia>  She still refuses Bisphos rx; continue calcium, MVI, Vit D supplementation...  Anxiety>  Under stress w/ grand daughter... We refilled her Tranxene- she notes some tension/ mixed HAs & uses tylenol/ Goodies (her choice).   Patient's Medications  New Prescriptions   DICLOFENAC SODIUM (VOLTAREN) 1 % GEL    Use as directed  Previous Medications    ACCU-CHEK FASTCLIX LANCETS MISC    1 Units by Other route daily. Test blood sugar once daily.  Dx  250.00   ASPIRIN 81 MG TABLET    Take 81 mg by mouth daily.     BETAMETHASONE DIPROPIONATE (DIPROLENE) 0.05 % OINTMENT    apply to affected area twice a day   BLOOD GLUCOSE MONITORING SUPPL (ACCU-CHEK AVIVA PLUS) W/DEVICE KIT    1 Device by Does not apply route as directed.   CALCIUM-VITAMIN D (OSCAL WITH D) 500-200 MG-UNIT PER TABLET    Take 1 tablet by mouth 2 times per week   DIPHENHYD-HYDROCORT-NYSTATIN (FIRST-DUKES MOUTHWASH) SUSP    1 tsp gargle and swallow four times daily as needed   FISH OIL-OMEGA-3 FATTY ACIDS 1000 MG CAPSULE    Take 1 g by mouth daily.     GLIMEPIRIDE (AMARYL) 4 MG TABLET    take 1/2  tablet once daily every morning   MELOXICAM (MOBIC) 7.5 MG TABLET    take 1 tablet by mouth once daily if needed for ARTHRITIS PAIN   METFORMIN (GLUCOPHAGE) 500 MG TABLET    take 1 tablet by mouth twice a day   MULTIPLE VITAMINS-MINERALS (CENTRUM SILVER PO)    Take 1 tablet by mouth daily.     TRIAMCINOLONE CREAM (KENALOG) 0.1 %    Apply 1 application topically 3 (three) times daily.  Modified Medications   Modified Medication Previous Medication   CLORAZEPATE (TRANXENE) 7.5 MG TABLET clorazepate (TRANXENE) 7.5 MG tablet      TAKE 1 TABLET BY MOUTH 3 TIMES DAILY AS NEEDED    TAKE 1 TABLET BY MOUTH 3 TIMES DAILY AS NEEDED   GLUCOSE BLOOD (ACCU-CHEK AVIVA PLUS) TEST STRIP glucose blood (ACCU-CHEK AVIVA PLUS) test strip      TEST UP TO once daily    TEST UP TO once daily  Discontinued Medications   METHYLPREDNISOLONE (MEDROL DOSPACK) 4 MG TABLET

## 2014-03-05 NOTE — Patient Instructions (Signed)
Today we updated your med list in our EPIC system...    Continue your current medications the same...  We will add VOLTAREN GEL to rub into your painful joints as needed...  Today we did your follow up non-fasting blod work...    We will contact you w/ the results when available...   Call for any questions or if we can be of service in any way.Marland KitchenMarland Kitchen

## 2014-03-06 LAB — TSH: TSH: 0.63 u[IU]/mL (ref 0.35–4.50)

## 2014-04-16 ENCOUNTER — Ambulatory Visit: Payer: Medicare Other

## 2014-04-19 ENCOUNTER — Ambulatory Visit (INDEPENDENT_AMBULATORY_CARE_PROVIDER_SITE_OTHER): Payer: Medicare Other

## 2014-04-19 DIAGNOSIS — Z23 Encounter for immunization: Secondary | ICD-10-CM | POA: Diagnosis not present

## 2014-04-26 ENCOUNTER — Other Ambulatory Visit: Payer: Self-pay | Admitting: Pulmonary Disease

## 2014-04-27 ENCOUNTER — Other Ambulatory Visit: Payer: Self-pay | Admitting: Pulmonary Disease

## 2014-04-29 ENCOUNTER — Telehealth: Payer: Self-pay | Admitting: Pulmonary Disease

## 2014-04-29 MED ORDER — CLORAZEPATE DIPOTASSIUM 7.5 MG PO TABS: ORAL_TABLET | ORAL | Status: DC

## 2014-04-29 NOTE — Telephone Encounter (Signed)
Pt calling to check on status of prescript please advise ok to leave a message.Kimberly Aguirre

## 2014-04-29 NOTE — Telephone Encounter (Signed)
Called and lmom to make the pt aware that the rx has been called to the pharmacy.  i have called the rx to the pharmacy and nothing further is needed.

## 2014-05-21 ENCOUNTER — Ambulatory Visit (INDEPENDENT_AMBULATORY_CARE_PROVIDER_SITE_OTHER): Payer: Medicare Other | Admitting: Internal Medicine

## 2014-05-21 ENCOUNTER — Encounter: Payer: Self-pay | Admitting: Internal Medicine

## 2014-05-21 VITALS — BP 118/68 | HR 80 | Temp 97.8°F | Resp 14 | Ht 61.0 in | Wt 126.0 lb

## 2014-05-21 DIAGNOSIS — E669 Obesity, unspecified: Secondary | ICD-10-CM

## 2014-05-21 DIAGNOSIS — E1141 Type 2 diabetes mellitus with diabetic mononeuropathy: Secondary | ICD-10-CM | POA: Diagnosis not present

## 2014-05-21 DIAGNOSIS — E7800 Pure hypercholesterolemia, unspecified: Secondary | ICD-10-CM

## 2014-05-21 DIAGNOSIS — I10 Essential (primary) hypertension: Secondary | ICD-10-CM | POA: Diagnosis not present

## 2014-05-21 DIAGNOSIS — E1169 Type 2 diabetes mellitus with other specified complication: Secondary | ICD-10-CM

## 2014-05-21 DIAGNOSIS — E119 Type 2 diabetes mellitus without complications: Secondary | ICD-10-CM

## 2014-05-21 NOTE — Patient Instructions (Signed)
We will see back in about 4-5 months for blood work and to check on the diabetes.  Keep up the good work with exercise.   Diabetes Mellitus and Food It is important for you to manage your blood sugar (glucose) level. Your blood glucose level can be greatly affected by what you eat. Eating healthier foods in the appropriate amounts throughout the day at about the same time each day will help you control your blood glucose level. It can also help slow or prevent worsening of your diabetes mellitus. Healthy eating may even help you improve the level of your blood pressure and reach or maintain a healthy weight.  HOW CAN FOOD AFFECT ME? Carbohydrates Carbohydrates affect your blood glucose level more than any other type of food. Your dietitian will help you determine how many carbohydrates to eat at each meal and teach you how to count carbohydrates. Counting carbohydrates is important to keep your blood glucose at a healthy level, especially if you are using insulin or taking certain medicines for diabetes mellitus. Alcohol Alcohol can cause sudden decreases in blood glucose (hypoglycemia), especially if you use insulin or take certain medicines for diabetes mellitus. Hypoglycemia can be a life-threatening condition. Symptoms of hypoglycemia (sleepiness, dizziness, and disorientation) are similar to symptoms of having too much alcohol.  If your health care provider has given you approval to drink alcohol, do so in moderation and use the following guidelines:  Women should not have more than one drink per day, and men should not have more than two drinks per day. One drink is equal to:  12 oz of beer.  5 oz of wine.  1 oz of hard liquor.  Do not drink on an empty stomach.  Keep yourself hydrated. Have water, diet soda, or unsweetened iced tea.  Regular soda, juice, and other mixers might contain a lot of carbohydrates and should be counted. WHAT FOODS ARE NOT RECOMMENDED? As you make food  choices, it is important to remember that all foods are not the same. Some foods have fewer nutrients per serving than other foods, even though they might have the same number of calories or carbohydrates. It is difficult to get your body what it needs when you eat foods with fewer nutrients. Examples of foods that you should avoid that are high in calories and carbohydrates but low in nutrients include:  Trans fats (most processed foods list trans fats on the Nutrition Facts label).  Regular soda.  Juice.  Candy.  Sweets, such as cake, pie, doughnuts, and cookies.  Fried foods. WHAT FOODS CAN I EAT? Have nutrient-rich foods, which will nourish your body and keep you healthy. The food you should eat also will depend on several factors, including:  The calories you need.  The medicines you take.  Your weight.  Your blood glucose level.  Your blood pressure level.  Your cholesterol level. You also should eat a variety of foods, including:  Protein, such as meat, poultry, fish, tofu, nuts, and seeds (lean animal proteins are best).  Fruits.  Vegetables.  Dairy products, such as milk, cheese, and yogurt (low fat is best).  Breads, grains, pasta, cereal, rice, and beans.  Fats such as olive oil, trans fat-free margarine, canola oil, avocado, and olives. DOES EVERYONE WITH DIABETES MELLITUS HAVE THE SAME MEAL PLAN? Because every person with diabetes mellitus is different, there is not one meal plan that works for everyone. It is very important that you meet with a dietitian who will help  you create a meal plan that is just right for you. Document Released: 03/29/2005 Document Revised: 07/07/2013 Document Reviewed: 05/29/2013 [facility] Patient Information 2015 [place], [place]. This information is not intended to replace advice given to you by your health care provider. Make sure you discuss any questions you have with your health care provider.

## 2014-05-21 NOTE — Progress Notes (Signed)
Pre visit review using our clinic review tool, if applicable. No additional management support is needed unless otherwise documented below in the visit note. 

## 2014-05-24 NOTE — Assessment & Plan Note (Signed)
BP normal today however would like to start low-dose ACE inhibitor at next visit if patient agrees.

## 2014-05-24 NOTE — Assessment & Plan Note (Signed)
Recent blood work reviewed. Last hemoglobin A1c 7.5. Acceptable. She currently takes sulfonylurea and metformin. No hypoglycemia. Foot exam done today. Reminded her about eye exam. Unclear why she is not on ACE inhibitor or ARB. She did not wish to start any new medication today. We'll talk with her again next visit.

## 2014-05-24 NOTE — Progress Notes (Signed)
   Subjective:    Patient ID: Kimberly Aguirre, female    DOB: 1934-03-14, 80 y.o.   MRN: [id number]  HPI The patient is a 80 year old female who is coming in today to establish care. She has past medical history of diabetes mellitus type 2, hypertension, hyperlipidemia, anxiety. She is doing fairly well overall. She denies any hypo-or hyperglycemia. She denies any recent falls. She does have a few aches and pains of her joints but denies any balance problems.  Review of Systems  Constitutional: Negative for fever, activity change, appetite change, fatigue and unexpected weight change.  HENT: Negative.   Eyes: Negative.   Respiratory: Negative for cough, chest tightness, shortness of breath and wheezing.   Cardiovascular: Negative for chest pain, palpitations and leg swelling.  Gastrointestinal: Negative for nausea, abdominal pain, diarrhea, constipation and abdominal distention.  Musculoskeletal: Positive for arthralgias.  Skin: Negative.   Neurological: Negative.       Objective:   Physical Exam  Constitutional: She is oriented to person, place, and time. She appears well-developed and well-nourished.  overweight  HENT:  Head: Normocephalic and atraumatic.  Eyes: EOM are normal.  Neck: Normal range of motion.  Cardiovascular: Normal rate and regular rhythm.   Pulmonary/Chest: Effort normal and breath sounds normal. No respiratory distress. She has no wheezes. She has no rales.  Abdominal: Soft. Bowel sounds are normal.  Musculoskeletal: She exhibits no edema.  Neurological: She is alert and oriented to person, place, and time. Coordination normal.  Skin: Skin is warm and dry.  See foot exam   Filed Vitals:   05/21/14 0852  BP: 118/68  Pulse: 80  Temp: 97.8 F (36.6 C)  TempSrc: Oral  Resp: 14  Height: 5\' 1"  (1.549 m)  Weight: 126 lb (57.153 kg)  SpO2: 97%      Assessment & Plan:

## 2014-05-24 NOTE — Assessment & Plan Note (Signed)
Will recheck lipid panel at next visit. Reviewed recent lipid panel which was close to goal.

## 2014-06-02 ENCOUNTER — Ambulatory Visit (INDEPENDENT_AMBULATORY_CARE_PROVIDER_SITE_OTHER): Payer: Medicare Other | Admitting: Podiatry

## 2014-06-02 ENCOUNTER — Encounter: Payer: Self-pay | Admitting: Podiatry

## 2014-06-02 DIAGNOSIS — B351 Tinea unguium: Secondary | ICD-10-CM

## 2014-06-02 DIAGNOSIS — M79676 Pain in unspecified toe(s): Secondary | ICD-10-CM

## 2014-06-03 NOTE — Progress Notes (Signed)
Patient ID: Kimberly Aguirre, female   DOB: 1934-06-17, 80 y.o.   MRN: [id number]  Subjective: This patient presents for ongoing debridement of painful toenails  Objective: The toenails are incurvated, elongated, hypertrophic 6-10 Small keratoses dorsal fourth toes bilaterally  Assessment: Symptomatic onychomycoses 6-10 Keratoses 2  Plan: Debrided nails 10 and keratoses 2 without a bleeding  Reappoint 3 months

## 2014-07-02 ENCOUNTER — Other Ambulatory Visit: Payer: Self-pay | Admitting: Pulmonary Disease

## 2014-07-05 ENCOUNTER — Other Ambulatory Visit: Payer: Self-pay | Admitting: *Deleted

## 2014-07-05 MED ORDER — GLUCOSE BLOOD VI STRP: 1.0000 | ORAL_STRIP | Freq: Two times a day (BID) | Status: DC

## 2014-07-05 MED ORDER — ACCU-CHEK FASTCLIX LANCETS MISC: 1.0000 [IU] | Freq: Two times a day (BID) | Status: DC

## 2014-07-05 NOTE — Telephone Encounter (Signed)
Left msg on triage needing refills on acuu-chek lancets & strips. pls [facility] to [facility]. Notified pt [facility]...Kimberly Aguirre

## 2014-09-01 ENCOUNTER — Encounter: Payer: Self-pay | Admitting: Podiatry

## 2014-09-01 ENCOUNTER — Ambulatory Visit (INDEPENDENT_AMBULATORY_CARE_PROVIDER_SITE_OTHER): Payer: Medicare Other | Admitting: Podiatry

## 2014-09-01 DIAGNOSIS — B351 Tinea unguium: Secondary | ICD-10-CM | POA: Diagnosis not present

## 2014-09-01 DIAGNOSIS — M79676 Pain in unspecified toe(s): Secondary | ICD-10-CM

## 2014-09-01 NOTE — Patient Instructions (Signed)
Diabetes and Foot Care Diabetes may cause you to have problems because of poor blood supply (circulation) to your feet and legs. This may cause the skin on your feet to become thinner, break easier, and heal more slowly. Your skin may become dry, and the skin may peel and crack. You may also have nerve damage in your legs and feet causing decreased feeling in them. You may not notice minor injuries to your feet that could lead to infections or more serious problems. Taking care of your feet is one of the most important things you can do for yourself.  HOME CARE INSTRUCTIONS  Wear shoes at all times, even in the house. Do not go barefoot. Bare feet are easily injured.  Check your feet daily for blisters, cuts, and redness. If you cannot see the bottom of your feet, use a mirror or ask someone for help.  Wash your feet with warm water (do not use hot water) and mild soap. Then pat your feet and the areas between your toes until they are completely dry. Do not soak your feet as this can dry your skin.  Apply a moisturizing lotion or petroleum jelly (that does not contain alcohol and is unscented) to the skin on your feet and to dry, brittle toenails. Do not apply lotion between your toes.  Trim your toenails straight across. Do not dig under them or around the cuticle. File the edges of your nails with an emery board or nail file.  Do not cut corns or calluses or try to remove them with medicine.  Wear clean socks or stockings every day. Make sure they are not too tight. Do not wear knee-high stockings since they may decrease blood flow to your legs.  Wear shoes that fit properly and have enough cushioning. To break in new shoes, wear them for just a few hours a day. This prevents you from injuring your feet. Always look in your shoes before you put them on to be sure there are no objects inside.  Do not cross your legs. This may decrease the blood flow to your feet.  If you find a minor scrape,  cut, or break in the skin on your feet, keep it and the skin around it clean and dry. These areas may be cleansed with mild soap and water. Do not cleanse the area with peroxide, alcohol, or iodine.  When you remove an adhesive bandage, be sure not to damage the skin around it.  If you have a wound, look at it several times a day to make sure it is healing.  Do not use heating pads or hot water bottles. They may burn your skin. If you have lost feeling in your feet or legs, you may not know it is happening until it is too late.  Make sure your health care provider performs a complete foot exam at least annually or more often if you have foot problems. Report any cuts, sores, or bruises to your health care provider immediately. SEEK MEDICAL CARE IF:   You have an injury that is not healing.  You have cuts or breaks in the skin.  You have an ingrown nail.  You notice redness on your legs or feet.  You feel burning or tingling in your legs or feet.  You have pain or cramps in your legs and feet.  Your legs or feet are numb.  Your feet always feel cold. SEEK IMMEDIATE MEDICAL CARE IF:   There is increasing redness,   swelling, or pain in or around a wound.  There is a red line that goes up your leg.  Pus is coming from a wound.  You develop a fever or as directed by your health care provider.  You notice a bad smell coming from an ulcer or wound. Document Released: 06/29/2000 Document Revised: 03/04/2013 Document Reviewed: 12/09/2012 [employer] Patient Information 2015 [employer]. This information is not intended to replace advice given to you by your health care provider. Make sure you discuss any questions you have with your health care provider.  

## 2014-09-02 NOTE — Progress Notes (Signed)
Patient ID: Kimberly Aguirre, female   DOB: Oct 05, 1933, 81 y.o.   MRN: [id number]  Subjective: This patient presents complaining of painful toenails and keratoses on the fourth toes bilaterally  Objective: The toenails are elongated, hypertrophic, incurvated, discolored and tender to palpation 6-10 Keratoses dorsal fourth toes bilaterally  Assessment: Symptomatic onychomycoses 6-10 Keratoses 2 Diabetic  Plan: Debridement of toenails 10 and keratoses 2 without a bleeding Attach protective foam pad on thefourth right toe  Reappoint at three-month intervals

## 2014-09-20 ENCOUNTER — Ambulatory Visit: Payer: Medicare Other | Admitting: Internal Medicine

## 2014-09-24 ENCOUNTER — Encounter: Payer: Self-pay | Admitting: Internal Medicine

## 2014-09-24 ENCOUNTER — Ambulatory Visit (INDEPENDENT_AMBULATORY_CARE_PROVIDER_SITE_OTHER): Payer: Medicare Other | Admitting: Internal Medicine

## 2014-09-24 ENCOUNTER — Other Ambulatory Visit (INDEPENDENT_AMBULATORY_CARE_PROVIDER_SITE_OTHER): Payer: Medicare Other

## 2014-09-24 VITALS — BP 110/62 | HR 86 | Temp 98.2°F | Resp 14 | Ht 61.0 in | Wt 121.8 lb

## 2014-09-24 DIAGNOSIS — E119 Type 2 diabetes mellitus without complications: Secondary | ICD-10-CM

## 2014-09-24 DIAGNOSIS — E669 Obesity, unspecified: Secondary | ICD-10-CM

## 2014-09-24 DIAGNOSIS — E1169 Type 2 diabetes mellitus with other specified complication: Secondary | ICD-10-CM

## 2014-09-24 LAB — HEMOGLOBIN A1C: HEMOGLOBIN A1C: 7.6 % — AB (ref 4.6–6.5)

## 2014-09-24 LAB — MICROALBUMIN / CREATININE URINE RATIO
Creatinine,U: 120.1 mg/dL
MICROALB UR: 2 mg/dL — AB (ref 0.0–1.9)
MICROALB/CREAT RATIO: 1.7 mg/g (ref 0.0–30.0)

## 2014-09-24 NOTE — Patient Instructions (Signed)
We will check the blood work today for the diabetes. We will call you back with the results.   We will see you back in about 6 months to check in on how you are doing. If you have any problems or questions please feel free to call the office.  You are doing well with the blood pressure and the diabetes and we are not changing your medicines today.  You can use the mobic (meloxicam) for the arthritis pain if you need it. If you have more problems we can always send you back to the orthopedic doctor.

## 2014-09-24 NOTE — Progress Notes (Signed)
Pre visit review using our clinic review tool, if applicable. No additional management support is needed unless otherwise documented below in the visit note. 

## 2014-09-26 NOTE — Assessment & Plan Note (Signed)
Check microalbumin to creatinine ratio for signs of kidney damage. BP low today and consideration for adding an ACE-I or ARB depending on results. Check HgA1c, continue amaryl and metformin for now. Adjust as needed. Encouraged exercise as a good way to keep herself at goal. Goal <8 HgA1c.

## 2014-09-26 NOTE — Progress Notes (Signed)
   Subjective:    Patient ID: Kimberly Aguirre, female    DOB: 1934-03-19, 81 y.o.   MRN: [id number]  HPI The patient is a 81 YO female who is coming in to follow up on her diabetes. She has struggled with it for many years and has been stable on amaryl and metformin for some time. She does have some occasional numbness but nothing that stays with her all the time. No hypoglycemia. She checks her sugar in the morning and it is generally 110-130. She has been trying to exercise a little bit but this is a struggle for her. No other changes and no other new complaints.   Review of Systems  Constitutional: Negative for fever, activity change, appetite change, fatigue and unexpected weight change.  HENT: Negative.   Eyes: Negative.   Respiratory: Negative for cough, chest tightness, shortness of breath and wheezing.   Cardiovascular: Negative for chest pain, palpitations and leg swelling.  Gastrointestinal: Negative for nausea, abdominal pain, diarrhea, constipation and abdominal distention.  Musculoskeletal: Positive for arthralgias.  Skin: Negative.   Neurological: Negative.       Objective:   Physical Exam  Constitutional: She is oriented to person, place, and time. She appears well-developed and well-nourished.  HENT:  Head: Normocephalic and atraumatic.  Eyes: EOM are normal.  Neck: Normal range of motion.  Cardiovascular: Normal rate and regular rhythm.   Pulmonary/Chest: Effort normal and breath sounds normal. No respiratory distress. She has no wheezes. She has no rales.  Abdominal: Soft. Bowel sounds are normal.  Musculoskeletal: She exhibits no edema.  Neurological: She is alert and oriented to person, place, and time. Coordination normal.  Skin: Skin is warm and dry.   Filed Vitals:   09/24/14 0921  BP: 110/62  Pulse: 86  Temp: 98.2 F (36.8 C)  TempSrc: Oral  Resp: 14  Height: 5\' 1"  (1.549 m)  Weight: 121 lb 12.8 oz (55.248 kg)  SpO2: 97%     Assessment & Plan:

## 2014-11-17 ENCOUNTER — Encounter: Payer: Self-pay | Admitting: Podiatry

## 2014-11-17 ENCOUNTER — Ambulatory Visit (INDEPENDENT_AMBULATORY_CARE_PROVIDER_SITE_OTHER): Payer: Medicare Other | Admitting: Podiatry

## 2014-11-17 DIAGNOSIS — B351 Tinea unguium: Secondary | ICD-10-CM

## 2014-11-17 DIAGNOSIS — M79676 Pain in unspecified toe(s): Secondary | ICD-10-CM | POA: Diagnosis not present

## 2014-11-17 NOTE — Patient Instructions (Signed)
Diabetes and Foot Care Diabetes may cause you to have problems because of poor blood supply (circulation) to your feet and legs. This may cause the skin on your feet to become thinner, break easier, and heal more slowly. Your skin may become dry, and the skin may peel and crack. You may also have nerve damage in your legs and feet causing decreased feeling in them. You may not notice minor injuries to your feet that could lead to infections or more serious problems. Taking care of your feet is one of the most important things you can do for yourself.  HOME CARE INSTRUCTIONS  Wear shoes at all times, even in the house. Do not go barefoot. Bare feet are easily injured.  Check your feet daily for blisters, cuts, and redness. If you cannot see the bottom of your feet, use a mirror or ask someone for help.  Wash your feet with warm water (do not use hot water) and mild soap. Then pat your feet and the areas between your toes until they are completely dry. Do not soak your feet as this can dry your skin.  Apply a moisturizing lotion or petroleum jelly (that does not contain alcohol and is unscented) to the skin on your feet and to dry, brittle toenails. Do not apply lotion between your toes.  Trim your toenails straight across. Do not dig under them or around the cuticle. File the edges of your nails with an emery board or nail file.  Do not cut corns or calluses or try to remove them with medicine.  Wear clean socks or stockings every day. Make sure they are not too tight. Do not wear knee-high stockings since they may decrease blood flow to your legs.  Wear shoes that fit properly and have enough cushioning. To break in new shoes, wear them for just a few hours a day. This prevents you from injuring your feet. Always look in your shoes before you put them on to be sure there are no objects inside.  Do not cross your legs. This may decrease the blood flow to your feet.  If you find a minor scrape,  cut, or break in the skin on your feet, keep it and the skin around it clean and dry. These areas may be cleansed with mild soap and water. Do not cleanse the area with peroxide, alcohol, or iodine.  When you remove an adhesive bandage, be sure not to damage the skin around it.  If you have a wound, look at it several times a day to make sure it is healing.  Do not use heating pads or hot water bottles. They may burn your skin. If you have lost feeling in your feet or legs, you may not know it is happening until it is too late.  Make sure your health care provider performs a complete foot exam at least annually or more often if you have foot problems. Report any cuts, sores, or bruises to your health care provider immediately. SEEK MEDICAL CARE IF:   You have an injury that is not healing.  You have cuts or breaks in the skin.  You have an ingrown nail.  You notice redness on your legs or feet.  You feel burning or tingling in your legs or feet.  You have pain or cramps in your legs and feet.  Your legs or feet are numb.  Your feet always feel cold. SEEK IMMEDIATE MEDICAL CARE IF:   There is increasing redness,   swelling, or pain in or around a wound.  There is a red line that goes up your leg.  Pus is coming from a wound.  You develop a fever or as directed by your health care provider.  You notice a bad smell coming from an ulcer or wound. Document Released: 06/29/2000 Document Revised: 03/04/2013 Document Reviewed: 12/09/2012 [employer] Patient Information 2015 [employer]. This information is not intended to replace advice given to you by your health care provider. Make sure you discuss any questions you have with your health care provider.  

## 2014-11-17 NOTE — Progress Notes (Signed)
Patient ID: Kimberly Aguirre, female   DOB: 18-Apr-1934, 81 y.o.   MRN: [id number]  Subjective: Patient presents again complaining of painful toenails and a painful corn in the fourth right toe and request skin a nail debridement  Objective: The toenails are hypertrophic, elongated, brittle, discolored and tender to palpation 6-10 Keratoses dorsal PIPJ fourth toes right greater than left  Assessment: Symptomatic onychomycoses 6-10 Keratoses 2 Diabetes  Plan: Debrided toenails 10 keratoses 2 without any bleeding  Reappoint 3 months

## 2014-11-26 ENCOUNTER — Telehealth: Payer: Self-pay | Admitting: Internal Medicine

## 2014-11-26 ENCOUNTER — Other Ambulatory Visit: Payer: Self-pay | Admitting: Geriatric Medicine

## 2014-11-26 MED ORDER — ACCU-CHEK FASTCLIX LANCETS MISC: 1.0000 [IU] | Freq: Two times a day (BID) | Status: DC

## 2014-11-26 NOTE — Telephone Encounter (Signed)
Sent to pharmacy 

## 2014-11-26 NOTE — Telephone Encounter (Signed)
Pharmacy called regarding prescription sent over for patient. She needs the Multi Clix lancets

## 2014-11-26 NOTE — Telephone Encounter (Signed)
I sent the lancets in that were on her medication list. I left a message for patient to call me back.

## 2014-11-26 NOTE — Telephone Encounter (Signed)
Patient needs lancets for the Accu-chek multi clix. The ones she has now doesn't fit and she has not been able to check her sugar today. Pharmacy is [facility] on [address]

## 2014-11-29 NOTE — Telephone Encounter (Signed)
Patient called to follow up. Advised of below note

## 2014-12-01 ENCOUNTER — Ambulatory Visit: Payer: Medicare Other | Admitting: Podiatry

## 2014-12-03 ENCOUNTER — Telehealth: Payer: Self-pay | Admitting: Internal Medicine

## 2014-12-03 MED ORDER — GABAPENTIN 100 MG PO CAPS: 100.0000 mg | ORAL_CAPSULE | Freq: Two times a day (BID) | ORAL | Status: DC | PRN

## 2014-12-03 NOTE — Telephone Encounter (Signed)
Called pt no answer LMOM with md res

## 2014-12-03 NOTE — Telephone Encounter (Signed)
Patient's bottom of feet are burning pretty bad. She has been using some powder that does help but not at night. She brought this up with her foot doctor and he told her to ask you about it. She is hoping you can send her something in to pharmacy. [facility] on [address]

## 2014-12-03 NOTE — Telephone Encounter (Signed)
Have sent in medicine called gabapentin which may help. If not we can try something else. She can take 1 pill up to 2 times per day for the pain to start (we can change doses and how often after the first week).

## 2014-12-15 ENCOUNTER — Other Ambulatory Visit: Payer: Self-pay | Admitting: Pulmonary Disease

## 2014-12-15 ENCOUNTER — Telehealth: Payer: Self-pay | Admitting: Pulmonary Disease

## 2014-12-15 NOTE — Telephone Encounter (Signed)
Spoke with pt. Informed rx will be called in. rx called to [place] per pt request. Nothing further needed at this time.

## 2014-12-20 ENCOUNTER — Other Ambulatory Visit: Payer: Self-pay

## 2014-12-20 DIAGNOSIS — Z1231 Encounter for screening mammogram for malignant neoplasm of breast: Secondary | ICD-10-CM

## 2015-01-18 ENCOUNTER — Telehealth: Payer: Self-pay | Admitting: Internal Medicine

## 2015-01-18 ENCOUNTER — Other Ambulatory Visit: Payer: Self-pay | Admitting: Geriatric Medicine

## 2015-01-18 ENCOUNTER — Other Ambulatory Visit: Payer: Self-pay | Admitting: Pulmonary Disease

## 2015-01-18 MED ORDER — ACCU-CHEK FASTCLIX LANCETS MISC
1.0000 [IU] | Freq: Two times a day (BID) | Status: DC
Start: 2015-01-18 — End: 2015-02-22

## 2015-01-18 MED ORDER — CLORAZEPATE DIPOTASSIUM 7.5 MG PO TABS: 7.5000 mg | ORAL_TABLET | Freq: Three times a day (TID) | ORAL | Status: DC | PRN

## 2015-01-18 NOTE — Telephone Encounter (Signed)
Sent to pharmacy 

## 2015-01-18 NOTE — Telephone Encounter (Signed)
Pt called and needs refill on her clorazepate (TRANXENE) 7.5 MG tablet [[id number]].  Pt normally get 90 day supply  Also and pt said that device broke and she need  The Muli click ACCU Lancets  [facility] used [facility] on [address]

## 2015-01-24 DIAGNOSIS — H608X3 Other otitis externa, bilateral: Secondary | ICD-10-CM | POA: Diagnosis not present

## 2015-01-24 DIAGNOSIS — H6121 Impacted cerumen, right ear: Secondary | ICD-10-CM | POA: Diagnosis not present

## 2015-01-25 ENCOUNTER — Ambulatory Visit
Admission: RE | Admit: 2015-01-25 | Discharge: 2015-01-25 | Disposition: A | Discharge status: Home / Self Care | Payer: Medicare Other | Source: Ambulatory Visit

## 2015-01-25 DIAGNOSIS — Z1231 Encounter for screening mammogram for malignant neoplasm of breast: Secondary | ICD-10-CM

## 2015-02-02 ENCOUNTER — Ambulatory Visit: Payer: Medicare Other | Admitting: Podiatry

## 2015-02-22 ENCOUNTER — Encounter: Payer: Self-pay | Admitting: Podiatry

## 2015-02-22 ENCOUNTER — Ambulatory Visit (INDEPENDENT_AMBULATORY_CARE_PROVIDER_SITE_OTHER): Payer: Medicare Other | Admitting: Podiatry

## 2015-02-22 VITALS — BP 106/65 | HR 77 | Resp 12

## 2015-02-22 DIAGNOSIS — B351 Tinea unguium: Secondary | ICD-10-CM

## 2015-02-22 DIAGNOSIS — M79676 Pain in unspecified toe(s): Secondary | ICD-10-CM

## 2015-02-22 NOTE — Progress Notes (Signed)
Patient ID: Kimberly Aguirre, female   DOB: 20-Oct-1933, 81 y.o.   MRN: [id number]  Subjective: This patient presents for scheduled visit complaining of painful toenails and keratoses on the fourth toes and requests skin a nail debridement  Objective: The toenails are elongated, hypertrophic, brittle, discolored and tender to direct palpation 6-10 Corns dorsal PIPJ fourth toes bilaterally  Assessment: Symptomatic onychomycoses 6-10 Keratoses 2 (minimal) Diabetic  Plan: Debridement toenails 10 mechanically and likely without any bleeding Debrided keratoses 2 without any bleeding  Reappoint 3 months

## 2015-02-22 NOTE — Patient Instructions (Signed)
Diabetes and Foot Care Diabetes may cause you to have problems because of poor blood supply (circulation) to your feet and legs. This may cause the skin on your feet to become thinner, break easier, and heal more slowly. Your skin may become dry, and the skin may peel and crack. You may also have nerve damage in your legs and feet causing decreased feeling in them. You may not notice minor injuries to your feet that could lead to infections or more serious problems. Taking care of your feet is one of the most important things you can do for yourself.  HOME CARE INSTRUCTIONS  Wear shoes at all times, even in the house. Do not go barefoot. Bare feet are easily injured.  Check your feet daily for blisters, cuts, and redness. If you cannot see the bottom of your feet, use a mirror or ask someone for help.  Wash your feet with warm water (do not use hot water) and mild soap. Then pat your feet and the areas between your toes until they are completely dry. Do not soak your feet as this can dry your skin.  Apply a moisturizing lotion or petroleum jelly (that does not contain alcohol and is unscented) to the skin on your feet and to dry, brittle toenails. Do not apply lotion between your toes.  Trim your toenails straight across. Do not dig under them or around the cuticle. File the edges of your nails with an emery board or nail file.  Do not cut corns or calluses or try to remove them with medicine.  Wear clean socks or stockings every day. Make sure they are not too tight. Do not wear knee-high stockings since they may decrease blood flow to your legs.  Wear shoes that fit properly and have enough cushioning. To break in new shoes, wear them for just a few hours a day. This prevents you from injuring your feet. Always look in your shoes before you put them on to be sure there are no objects inside.  Do not cross your legs. This may decrease the blood flow to your feet.  If you find a minor scrape,  cut, or break in the skin on your feet, keep it and the skin around it clean and dry. These areas may be cleansed with mild soap and water. Do not cleanse the area with peroxide, alcohol, or iodine.  When you remove an adhesive bandage, be sure not to damage the skin around it.  If you have a wound, look at it several times a day to make sure it is healing.  Do not use heating pads or hot water bottles. They may burn your skin. If you have lost feeling in your feet or legs, you may not know it is happening until it is too late.  Make sure your health care provider performs a complete foot exam at least annually or more often if you have foot problems. Report any cuts, sores, or bruises to your health care provider immediately. SEEK MEDICAL CARE IF:   You have an injury that is not healing.  You have cuts or breaks in the skin.  You have an ingrown nail.  You notice redness on your legs or feet.  You feel burning or tingling in your legs or feet.  You have pain or cramps in your legs and feet.  Your legs or feet are numb.  Your feet always feel cold. SEEK IMMEDIATE MEDICAL CARE IF:   There is increasing redness,   swelling, or pain in or around a wound.  There is a red line that goes up your leg.  Pus is coming from a wound.  You develop a fever or as directed by your health care provider.  You notice a bad smell coming from an ulcer or wound. Document Released: 06/29/2000 Document Revised: 03/04/2013 Document Reviewed: 12/09/2012 [employer] Patient Information 2015 [employer]. This information is not intended to replace advice given to you by your health care provider. Make sure you discuss any questions you have with your health care provider.  

## 2015-03-05 ENCOUNTER — Encounter (HOSPITAL_COMMUNITY): Payer: Self-pay | Admitting: Emergency Medicine

## 2015-03-05 ENCOUNTER — Emergency Department (HOSPITAL_COMMUNITY): Payer: Medicare Other

## 2015-03-05 ENCOUNTER — Emergency Department (HOSPITAL_COMMUNITY)
Admission: EM | Admit: 2015-03-05 | Discharge: 2015-03-05 | Disposition: A | Discharge status: Home / Self Care | Payer: Medicare Other | Attending: Emergency Medicine | Admitting: Emergency Medicine

## 2015-03-05 DIAGNOSIS — Z8719 Personal history of other diseases of the digestive system: Secondary | ICD-10-CM | POA: Insufficient documentation

## 2015-03-05 DIAGNOSIS — Z8679 Personal history of other diseases of the circulatory system: Secondary | ICD-10-CM | POA: Diagnosis not present

## 2015-03-05 DIAGNOSIS — R51 Headache: Secondary | ICD-10-CM | POA: Insufficient documentation

## 2015-03-05 DIAGNOSIS — F419 Anxiety disorder, unspecified: Secondary | ICD-10-CM | POA: Diagnosis not present

## 2015-03-05 DIAGNOSIS — M1990 Unspecified osteoarthritis, unspecified site: Secondary | ICD-10-CM | POA: Insufficient documentation

## 2015-03-05 DIAGNOSIS — Z7982 Long term (current) use of aspirin: Secondary | ICD-10-CM | POA: Insufficient documentation

## 2015-03-05 DIAGNOSIS — Z79899 Other long term (current) drug therapy: Secondary | ICD-10-CM | POA: Diagnosis not present

## 2015-03-05 DIAGNOSIS — Z872 Personal history of diseases of the skin and subcutaneous tissue: Secondary | ICD-10-CM | POA: Diagnosis not present

## 2015-03-05 DIAGNOSIS — Z87448 Personal history of other diseases of urinary system: Secondary | ICD-10-CM | POA: Diagnosis not present

## 2015-03-05 DIAGNOSIS — H5442 Blindness, left eye, normal vision right eye: Secondary | ICD-10-CM | POA: Diagnosis not present

## 2015-03-05 DIAGNOSIS — Z86010 Personal history of colonic polyps: Secondary | ICD-10-CM | POA: Insufficient documentation

## 2015-03-05 DIAGNOSIS — I6523 Occlusion and stenosis of bilateral carotid arteries: Secondary | ICD-10-CM | POA: Diagnosis not present

## 2015-03-05 DIAGNOSIS — E119 Type 2 diabetes mellitus without complications: Secondary | ICD-10-CM | POA: Insufficient documentation

## 2015-03-05 DIAGNOSIS — R519 Headache, unspecified: Secondary | ICD-10-CM

## 2015-03-05 DIAGNOSIS — R42 Dizziness and giddiness: Secondary | ICD-10-CM | POA: Diagnosis not present

## 2015-03-05 LAB — I-STAT CHEM 8, ED
BUN: 13 mg/dL (ref 6–20)
CREATININE: 0.7 mg/dL (ref 0.44–1.00)
Calcium, Ion: 1.17 mmol/L (ref 1.13–1.30)
Chloride: 102 mmol/L (ref 101–111)
Glucose, Bld: 109 mg/dL — ABNORMAL HIGH (ref 65–99)
HEMATOCRIT: 45 % (ref 36.0–46.0)
Hemoglobin: 15.3 g/dL — ABNORMAL HIGH (ref 12.0–15.0)
Potassium: 4 mmol/L (ref 3.5–5.1)
Sodium: 140 mmol/L (ref 135–145)
TCO2: 25 mmol/L (ref 0–100)

## 2015-03-05 LAB — SEDIMENTATION RATE: Sed Rate: 28 mm/hr — ABNORMAL HIGH (ref 0–22)

## 2015-03-05 MED ORDER — DIPHENHYDRAMINE HCL 50 MG/ML IJ SOLN
12.5000 mg | Freq: Once | INTRAMUSCULAR | Status: AC
  Administered 2015-03-05: 12.5 mg via INTRAVENOUS
  Filled 2015-03-05: qty 1

## 2015-03-05 MED ORDER — IOHEXOL 350 MG/ML SOLN
50.0000 mL | Freq: Once | INTRAVENOUS | Status: AC | PRN
  Administered 2015-03-05: 50 mL via INTRAVENOUS

## 2015-03-05 MED ORDER — TIZANIDINE HCL 2 MG PO CAPS: 2.0000 mg | ORAL_CAPSULE | Freq: Three times a day (TID) | ORAL | Status: DC | PRN

## 2015-03-05 MED ORDER — METOCLOPRAMIDE HCL 5 MG/ML IJ SOLN
10.0000 mg | Freq: Once | INTRAMUSCULAR | Status: AC
  Administered 2015-03-05: 10 mg via INTRAVENOUS
  Filled 2015-03-05: qty 2

## 2015-03-05 NOTE — Discharge Instructions (Signed)
Please follow up with your primary care physician in 1-2 days. If you do not have one please call the [facility] number listed above. Please read all discharge instructions and return precautions.   General Headache Without Cause A headache is pain or discomfort felt around the head or neck area. The specific cause of a headache may not be found. There are many causes and types of headaches. A few common ones are:  Tension headaches.  Migraine headaches.  Cluster headaches.  Chronic daily headaches. HOME CARE INSTRUCTIONS   Keep all follow-up appointments with your caregiver or any specialist referral.  Only take over-the-counter or prescription medicines for pain or discomfort as directed by your caregiver.  Lie down in a dark, quiet room when you have a headache.  Keep a headache journal to find out what may trigger your migraine headaches. For example, write down:  What you eat and drink.  How much sleep you get.  Any change to your diet or medicines.  Try massage or other relaxation techniques.  Put ice packs or heat on the head and neck. Use these 3 to 4 times per day for 15 to 20 minutes each time, or as needed.  Limit stress.  Sit up straight, and do not tense your muscles.  Quit smoking if you smoke.  Limit alcohol use.  Decrease the amount of caffeine you drink, or stop drinking caffeine.  Eat and sleep on a regular schedule.  Get 7 to 9 hours of sleep, or as recommended by your caregiver.  Keep lights dim if bright lights bother you and make your headaches worse. SEEK MEDICAL CARE IF:   You have problems with the medicines you were prescribed.  Your medicines are not working.  You have a change from the usual headache.  You have nausea or vomiting. SEEK IMMEDIATE MEDICAL CARE IF:   Your headache becomes severe.  You have a fever.  You have a stiff neck.  You have loss of vision.  You have muscular weakness or loss of  muscle control.  You start losing your balance or have trouble walking.  You feel faint or pass out.  You have severe symptoms that are different from your first symptoms. MAKE SURE YOU:   Understand these instructions.  Will watch your condition.  Will get help right away if you are not doing well or get worse. Document Released: 07/02/2005 Document Revised: 09/24/2011 Document Reviewed: 07/18/2011 [facility] Patient Information 2015 [place], [place]. This information is not intended to replace advice given to you by your health care provider. Make sure you discuss any questions you have with your health care provider.

## 2015-03-05 NOTE — ED Provider Notes (Signed)
CSN: [id number]     Arrival date & time 03/05/15  1114 History   First MD Initiated Contact with Patient 03/05/15 1137     Chief Complaint  Patient presents with  . Headache     (Consider location/radiation/quality/duration/timing/severity/associated sxs/prior Treatment) HPI Comments: Patient is a 81 yo F PMHx significant for DM presenting to the ED for evaluation of 5 days of right sided posterior throbbing headache that has been intermittent. Patient states she has tried [employer] with temporary relief of her headache, but it returns. Denies any new or changes in visual disturbance, nausea, vomiting, extremity weakness or numbness. Patient denies any falls or injuries prior to the onset of headache.   Patient is a 81 y.o. female presenting with headaches.  Headache Radiates to:  Does not radiate Severity currently:  7/10 Onset quality:  Gradual Duration:  5 days Timing:  Intermittent Chronicity:  New Context: not straining   Relieved by: Goody Powders. Worsened by:  Nothing Associated symptoms: no abdominal pain, no back pain, no blurred vision, no dizziness, no fever, no focal weakness, no loss of balance, no nausea, no photophobia, no seizures, no syncope, no tingling and no weakness     Past Medical History  Diagnosis Date  . Blindness of left eye   . Chest pain, atypical   . Venous insufficiency   . Diabetes mellitus   . Hiatal hernia   . IBS (irritable bowel syndrome)   . Hx of colonic polyps   . Overactive bladder   . DJD (degenerative joint disease)   . Osteopenia   . Headache(784.0)   . Anxiety   . Dermatitis    Past Surgical History  Procedure Laterality Date  . Eye surgery      age 45  . Vaginal hysterectomy      and ureteral sling 06/2010 by Dr. Cristine Polio   Family History  Problem Relation Age of Onset  . Diabetes Mother   . Diabetes Father   . CAD Neg Hx    Social History  Substance Use Topics  . Smoking status: Never Smoker   .  Smokeless tobacco: None  . Alcohol Use: No   OB History    No data available     Review of Systems  Constitutional: Negative for fever.  Eyes: Negative for blurred vision and photophobia.  Cardiovascular: Negative for syncope.  Gastrointestinal: Negative for nausea and abdominal pain.  Musculoskeletal: Negative for back pain.  Neurological: Positive for headaches. Negative for dizziness, focal weakness, seizures, weakness and loss of balance.  All other systems reviewed and are negative.     Allergies  Codeine  Home Medications   Prior to Admission medications   Medication Sig Start Date End Date Taking? Authorizing Provider  aspirin 81 MG tablet Take 81 mg by mouth daily.      Historical Provider, MD  Blood Glucose Monitoring Suppl (ACCU-CHEK AVIVA PLUS) W/DEVICE KIT 1 Device by Does not apply route as directed. 03/22/11   Noralee Space, MD  calcium-vitamin D (OSCAL WITH D) 500-200 MG-UNIT per tablet Take 1 tablet by mouth 2 times per week    Historical Provider, MD  clorazepate (TRANXENE) 7.5 MG tablet Take 1 tablet (7.5 mg total) by mouth 3 (three) times daily as needed. 01/18/15   Olga Millers, MD  fish oil-omega-3 fatty acids 1000 MG capsule Take 1 g by mouth daily.      Historical Provider, MD  gabapentin (NEURONTIN) 100 MG capsule Take 1 capsule (100  mg total) by mouth 2 (two) times daily as needed. 12/03/14   Olga Millers, MD  glimepiride (AMARYL) 4 MG tablet take 1/2  tablet once daily every morning 05/05/12   Noralee Space, MD  glucose blood (ACCU-CHEK AVIVA PLUS) test strip 1 each by Other route 2 (two) times daily. Use to check blood sugars twice a day Dx E11.9 07/05/14   Olga Millers, MD  Lancets (ACCU-CHEK MULTICLIX) lancets  01/18/15   Historical Provider, MD  meloxicam (MOBIC) 7.5 MG tablet take 1 tablet by mouth once daily if needed for ARTHRITIS PAIN 05/01/13   Noralee Space, MD  metFORMIN (GLUCOPHAGE) 500 MG tablet take 1 tablet by mouth twice a day  04/27/14   Noralee Space, MD  mometasone (ELOCON) 0.1 % cream APPLY TO EXTERNAL EAR DAILY AS NEEDED 01/25/15   Historical Provider, MD  Multiple Vitamins-Minerals (CENTRUM SILVER PO) Take 1 tablet by mouth daily.      Historical Provider, MD  tizanidine (ZANAFLEX) 2 MG capsule Take 1 capsule (2 mg total) by mouth 3 (three) times daily as needed (headaches). 03/05/15   Corlette Ciano, PA-C   BP 143/96 mmHg  Pulse 69  Temp(Src) 98 F (36.7 C) (Oral)  Resp 20  Ht 5' 1"  (1.549 m)  Wt 119 lb (53.978 kg)  BMI 22.50 kg/m2  SpO2 98% Physical Exam  Constitutional: She is oriented to person, place, and time. She appears well-developed and well-nourished. No distress.  HENT:  Head: Normocephalic and atraumatic.  Right Ear: External ear normal.  Left Ear: External ear normal.  Nose: Nose normal.  Mouth/Throat: Oropharynx is clear and moist. No oropharyngeal exudate.  Eyes: Conjunctivae and EOM are normal. Pupils are equal, round, and reactive to light.  Neck: Normal range of motion. Neck supple.  Cardiovascular: Normal rate, regular rhythm, normal heart sounds and intact distal pulses.   Pulmonary/Chest: Effort normal and breath sounds normal. No respiratory distress.  Abdominal: Soft. There is no tenderness.  Neurological: She is alert and oriented to person, place, and time. She has normal strength. No cranial nerve deficit. Gait normal. GCS eye subscore is 4. GCS verbal subscore is 5. GCS motor subscore is 6.  Sensation grossly intact.  No pronator drift.  Bilateral heel-knee-shin intact. Bilateral finger-nose-finger intact.   Skin: Skin is warm and dry. She is not diaphoretic.  Nursing note and vitals reviewed.   ED Course  Procedures (including critical care time) Medications  metoCLOPramide (REGLAN) injection 10 mg (10 mg Intravenous Given 03/05/15 1247)  diphenhydrAMINE (BENADRYL) injection 12.5 mg (12.5 mg Intravenous Given 03/05/15 1247)  iohexol (OMNIPAQUE) 350 MG/ML  injection 50 mL (50 mLs Intravenous Contrast Given 03/05/15 1333)    Labs Review Labs Reviewed  SEDIMENTATION RATE - Abnormal; Notable for the following:    Sed Rate 28 (*)    All other components within normal limits  I-STAT CHEM 8, ED - Abnormal; Notable for the following:    Glucose, Bld 109 (*)    Hemoglobin 15.3 (*)    All other components within normal limits    Imaging Review Ct Angio Head W/cm &/or Wo Cm  03/05/2015   CLINICAL DATA:  Headache beginning 4-5 days ago. Severe headache with throbbing. Loss of vision in the left thigh.  EXAM: CT ANGIOGRAPHY HEAD  TECHNIQUE: Multidetector CT imaging of the head was performed using the standard protocol during bolus administration of intravenous contrast. Multiplanar CT image reconstructions and MIPs were obtained to evaluate the vascular anatomy.  CONTRAST:  41m OMNIPAQUE IOHEXOL 350 MG/ML SOLN  COMPARISON:  None.  FINDINGS: CT HEAD  The brain shows mild age related atrophy. No focal abnormality is seen affecting the brainstem or cerebellum. Within the cerebral hemispheres, there are chronic small-vessel ischemic changes affecting the white matter. No sign of acute infarction. No cortical or large vessel territory insult. No mass lesion, hemorrhage, hydrocephalus or extra-axial collection. No abnormal contrast enhancement occurs. Orbital structures appear normal.  CTA HEAD  Anterior circulation: Both internal carotid arteries are widely patent through the skullbase. There is atherosclerotic calcification in the carotid siphon regions but no stenosis greater than 30%. Supra clinoid internal carotid arteries are widely patent. The anterior and middle cerebral vessels are widely patent without proximal stenosis, aneurysm or vascular malformation.  Posterior circulation: Both vertebral arteries are approximately equal in size an widely patent through the foramen magnum to the basilar. No basilar stenosis. Posterior circulation branch vessels are  patent. No aneurysm.  Venous sinuses: Patent and normal  Anatomic variants: None significant  IMPRESSION: Head CT: No acute intracranial finding. Chronic small-vessel ischemic changes of the white matter.  CTA head: No large or medium vessel occlusion or flow-limiting stenosis. Atherosclerotic disease in the carotid siphon regions but without stenosis greater than 30%.   Electronically Signed   By: MNelson ChimesM.D.   On: 03/05/2015 14:13   Ct Head Wo Contrast  03/05/2015   CLINICAL DATA:  Headache beginning 4-5 days ago. Severe headache with throbbing. Loss of vision in the left thigh.  EXAM: CT ANGIOGRAPHY HEAD  TECHNIQUE: Multidetector CT imaging of the head was performed using the standard protocol during bolus administration of intravenous contrast. Multiplanar CT image reconstructions and MIPs were obtained to evaluate the vascular anatomy.  CONTRAST:  528mOMNIPAQUE IOHEXOL 350 MG/ML SOLN  COMPARISON:  None.  FINDINGS: CT HEAD  The brain shows mild age related atrophy. No focal abnormality is seen affecting the brainstem or cerebellum. Within the cerebral hemispheres, there are chronic small-vessel ischemic changes affecting the white matter. No sign of acute infarction. No cortical or large vessel territory insult. No mass lesion, hemorrhage, hydrocephalus or extra-axial collection. No abnormal contrast enhancement occurs. Orbital structures appear normal.  CTA HEAD  Anterior circulation: Both internal carotid arteries are widely patent through the skullbase. There is atherosclerotic calcification in the carotid siphon regions but no stenosis greater than 30%. Supra clinoid internal carotid arteries are widely patent. The anterior and middle cerebral vessels are widely patent without proximal stenosis, aneurysm or vascular malformation.  Posterior circulation: Both vertebral arteries are approximately equal in size an widely patent through the foramen magnum to the basilar. No basilar stenosis. Posterior  circulation branch vessels are patent. No aneurysm.  Venous sinuses: Patent and normal  Anatomic variants: None significant  IMPRESSION: Head CT: No acute intracranial finding. Chronic small-vessel ischemic changes of the white matter.  CTA head: No large or medium vessel occlusion or flow-limiting stenosis. Atherosclerotic disease in the carotid siphon regions but without stenosis greater than 30%.   Electronically Signed   By: MaNelson Chimes.D.   On: 03/05/2015 14:13   I have personally reviewed and evaluated these images and lab results as part of my medical decision-making.   EKG Interpretation None      MDM   Final diagnoses:  Headache    Filed Vitals:   03/05/15 1445  BP: 143/96  Pulse: 69  Temp:   Resp:    Afebrile, NAD, non-toxic appearing, AAOx4.  Pt HA treated and improved while in ED.  Presentation is non concerning for [facility], ICH, Meningitis, or temporal arteritis. CT head and CT angio reviewed without acute abnormalities. Pt is afebrile with no focal neuro deficits, nuchal rigidity, or change in vision. Pt is to follow up with PCP to discuss prophylactic medication. Pt verbalizes understanding and is agreeable with plan to dc. Patient is stable at time of discharge. Patient d/w with Dr. Wyvonnia Dusky, agrees with plan.       Baron Sane, PA-C 03/06/15 2003  Ezequiel Essex, MD 03/06/15 [phone]

## 2015-03-05 NOTE — ED Notes (Signed)
Checked patient eyes patient dont see anything out of the left eye both eyes 20/20 with glasses, 20/20 out of the right eye

## 2015-03-05 NOTE — ED Notes (Signed)
Onset 4-5 days ago headache constant 7/10 throbbing. Alert answering and following commands appropriate.

## 2015-03-15 ENCOUNTER — Encounter: Payer: Self-pay | Admitting: Internal Medicine

## 2015-03-15 ENCOUNTER — Other Ambulatory Visit (INDEPENDENT_AMBULATORY_CARE_PROVIDER_SITE_OTHER): Payer: Medicare Other

## 2015-03-15 ENCOUNTER — Ambulatory Visit (INDEPENDENT_AMBULATORY_CARE_PROVIDER_SITE_OTHER): Payer: Medicare Other | Admitting: Internal Medicine

## 2015-03-15 VITALS — BP 132/68 | HR 78 | Temp 98.5°F | Resp 14 | Ht 60.0 in | Wt 120.0 lb

## 2015-03-15 DIAGNOSIS — E119 Type 2 diabetes mellitus without complications: Secondary | ICD-10-CM | POA: Diagnosis not present

## 2015-03-15 DIAGNOSIS — I10 Essential (primary) hypertension: Secondary | ICD-10-CM

## 2015-03-15 DIAGNOSIS — E669 Obesity, unspecified: Secondary | ICD-10-CM

## 2015-03-15 DIAGNOSIS — E785 Hyperlipidemia, unspecified: Secondary | ICD-10-CM

## 2015-03-15 DIAGNOSIS — E1140 Type 2 diabetes mellitus with diabetic neuropathy, unspecified: Secondary | ICD-10-CM

## 2015-03-15 DIAGNOSIS — E1169 Type 2 diabetes mellitus with other specified complication: Secondary | ICD-10-CM

## 2015-03-15 DIAGNOSIS — R519 Headache, unspecified: Secondary | ICD-10-CM

## 2015-03-15 DIAGNOSIS — R51 Headache: Secondary | ICD-10-CM

## 2015-03-15 LAB — COMPREHENSIVE METABOLIC PANEL
ALT: 16 U/L (ref 0–35)
AST: 19 U/L (ref 0–37)
Albumin: 4.3 g/dL (ref 3.5–5.2)
Alkaline Phosphatase: 81 U/L (ref 39–117)
BILIRUBIN TOTAL: 0.4 mg/dL (ref 0.2–1.2)
BUN: 13 mg/dL (ref 6–23)
CALCIUM: 10.2 mg/dL (ref 8.4–10.5)
CHLORIDE: 100 meq/L (ref 96–112)
CO2: 35 meq/L — AB (ref 19–32)
CREATININE: 0.82 mg/dL (ref 0.40–1.20)
GFR: 86.39 mL/min (ref >60.00)
Glucose, Bld: 166 mg/dL — ABNORMAL HIGH (ref 70–99)
Potassium: 4.3 mEq/L (ref 3.5–5.1)
SODIUM: 139 meq/L (ref 135–145)
Total Protein: 8.2 g/dL (ref 6.0–8.3)

## 2015-03-15 LAB — LIPID PANEL
CHOL/HDL RATIO: 5
Cholesterol: 220 mg/dL — ABNORMAL HIGH (ref 0–200)
HDL: 48.5 mg/dL (ref >39.00)
LDL CALC: 134 mg/dL — AB (ref 0–99)
NONHDL: 171.58
TRIGLYCERIDES: 188 mg/dL — AB (ref 0.0–149.0)
VLDL: 37.6 mg/dL (ref 0.0–40.0)

## 2015-03-15 LAB — HEMOGLOBIN A1C: Hgb A1c MFr Bld: 7.3 % — ABNORMAL HIGH (ref 4.6–6.5)

## 2015-03-15 MED ORDER — CLORAZEPATE DIPOTASSIUM 7.5 MG PO TABS: 7.5000 mg | ORAL_TABLET | Freq: Every day | ORAL | Status: DC | PRN

## 2015-03-15 NOTE — Assessment & Plan Note (Signed)
Her headache is resolving gradually. Possibly some musculoskeletal component given no acute changes on CTA head and ESR negative to rule out temporal arteritis.

## 2015-03-15 NOTE — Progress Notes (Signed)
   Subjective:    Patient ID: Kimberly Aguirre, female    DOB: 1933-09-17, 81 y.o.   MRN: [id number]  HPI The patient is coming in for a ER follow up (presented with headache and ESR negative and CTA head without stroke or significant cranial blockages). Her headache is resolving but still mild. She does feel somewhat weak and has been eating less since the headache. She is not exercising. Denies falls. Eats some fast food and feels bad because she knows she should not eat stuff like that.   Review of Systems  Constitutional: Negative for fever, activity change, appetite change, fatigue and unexpected weight change.  Respiratory: Negative for cough, chest tightness, shortness of breath and wheezing.   Cardiovascular: Negative for chest pain, palpitations and leg swelling.  Gastrointestinal: Negative for nausea, abdominal pain, diarrhea, constipation and abdominal distention.  Musculoskeletal: Positive for arthralgias.  Skin: Negative.   Neurological: Positive for headaches. Negative for weakness and numbness.       Some burning in her feet at night time.      Objective:   Physical Exam  Constitutional: She is oriented to person, place, and time. She appears well-developed and well-nourished.  HENT:  Head: Normocephalic and atraumatic.  Eyes: EOM are normal.  Neck: Normal range of motion.  Cardiovascular: Normal rate and regular rhythm.   Carotids without murmur.  Pulmonary/Chest: Effort normal and breath sounds normal. No respiratory distress. She has no wheezes. She has no rales.  Abdominal: Soft. Bowel sounds are normal.  Musculoskeletal: She exhibits no edema.  Neurological: She is alert and oriented to person, place, and time. Coordination normal.  Skin: Skin is warm and dry.   Filed Vitals:   03/15/15 1045  BP: 132/68  Pulse: 78  Temp: 98.5 F (36.9 C)  TempSrc: Oral  Resp: 14  Height: 5' (1.524 m)  Weight: 120 lb (54.432 kg)  SpO2: 98%      Assessment & Plan:

## 2015-03-15 NOTE — Assessment & Plan Note (Signed)
Checking lipid panel today, currently on no medication. Adjust as needed.

## 2015-03-15 NOTE — Patient Instructions (Signed)
We have given you a copy of the report from the imaging of the brain done at the ER.   We will check your blood work today for the diabetes and call you back with the results.   Overall you are doing well so keep up the good work.

## 2015-03-15 NOTE — Progress Notes (Signed)
Pre visit review using our clinic review tool, if applicable. No additional management support is needed unless otherwise documented below in the visit note. 

## 2015-03-15 NOTE — Assessment & Plan Note (Addendum)
Checking HgA1c, currently on glimepiride and metformin. Denies any hypoglycemia. Reminded about need for yearly eye exam. Adjust regimen as needed. Goal <7.5

## 2015-03-15 NOTE — Assessment & Plan Note (Signed)
BP at goal off medication. Checking CMP today.

## 2015-03-18 ENCOUNTER — Telehealth: Payer: Self-pay | Admitting: Internal Medicine

## 2015-03-18 NOTE — Telephone Encounter (Signed)
She is only needing 30 per month which means that she is using daily prn. No change in quantity.

## 2015-03-18 NOTE — Telephone Encounter (Signed)
Patient wants to know if this can be sent in a 90/day supply.

## 2015-03-18 NOTE — Telephone Encounter (Signed)
Patient is seeking clarification on her clorazepate (TRANXENE) 7.5 MG tablet [[id number]] script. The sig changed from take 3x/day PRN to 1x/day PRN. She is worried about this. Please give her a call asap

## 2015-03-18 NOTE — Telephone Encounter (Signed)
Did you change the sig during her last visit? I didn't see it in your last note.

## 2015-04-04 NOTE — Telephone Encounter (Signed)
Yes, but remind me next month when we refill.

## 2015-04-28 ENCOUNTER — Ambulatory Visit (INDEPENDENT_AMBULATORY_CARE_PROVIDER_SITE_OTHER): Payer: Medicare Other | Admitting: *Deleted

## 2015-04-28 DIAGNOSIS — Z23 Encounter for immunization: Secondary | ICD-10-CM

## 2015-05-20 ENCOUNTER — Other Ambulatory Visit: Payer: Self-pay | Admitting: Pulmonary Disease

## 2015-05-20 ENCOUNTER — Telehealth: Payer: Self-pay | Admitting: Internal Medicine

## 2015-05-20 ENCOUNTER — Other Ambulatory Visit: Payer: Self-pay | Admitting: Geriatric Medicine

## 2015-05-20 MED ORDER — GLUCOSE BLOOD VI STRP: 1.0000 | ORAL_STRIP | Freq: Two times a day (BID) | Status: DC

## 2015-05-20 NOTE — Telephone Encounter (Signed)
Patient states she did not receive a 3 month supply of tranxene.  Please follow up with patient

## 2015-05-20 NOTE — Telephone Encounter (Signed)
We gave her a 3 month supply of the tranxene on 03/15/15 so she is not due yet for refill. Strips sent in.

## 2015-05-20 NOTE — Telephone Encounter (Signed)
Patient called back.  She states she is out of strips.  She also states she needs 90 day supply of tranxene.

## 2015-05-20 NOTE — Telephone Encounter (Signed)
Patient requesting refill for glucose blood (ACCU-CHEK AVIVA PLUS) test strip [[id number]]  She is out of these  She also states that she usually takes clorazepate (TRANXENE) 7.5 MG tablet [[id number] 2 times a day and sometimes 3 times a day and was hoping to get a prescription with directions to take 3 times a day, so if she needs them she will have them.  Pharmacy is [facility] on [address]  Please call patient

## 2015-05-23 ENCOUNTER — Telehealth: Payer: Self-pay | Admitting: Podiatry

## 2015-05-23 NOTE — Telephone Encounter (Signed)
Left vm for pt.  To call office back.

## 2015-05-24 ENCOUNTER — Ambulatory Visit (INDEPENDENT_AMBULATORY_CARE_PROVIDER_SITE_OTHER): Payer: Medicare Other | Admitting: Podiatry

## 2015-05-24 DIAGNOSIS — M79676 Pain in unspecified toe(s): Secondary | ICD-10-CM

## 2015-05-24 DIAGNOSIS — B351 Tinea unguium: Secondary | ICD-10-CM | POA: Diagnosis not present

## 2015-05-24 NOTE — Progress Notes (Deleted)
Patient ID: Kimberly Aguirre, female   DOB: 08/16/1933, 81 y.o.   MRN: [id number]  Subjective: This patient presents for scheduled visit complaining of painful toenails and keratoses on the fourth toes and requests skin a nail debridement  Objective: The toenails are elongated, hypertrophic, brittle, discolored and tender to direct palpation 6-10 Corns dorsal PIPJ fourth toes bilaterally  Assessment: Symptomatic onychomycoses 6-10 Keratoses 2 (minimal) Diabetic  Plan: Debridement toenails 10 mechanically and likely without any bleeding Debrided keratoses 2 without any bleeding  Reappoint 3 months 

## 2015-05-24 NOTE — Telephone Encounter (Signed)
Left message for patient to call back  

## 2015-05-24 NOTE — Progress Notes (Signed)
HPI Presents today chief complaint of painful elongated toenails.  Objective: Pulses are palpable bilateral nails are thick, yellow dystrophic onychomycosis and painful palpation.   Assessment: Onychomycosis with pain in limb.  Plan: Treatment of nails in thickness and length as covered service secondary to pain.  

## 2015-06-03 ENCOUNTER — Other Ambulatory Visit: Payer: Self-pay | Admitting: Internal Medicine

## 2015-06-03 ENCOUNTER — Other Ambulatory Visit: Payer: Self-pay | Admitting: Pulmonary Disease

## 2015-06-15 ENCOUNTER — Encounter: Payer: Self-pay | Admitting: Internal Medicine

## 2015-06-15 ENCOUNTER — Ambulatory Visit (INDEPENDENT_AMBULATORY_CARE_PROVIDER_SITE_OTHER): Payer: Medicare Other | Admitting: Internal Medicine

## 2015-06-15 VITALS — BP 118/62 | HR 87 | Temp 98.5°F | Resp 16 | Ht 61.0 in | Wt 123.0 lb

## 2015-06-15 DIAGNOSIS — Z23 Encounter for immunization: Secondary | ICD-10-CM | POA: Diagnosis not present

## 2015-06-15 DIAGNOSIS — Z0000 Encounter for general adult medical examination without abnormal findings: Secondary | ICD-10-CM

## 2015-06-15 DIAGNOSIS — E118 Type 2 diabetes mellitus with unspecified complications: Secondary | ICD-10-CM

## 2015-06-15 MED ORDER — CLORAZEPATE DIPOTASSIUM 7.5 MG PO TABS: 7.5000 mg | ORAL_TABLET | Freq: Every day | ORAL | Status: DC | PRN

## 2015-06-15 NOTE — Progress Notes (Signed)
Pre visit review using our clinic review tool, if applicable. No additional management support is needed unless otherwise documented below in the visit note. 

## 2015-06-15 NOTE — Assessment & Plan Note (Signed)
Checking labs today, prevnar given to complete pneumonia series. Flu shot done, declines tdap. Counseled on exercise and balance training. 10 year screening recommendations given. Colonoscopy up to date. Aged out of some screening.

## 2015-06-15 NOTE — Progress Notes (Signed)
   Subjective:    Patient ID: Kimberly Aguirre, female    DOB: [birth year], 81 y.o.   MRN: [id number]  HPI Here for medicare wellness, no new complaints. Please see A/P for status and treatment of chronic medical problems.   Diet: DM since diabetic Physical activity: sedentary Depression/mood screen: negative Hearing: intact to whispered voice, some mild loss Visual acuity: left no vision, right with some loss, performs annual eye exam  ADLs: capable Fall risk: none Home safety: good Cognitive evaluation: intact to orientation, naming, recall and repetition EOL planning: adv directives discussed,   I have personally reviewed and have noted 1. The patient's medical and social history - reviewed today no changes 2. Their use of alcohol, tobacco or illicit drugs 3. Their current medications and supplements 4. The patient's functional ability including ADL's, fall risks, home safety risks and hearing or visual impairment. 5. Diet and physical activities 6. Evidence for depression or mood disorders 7. Care team reviewed and updated (available in snapshot)  Review of Systems  Constitutional: Negative for fever, activity change, appetite change, fatigue and unexpected weight change.  HENT: Negative.   Respiratory: Negative for cough, chest tightness, shortness of breath and wheezing.   Cardiovascular: Negative for chest pain, palpitations and leg swelling.  Gastrointestinal: Negative for nausea, abdominal pain, diarrhea, constipation and abdominal distention.  Musculoskeletal: Positive for arthralgias. Negative for myalgias and back pain.  Skin: Negative.   Neurological: Negative for weakness, numbness and headaches.       Some burning in her feet at night time.  Psychiatric/Behavioral: Negative.       Objective:   Physical Exam  Constitutional: She is oriented to person, place, and time. She appears well-developed and well-nourished.  HENT:  Head: Normocephalic and atraumatic.    Eyes: EOM are normal.  Neck: Normal range of motion.  Cardiovascular: Normal rate and regular rhythm.   Carotids without murmur.  Pulmonary/Chest: Effort normal and breath sounds normal. No respiratory distress. She has no wheezes. She has no rales.  Abdominal: Soft. Bowel sounds are normal.  Musculoskeletal: She exhibits no edema.  Neurological: She is alert and oriented to person, place, and time. Coordination normal.  Skin: Skin is warm and dry.   Filed Vitals:   06/15/15 0930  BP: 118/62  Pulse: 87  Temp: 98.5 F (36.9 C)  TempSrc: Oral  Resp: 16  Height: 5\' 1"  (1.549 m)  Weight: 123 lb (55.792 kg)  SpO2: 97%      Assessment & Plan:  Prevnar 13 given at visit.

## 2015-06-15 NOTE — Patient Instructions (Signed)
We have given you the pneumonia booster shot today.   You can check the sugars once a day and it is okay to skip days if needed. You should be checking at least 2-3 times per week.   Keep up the good work with being active.   Health Maintenance, Female Adopting a healthy lifestyle and getting preventive care can go a long way to promote health and wellness. Talk with your health care provider about what schedule of regular examinations is right for you. This is a good chance for you to check in with your provider about disease prevention and staying healthy. In between checkups, there are plenty of things you can do on your own. Experts have done a lot of research about which lifestyle changes and preventive measures are most likely to keep you healthy. Ask your health care provider for more information. WEIGHT AND DIET  Eat a healthy diet  Be sure to include plenty of vegetables, fruits, low-fat dairy products, and lean protein.  Do not eat a lot of foods high in solid fats, added sugars, or salt.  Get regular exercise. This is one of the most important things you can do for your health.  Most adults should exercise for at least 150 minutes each week. The exercise should increase your heart rate and make you sweat (moderate-intensity exercise).  Most adults should also do strengthening exercises at least twice a week. This is in addition to the moderate-intensity exercise.  Maintain a healthy weight  Body mass index (BMI) is a measurement that can be used to identify possible weight problems. It estimates body fat based on height and weight. Your health care provider can help determine your BMI and help you achieve or maintain a healthy weight.  For females 9 years of age and older:   A BMI below 18.5 is considered underweight.  A BMI of 18.5 to 24.9 is normal.  A BMI of 25 to 29.9 is considered overweight.  A BMI of 30 and above is considered obese.  Watch levels of  cholesterol and blood lipids  You should start having your blood tested for lipids and cholesterol at 79 years of age, then have this test every 5 years.  You may need to have your cholesterol levels checked more often if:  Your lipid or cholesterol levels are high.  You are older than 79 years of age.  You are at high risk for heart disease.  CANCER SCREENING   Lung Cancer  Lung cancer screening is recommended for adults 82-26 years old who are at high risk for lung cancer because of a history of smoking.  A yearly low-dose CT scan of the lungs is recommended for people who:  Currently smoke.  Have quit within the past 15 years.  Have at least a 30-pack-year history of smoking. A pack year is smoking an average of one pack of cigarettes a day for 1 year.  Yearly screening should continue until it has been 15 years since you quit.  Yearly screening should stop if you develop a health problem that would prevent you from having lung cancer treatment.  Breast Cancer  Practice breast self-awareness. This means understanding how your breasts normally appear and feel.  It also means doing regular breast self-exams. Let your health care provider know about any changes, no matter how small.  If you are in your 20s or 30s, you should have a clinical breast exam (CBE) by a health care provider every 1-3 years  as part of a regular health exam.  If you are 59 or older, have a CBE every year. Also consider having a breast X-ray (mammogram) every year.  If you have a family history of breast cancer, talk to your health care provider about genetic screening.  If you are at high risk for breast cancer, talk to your health care provider about having an MRI and a mammogram every year.  Breast cancer gene (BRCA) assessment is recommended for women who have family members with BRCA-related cancers. BRCA-related cancers include:  Breast.  Ovarian.  Tubal.  Peritoneal  cancers.  Results of the assessment will determine the need for genetic counseling and BRCA1 and BRCA2 testing. Cervical Cancer Your health care provider may recommend that you be screened regularly for cancer of the pelvic organs (ovaries, uterus, and vagina). This screening involves a pelvic examination, including checking for microscopic changes to the surface of your cervix (Pap test). You may be encouraged to have this screening done every 3 years, beginning at age 41.  For women ages 13-65, health care providers may recommend pelvic exams and Pap testing every 3 years, or they may recommend the Pap and pelvic exam, combined with testing for human papilloma virus (HPV), every 5 years. Some types of HPV increase your risk of cervical cancer. Testing for HPV may also be done on women of any age with unclear Pap test results.  Other health care providers may not recommend any screening for nonpregnant women who are considered low risk for pelvic cancer and who do not have symptoms. Ask your health care provider if a screening pelvic exam is right for you.  If you have had past treatment for cervical cancer or a condition that could lead to cancer, you need Pap tests and screening for cancer for at least 20 years after your treatment. If Pap tests have been discontinued, your risk factors (such as having a new sexual partner) need to be reassessed to determine if screening should resume. Some women have medical problems that increase the chance of getting cervical cancer. In these cases, your health care provider may recommend more frequent screening and Pap tests. Colorectal Cancer  This type of cancer can be detected and often prevented.  Routine colorectal cancer screening usually begins at 79 years of age and continues through 79 years of age.  Your health care provider may recommend screening at an earlier age if you have risk factors for colon cancer.  Your health care provider may also  recommend using home test kits to check for hidden blood in the stool.  A small camera at the end of a tube can be used to examine your colon directly (sigmoidoscopy or colonoscopy). This is done to check for the earliest forms of colorectal cancer.  Routine screening usually begins at age 9.  Direct examination of the colon should be repeated every 5-10 years through 79 years of age. However, you may need to be screened more often if early forms of precancerous polyps or small growths are found. Skin Cancer  Check your skin from head to toe regularly.  Tell your health care provider about any new moles or changes in moles, especially if there is a change in a mole's shape or color.  Also tell your health care provider if you have a mole that is larger than the size of a pencil eraser.  Always use sunscreen. Apply sunscreen liberally and repeatedly throughout the [place].  Protect yourself by wearing long sleeves,  pants, a wide-brimmed hat, and sunglasses whenever you are outside. HEART DISEASE, DIABETES, AND HIGH BLOOD PRESSURE   High blood pressure causes heart disease and increases the risk of stroke. High blood pressure is more likely to develop in:  People who have blood pressure in the high end of the normal range (130-139/85-89 mm Hg).  People who are overweight or obese.  People who are African American.  If you are 22-60 years of age, have your blood pressure checked every 3-5 years. If you are 61 years of age or older, have your blood pressure checked every year. You should have your blood pressure measured twice--once when you are at a hospital or clinic, and once when you are not at a hospital or clinic. Record the average of the two measurements. To check your blood pressure when you are not at a hospital or clinic, you can use:  An automated blood pressure machine at a pharmacy.  A home blood pressure monitor.  If you are between 29 years and 55 years old, ask your health  care provider if you should take aspirin to prevent strokes.  Have regular diabetes screenings. This involves taking a blood sample to check your fasting blood sugar level.  If you are at a normal weight and have a low risk for diabetes, have this test once every three years after 79 years of age.  If you are overweight and have a high risk for diabetes, consider being tested at a younger age or more often. PREVENTING INFECTION  Hepatitis B  If you have a higher risk for hepatitis B, you should be screened for this virus. You are considered at high risk for hepatitis B if:  You were born in a country where hepatitis B is common. Ask your health care provider which countries are considered high risk.  Your parents were born in a high-risk country, and you have not been immunized against hepatitis B (hepatitis B vaccine).  You have HIV or AIDS.  You use needles to inject street drugs.  You live with someone who has hepatitis B.  You have had sex with someone who has hepatitis B.  You get hemodialysis treatment.  You take certain medicines for conditions, including cancer, organ transplantation, and autoimmune conditions. Hepatitis C  Blood testing is recommended for:  Everyone born from [birth year] through 1963.  Anyone with known risk factors for hepatitis C. Sexually transmitted infections (STIs)  You should be screened for sexually transmitted infections (STIs) including gonorrhea and chlamydia if:  You are sexually active and are younger than 79 years of age.  You are older than 79 years of age and your health care provider tells you that you are at risk for this type of infection.  Your sexual activity has changed since you were last screened and you are at an increased risk for chlamydia or gonorrhea. Ask your health care provider if you are at risk.  If you do not have HIV, but are at risk, it may be recommended that you take a prescription medicine daily to prevent HIV  infection. This is called pre-exposure prophylaxis (PrEP). You are considered at risk if:  You are sexually active and do not regularly use condoms or know the HIV status of your partner(s).  You take drugs by injection.  You are sexually active with a partner who has HIV. Talk with your health care provider about whether you are at high risk of being infected with HIV. If you choose to  begin PrEP, you should first be tested for HIV. You should then be tested every 3 months for as long as you are taking PrEP.  PREGNANCY   If you are premenopausal and you may become pregnant, ask your health care provider about preconception counseling.  If you may become pregnant, take 400 to 800 micrograms (mcg) of folic acid every day.  If you want to prevent pregnancy, talk to your health care provider about birth control (contraception). OSTEOPOROSIS AND MENOPAUSE   Osteoporosis is a disease in which the bones lose minerals and strength with aging. This can result in serious bone fractures. Your risk for osteoporosis can be identified using a bone density scan.  If you are 10 years of age or older, or if you are at risk for osteoporosis and fractures, ask your health care provider if you should be screened.  Ask your health care provider whether you should take a calcium or vitamin D supplement to lower your risk for osteoporosis.  Menopause may have certain physical symptoms and risks.  Hormone replacement therapy may reduce some of these symptoms and risks. Talk to your health care provider about whether hormone replacement therapy is right for you.  HOME CARE INSTRUCTIONS   Schedule regular health, dental, and eye exams.  Stay current with your immunizations.   Do not use any tobacco products including cigarettes, chewing tobacco, or electronic cigarettes.  If you are pregnant, do not drink alcohol.  If you are breastfeeding, limit how much and how often you drink alcohol.  Limit  alcohol intake to no more than 1 drink per day for nonpregnant women. One drink equals 12 ounces of beer, 5 ounces of wine, or 1 ounces of hard liquor.  Do not use street drugs.  Do not share needles.  Ask your health care provider for help if you need support or information about quitting drugs.  Tell your health care provider if you often feel depressed.  Tell your health care provider if you have ever been abused or do not feel safe at home.   This information is not intended to replace advice given to you by your health care provider. Make sure you discuss any questions you have with your health care provider.   Document Released: 01/15/2011 Document Revised: 07/23/2014 Document Reviewed: 06/03/2013 Elsevier Interactive Patient Education [employer].

## 2015-06-16 MED ORDER — DICLOFENAC SODIUM 1 % TD GEL: 2.0000 g | Freq: Three times a day (TID) | TRANSDERMAL | Status: DC | PRN

## 2015-06-16 NOTE — Addendum Note (Signed)
Addended by: Pricilla Holm A on: 06/16/2015 09:40 AM   Modules accepted: Orders

## 2015-07-14 ENCOUNTER — Encounter: Payer: Self-pay | Admitting: *Deleted

## 2015-07-28 DIAGNOSIS — H25012 Cortical age-related cataract, left eye: Secondary | ICD-10-CM | POA: Diagnosis not present

## 2015-07-28 DIAGNOSIS — H53002 Unspecified amblyopia, left eye: Secondary | ICD-10-CM | POA: Diagnosis not present

## 2015-07-28 DIAGNOSIS — H40013 Open angle with borderline findings, low risk, bilateral: Secondary | ICD-10-CM | POA: Diagnosis not present

## 2015-07-28 DIAGNOSIS — H2512 Age-related nuclear cataract, left eye: Secondary | ICD-10-CM | POA: Diagnosis not present

## 2015-07-28 DIAGNOSIS — E119 Type 2 diabetes mellitus without complications: Secondary | ICD-10-CM | POA: Diagnosis not present

## 2015-07-28 LAB — HM DIABETES EYE EXAM

## 2015-08-09 ENCOUNTER — Encounter: Payer: Self-pay | Admitting: Internal Medicine

## 2015-08-24 ENCOUNTER — Ambulatory Visit (INDEPENDENT_AMBULATORY_CARE_PROVIDER_SITE_OTHER): Payer: Medicare Other | Admitting: Podiatry

## 2015-08-24 ENCOUNTER — Encounter: Payer: Self-pay | Admitting: Podiatry

## 2015-08-24 DIAGNOSIS — M79676 Pain in unspecified toe(s): Secondary | ICD-10-CM | POA: Diagnosis not present

## 2015-08-24 DIAGNOSIS — B351 Tinea unguium: Secondary | ICD-10-CM | POA: Diagnosis not present

## 2015-08-24 NOTE — Progress Notes (Signed)
Patient ID: Kimberly Aguirre, female   DOB: May 15, 1934, 82 y.o.   MRN: [id number]  Subjective: This patient presents for scheduled visit again complaining of elongated and thickened toenails which are uncomfortable and walking wearing shoes and requests toenail debridement  Objective: Orientated 3 No open skin lesions bilaterally The toenails are hypertrophic, elongated, discolored, deformed and tender to direct palpation 6-10 Minimal keratoses dorsal PIPJ fourth toes bilaterally  Assessment: Symptomatic onychomycoses 6-10 Diabetic  Plan: Debridement toenails 6-10 mechanically and electronically without any bleeding Debrided keratoses 2 without any bleeding  Reappoint 3 months

## 2015-08-24 NOTE — Patient Instructions (Signed)
Diabetes and Foot Care Diabetes may cause you to have problems because of poor blood supply (circulation) to your feet and legs. This may cause the skin on your feet to become thinner, break easier, and heal more slowly. Your skin may become dry, and the skin may peel and crack. You may also have nerve damage in your legs and feet causing decreased feeling in them. You may not notice minor injuries to your feet that could lead to infections or more serious problems. Taking care of your feet is one of the most important things you can do for yourself.  HOME CARE INSTRUCTIONS  Wear shoes at all times, even in the house. Do not go barefoot. Bare feet are easily injured.  Check your feet daily for blisters, cuts, and redness. If you cannot see the bottom of your feet, use a mirror or ask someone for help.  Wash your feet with warm water (do not use hot water) and mild soap. Then pat your feet and the areas between your toes until they are completely dry. Do not soak your feet as this can dry your skin.  Apply a moisturizing lotion or petroleum jelly (that does not contain alcohol and is unscented) to the skin on your feet and to dry, brittle toenails. Do not apply lotion between your toes.  Trim your toenails straight across. Do not dig under them or around the cuticle. File the edges of your nails with an emery board or nail file.  Do not cut corns or calluses or try to remove them with medicine.  Wear clean socks or stockings every day. Make sure they are not too tight. Do not wear knee-high stockings since they may decrease blood flow to your legs.  Wear shoes that fit properly and have enough cushioning. To break in new shoes, wear them for just a few hours a day. This prevents you from injuring your feet. Always look in your shoes before you put them on to be sure there are no objects inside.  Do not cross your legs. This may decrease the blood flow to your feet.  If you find a minor scrape,  cut, or break in the skin on your feet, keep it and the skin around it clean and dry. These areas may be cleansed with mild soap and water. Do not cleanse the area with peroxide, alcohol, or iodine.  When you remove an adhesive bandage, be sure not to damage the skin around it.  If you have a wound, look at it several times a day to make sure it is healing.  Do not use heating pads or hot water bottles. They may burn your skin. If you have lost feeling in your feet or legs, you may not know it is happening until it is too late.  Make sure your health care provider performs a complete foot exam at least annually or more often if you have foot problems. Report any cuts, sores, or bruises to your health care provider immediately. SEEK MEDICAL CARE IF:   You have an injury that is not healing.  You have cuts or breaks in the skin.  You have an ingrown nail.  You notice redness on your legs or feet.  You feel burning or tingling in your legs or feet.  You have pain or cramps in your legs and feet.  Your legs or feet are numb.  Your feet always feel cold. SEEK IMMEDIATE MEDICAL CARE IF:   There is increasing redness,   swelling, or pain in or around a wound.  There is a red line that goes up your leg.  Pus is coming from a wound.  You develop a fever or as directed by your health care provider.  You notice a bad smell coming from an ulcer or wound.   This information is not intended to replace advice given to you by your health care provider. Make sure you discuss any questions you have with your health care provider.   Document Released: 06/29/2000 Document Revised: 03/04/2013 Document Reviewed: 12/09/2012 Elsevier Interactive Patient Education 2016 Elsevier Inc[employer]  

## 2015-09-13 ENCOUNTER — Telehealth: Payer: Self-pay | Admitting: *Deleted

## 2015-09-13 NOTE — Telephone Encounter (Signed)
Left msg on triage stating pharmacy has sent request for her clorazepate on yesterday checking status on refill. Per chart no electronic refill has been sent pls advise....Johny Chess

## 2015-09-13 NOTE — Telephone Encounter (Signed)
She was given rx for 90 pills (3 month supply) with refills and should not be filled.

## 2015-09-14 NOTE — Telephone Encounter (Signed)
Called pt no answer LMOM w/MD response../lmb 

## 2015-09-14 NOTE — Telephone Encounter (Signed)
Pt said that this med was given to her in Nov and that is why she is out now.  She is completely out

## 2015-09-16 ENCOUNTER — Other Ambulatory Visit: Payer: Self-pay | Admitting: Internal Medicine

## 2015-09-16 NOTE — Telephone Encounter (Signed)
Pharmacist could only find aug/2016 script on file---i have called in 90 supply for patient (verbal from [place] ok to refill for 90 days)--with 0 refills----pharmacist repeated back for understanding

## 2015-09-16 NOTE — Telephone Encounter (Signed)
Please advise, thanks.

## 2015-09-16 NOTE — Telephone Encounter (Signed)
This was refilled for 1 year Nov 2016, please call pharmacy to see why they do not have refills.

## 2015-09-16 NOTE — Telephone Encounter (Signed)
Pt called back and pt was given a years supply of the medication and advised her to call pharmacy.

## 2015-09-23 NOTE — Telephone Encounter (Signed)
Rx was not done when nurse called refill in verbally on 09/16/15. Updated med list close phone note...Johny Chess

## 2015-10-25 ENCOUNTER — Ambulatory Visit (INDEPENDENT_AMBULATORY_CARE_PROVIDER_SITE_OTHER): Payer: Medicare Other | Admitting: Internal Medicine

## 2015-10-25 ENCOUNTER — Other Ambulatory Visit (INDEPENDENT_AMBULATORY_CARE_PROVIDER_SITE_OTHER): Payer: Medicare Other

## 2015-10-25 ENCOUNTER — Encounter: Payer: Self-pay | Admitting: Internal Medicine

## 2015-10-25 VITALS — BP 130/72 | HR 80 | Temp 98.3°F | Resp 12 | Ht 61.0 in | Wt 119.8 lb

## 2015-10-25 DIAGNOSIS — E118 Type 2 diabetes mellitus with unspecified complications: Secondary | ICD-10-CM

## 2015-10-25 DIAGNOSIS — R21 Rash and other nonspecific skin eruption: Secondary | ICD-10-CM | POA: Insufficient documentation

## 2015-10-25 LAB — COMPREHENSIVE METABOLIC PANEL
ALK PHOS: 71 U/L (ref 39–117)
ALT: 10 U/L (ref 0–35)
AST: 16 U/L (ref 0–37)
Albumin: 4.1 g/dL (ref 3.5–5.2)
BILIRUBIN TOTAL: 0.4 mg/dL (ref 0.2–1.2)
BUN: 14 mg/dL (ref 6–23)
CO2: 33 mEq/L — ABNORMAL HIGH (ref 19–32)
Calcium: 10 mg/dL (ref 8.4–10.5)
Chloride: 101 mEq/L (ref 96–112)
Creatinine, Ser: 0.85 mg/dL (ref 0.40–1.20)
GFR: 82.75 mL/min (ref >60.00)
GLUCOSE: 128 mg/dL — AB (ref 70–99)
Potassium: 3.8 mEq/L (ref 3.5–5.1)
Sodium: 141 mEq/L (ref 135–145)
TOTAL PROTEIN: 7.8 g/dL (ref 6.0–8.3)

## 2015-10-25 LAB — LIPID PANEL
CHOL/HDL RATIO: 3
CHOLESTEROL: 173 mg/dL (ref 0–200)
HDL: 51.2 mg/dL (ref >39.00)
LDL CALC: 98 mg/dL (ref 0–99)
NonHDL: 121.31
TRIGLYCERIDES: 117 mg/dL (ref 0.0–149.0)
VLDL: 23.4 mg/dL (ref 0.0–40.0)

## 2015-10-25 LAB — HEMOGLOBIN A1C: HEMOGLOBIN A1C: 8 % — AB (ref 4.6–6.5)

## 2015-10-25 NOTE — Assessment & Plan Note (Signed)
Appears to be healing on its own, not fungal appearing. Given samples of antibiotic ointment to use on the area daily. No picking or rubbing. She will call back if it is worsening.

## 2015-10-25 NOTE — Progress Notes (Signed)
   Subjective:    Patient ID: Kimberly Aguirre, female    DOB: 07-Dec-1933, 82 y.o.   MRN: [id number]  HPI The patient is an 82 YO female coming in for redness in her naval. She noticed it last Thursday night with pain. She had redness in the belly button and some purulent discharge. No injury to the area. She denies cleaning it or using any creams or products on it. Has not been picking the area prior to rash. Has gradually improved but still some redness. Less pain.   Review of Systems  Constitutional: Negative for fever, activity change, appetite change, fatigue and unexpected weight change.  HENT: Negative.   Respiratory: Negative for cough, chest tightness, shortness of breath and wheezing.   Cardiovascular: Negative for chest pain, palpitations and leg swelling.  Gastrointestinal: Negative for nausea, abdominal pain, diarrhea, constipation and abdominal distention.  Musculoskeletal: Positive for arthralgias. Negative for myalgias and back pain.  Skin: Positive for rash.  Neurological: Negative for weakness, numbness and headaches.       Some burning in her feet at night time.      Objective:   Physical Exam  Constitutional: She is oriented to person, place, and time. She appears well-developed and well-nourished.  HENT:  Head: Normocephalic and atraumatic.  Eyes: EOM are normal.  Neck: Normal range of motion.  Cardiovascular: Normal rate and regular rhythm.   Pulmonary/Chest: Effort normal and breath sounds normal. No respiratory distress. She has no wheezes. She has no rales.  Abdominal: Soft. Bowel sounds are normal.  Musculoskeletal: She exhibits no edema.  Neurological: She is alert and oriented to person, place, and time. Coordination normal.  Skin: Skin is warm and dry. Rash noted.  Mild red scab on the naval, without purulent discharge or open wound, no rash surrounding or cellulitis.    Filed Vitals:   10/25/15 1113  BP: 130/72  Pulse: 80  Temp: 98.3 F (36.8 C)    TempSrc: Oral  Resp: 12  Height: 5\' 1"  (1.549 m)  Weight: 119 lb 12.8 oz (54.341 kg)  SpO2: 98%      Assessment & Plan:

## 2015-10-25 NOTE — Progress Notes (Signed)
Pre visit review using our clinic review tool, if applicable. No additional management support is needed unless otherwise documented below in the visit note. 

## 2015-10-25 NOTE — Patient Instructions (Signed)
We will give you the cream to use on the naval 1-2 times per day. This should keep getting better. If it starts getting worse just call us and we can call in something stronger.   Think about getting your labs done today to check on the kidneys and sugars.

## 2015-11-23 ENCOUNTER — Encounter: Payer: Self-pay | Admitting: Podiatry

## 2015-11-23 ENCOUNTER — Ambulatory Visit (INDEPENDENT_AMBULATORY_CARE_PROVIDER_SITE_OTHER): Payer: Medicare Other | Admitting: Podiatry

## 2015-11-23 DIAGNOSIS — B351 Tinea unguium: Secondary | ICD-10-CM | POA: Diagnosis not present

## 2015-11-23 DIAGNOSIS — M79676 Pain in unspecified toe(s): Secondary | ICD-10-CM

## 2015-11-23 NOTE — Patient Instructions (Signed)
Diabetes and Foot Care Diabetes may cause you to have problems because of poor blood supply (circulation) to your feet and legs. This may cause the skin on your feet to become thinner, break easier, and heal more slowly. Your skin may become dry, and the skin may peel and crack. You may also have nerve damage in your legs and feet causing decreased feeling in them. You may not notice minor injuries to your feet that could lead to infections or more serious problems. Taking care of your feet is one of the most important things you can do for yourself.  HOME CARE INSTRUCTIONS  Wear shoes at all times, even in the house. Do not go barefoot. Bare feet are easily injured.  Check your feet daily for blisters, cuts, and redness. If you cannot see the bottom of your feet, use a mirror or ask someone for help.  Wash your feet with warm water (do not use hot water) and mild soap. Then pat your feet and the areas between your toes until they are completely dry. Do not soak your feet as this can dry your skin.  Apply a moisturizing lotion or petroleum jelly (that does not contain alcohol and is unscented) to the skin on your feet and to dry, brittle toenails. Do not apply lotion between your toes.  Trim your toenails straight across. Do not dig under them or around the cuticle. File the edges of your nails with an emery board or nail file.  Do not cut corns or calluses or try to remove them with medicine.  Wear clean socks or stockings every day. Make sure they are not too tight. Do not wear knee-high stockings since they may decrease blood flow to your legs.  Wear shoes that fit properly and have enough cushioning. To break in new shoes, wear them for just a few hours a day. This prevents you from injuring your feet. Always look in your shoes before you put them on to be sure there are no objects inside.  Do not cross your legs. This may decrease the blood flow to your feet.  If you find a minor scrape,  cut, or break in the skin on your feet, keep it and the skin around it clean and dry. These areas may be cleansed with mild soap and water. Do not cleanse the area with peroxide, alcohol, or iodine.  When you remove an adhesive bandage, be sure not to damage the skin around it.  If you have a wound, look at it several times a day to make sure it is healing.  Do not use heating pads or hot water bottles. They may burn your skin. If you have lost feeling in your feet or legs, you may not know it is happening until it is too late.  Make sure your health care provider performs a complete foot exam at least annually or more often if you have foot problems. Report any cuts, sores, or bruises to your health care provider immediately. SEEK MEDICAL CARE IF:   You have an injury that is not healing.  You have cuts or breaks in the skin.  You have an ingrown nail.  You notice redness on your legs or feet.  You feel burning or tingling in your legs or feet.  You have pain or cramps in your legs and feet.  Your legs or feet are numb.  Your feet always feel cold. SEEK IMMEDIATE MEDICAL CARE IF:   There is increasing redness,   swelling, or pain in or around a wound.  There is a red line that goes up your leg.  Pus is coming from a wound.  You develop a fever or as directed by your health care provider.  You notice a bad smell coming from an ulcer or wound.   This information is not intended to replace advice given to you by your health care provider. Make sure you discuss any questions you have with your health care provider.   Document Released: 06/29/2000 Document Revised: 03/04/2013 Document Reviewed: 12/09/2012 Elsevier Interactive Patient Education 2016 Elsevier Inc[employer]  

## 2015-11-23 NOTE — Progress Notes (Signed)
Patient ID: Kimberly Aguirre, female   DOB: 1934-01-03, 82 y.o.   MRN: [id number]  Subjective: This patient presents for scheduled visit again complaining of elongated and thickened toenails which are uncomfortable and walking wearing shoes and requests toenail debridement  Objective: Orientated 3 No open skin lesions bilaterally The toenails are hypertrophic, elongated, discolored, deformed and tender to direct palpation 6-10 Minimal keratoses dorsal PIPJ fourth toes bilaterally  Assessment: Symptomatic onychomycoses 6-10 Diabetic  Plan: Debridement toenails 6-10 mechanically and electronically without any bleeding Debrided keratoses 2 without any bleeding  Reappoint 3 months

## 2015-12-13 ENCOUNTER — Ambulatory Visit (INDEPENDENT_AMBULATORY_CARE_PROVIDER_SITE_OTHER): Payer: Medicare Other | Admitting: Internal Medicine

## 2015-12-13 ENCOUNTER — Other Ambulatory Visit (INDEPENDENT_AMBULATORY_CARE_PROVIDER_SITE_OTHER): Payer: Medicare Other

## 2015-12-13 ENCOUNTER — Encounter: Payer: Self-pay | Admitting: Internal Medicine

## 2015-12-13 VITALS — BP 122/62 | HR 84 | Temp 97.7°F | Resp 12 | Ht 61.0 in | Wt 118.0 lb

## 2015-12-13 DIAGNOSIS — R634 Abnormal weight loss: Secondary | ICD-10-CM

## 2015-12-13 LAB — CBC
HEMATOCRIT: 42.2 % (ref 36.0–46.0)
HEMOGLOBIN: 14.2 g/dL (ref 12.0–15.0)
MCHC: 33.6 g/dL (ref 30.0–36.0)
MCV: 90.1 fl (ref 78.0–100.0)
PLATELETS: 261 10*3/uL (ref 150.0–400.0)
RBC: 4.68 Mil/uL (ref 3.87–5.11)
RDW: 13.3 % (ref 11.5–15.5)
WBC: 6 10*3/uL (ref 4.0–10.5)

## 2015-12-13 LAB — TSH: TSH: 0.37 u[IU]/mL (ref 0.35–4.50)

## 2015-12-13 LAB — T4, FREE: Free T4: 0.82 ng/dL (ref 0.60–1.60)

## 2015-12-13 MED ORDER — CLORAZEPATE DIPOTASSIUM 7.5 MG PO TABS: ORAL_TABLET | ORAL | Status: DC

## 2015-12-13 MED ORDER — GLUCOSE BLOOD VI STRP: 1.0000 | ORAL_STRIP | Freq: Every day | Status: DC

## 2015-12-13 NOTE — Progress Notes (Signed)
Pre visit review using our clinic review tool, if applicable. No additional management support is needed unless otherwise documented below in the visit note. 

## 2015-12-13 NOTE — Patient Instructions (Signed)
We will get you back in with the GI department upstairs to do another colon cancer screening and we are checking some extra blood work today.

## 2015-12-13 NOTE — Progress Notes (Signed)
   Subjective:    Patient ID: Kimberly Aguirre, female    DOB: Jan 08, 1934, 82 y.o.   MRN: [id number]  HPI The patient is an 82 YO female coming in for concern about weight loss. In the last 6 months she is down about 10 pounds. This is not intentional. Up to date on mammogram. Last colonoscopy 2005 with 1 polyp. No change to her meds although she is only taking the metformin once a day sometimes. She is not as hungry and lives alone so she is not eating as well as she did. No new cough or pains. No back pain. No blood in her stools. No moles or skin spots that are growing or changing that she noticed.   Review of Systems  Constitutional: Positive for unexpected weight change. Negative for fever, activity change, appetite change and fatigue.  HENT: Negative.   Respiratory: Negative for cough, chest tightness, shortness of breath and wheezing.   Cardiovascular: Negative for chest pain, palpitations and leg swelling.  Gastrointestinal: Negative for nausea, abdominal pain, diarrhea, constipation and abdominal distention.  Musculoskeletal: Positive for arthralgias. Negative for myalgias and back pain.  Neurological: Negative for weakness, numbness and headaches.      Objective:   Physical Exam  Constitutional: She is oriented to person, place, and time. She appears well-developed and well-nourished.  HENT:  Head: Normocephalic and atraumatic.  Eyes: EOM are normal.  Neck: Normal range of motion.  Cardiovascular: Normal rate and regular rhythm.   Pulmonary/Chest: Effort normal and breath sounds normal. No respiratory distress. She has no wheezes. She has no rales.  Abdominal: Soft. Bowel sounds are normal.  Musculoskeletal: She exhibits no edema.  Lymphadenopathy:    She has no cervical adenopathy.  Neurological: She is alert and oriented to person, place, and time. Coordination normal.  Skin: Skin is warm and dry.   Filed Vitals:   12/13/15 1033  BP: 122/62  Pulse: 84  Temp: 97.7 F  (36.5 C)  TempSrc: Oral  Resp: 12  Height: 5\' 1"  (1.549 m)  Weight: 118 lb (53.524 kg)  SpO2: 96%      Assessment & Plan:

## 2015-12-13 NOTE — Assessment & Plan Note (Signed)
She is down about 10 pounds in the last 6 months. She denies new cough or back pain. Colonoscopy could use updating and will refer. Checking CBC and TSH and free T4 for metabolic cause. Labs from last visit unremarkable. Weight stable from 1 month ago. Will monitor and encouraged her to work on eating better and she will try. Mammogram up to date. Is a never smoker.

## 2016-01-16 ENCOUNTER — Other Ambulatory Visit: Payer: Self-pay | Admitting: Internal Medicine

## 2016-01-16 DIAGNOSIS — Z1231 Encounter for screening mammogram for malignant neoplasm of breast: Secondary | ICD-10-CM

## 2016-01-27 ENCOUNTER — Ambulatory Visit
Admission: RE | Admit: 2016-01-27 | Discharge: 2016-01-27 | Disposition: A | Discharge status: Home / Self Care | Payer: Medicare Other | Source: Ambulatory Visit | Attending: Internal Medicine | Admitting: Internal Medicine

## 2016-01-27 DIAGNOSIS — Z1231 Encounter for screening mammogram for malignant neoplasm of breast: Secondary | ICD-10-CM | POA: Diagnosis not present

## 2016-02-13 ENCOUNTER — Ambulatory Visit (INDEPENDENT_AMBULATORY_CARE_PROVIDER_SITE_OTHER): Payer: Medicare Other | Admitting: Gastroenterology

## 2016-02-13 ENCOUNTER — Encounter: Payer: Self-pay | Admitting: Gastroenterology

## 2016-02-13 VITALS — BP 112/60 | HR 68 | Ht 61.0 in | Wt 121.0 lb

## 2016-02-13 DIAGNOSIS — R634 Abnormal weight loss: Secondary | ICD-10-CM | POA: Diagnosis not present

## 2016-02-13 DIAGNOSIS — Z86010 Personal history of colonic polyps: Secondary | ICD-10-CM | POA: Diagnosis not present

## 2016-02-13 NOTE — Progress Notes (Signed)
[facility] Consult Note:  History: Kimberly Aguirre 02/13/2016  Referring physician: Hoyt Koch, MD  Reason for consult/chief complaint: Weight Loss (lack of appetite.)   Subjective  HPI:  This elderly woman was referred by primary care for weight loss of 10-12 pounds in the last 6 months. She has no localizing GI symptoms. Specifically, she denies abdominal pain, altered bowel habits, rectal bleeding, nausea, vomiting or dysphagia. She lives alone and often just does not feel like preparing food. Her appetite is variable but for no specific reason. She had 2 diminutive tubular adenoma polyps removed on a colonoscopy in 2005  ROS:  Review of Systems  Constitutional: Negative for appetite change and unexpected weight change.  HENT: Negative for mouth sores and voice change.   Eyes: Negative for pain and redness.  Respiratory: Negative for cough and shortness of breath.   Cardiovascular: Negative for chest pain and palpitations.  Genitourinary: Negative for dysuria and hematuria.  Musculoskeletal: Negative for arthralgias and myalgias.  Skin: Negative for pallor and rash.  Neurological: Negative for weakness and headaches.  Hematological: Negative for adenopathy.   She sometimes feels hoarse at the end of the day  Past Medical History: Past Medical History:  Diagnosis Date  . Anxiety   . Blindness of left eye   . Chest pain, atypical   . Dermatitis   . Diabetes mellitus   . DJD (degenerative joint disease)   . Headache(784.0)   . Hiatal hernia   . Hx of colonic polyps   . IBS (irritable bowel syndrome)   . Osteopenia   . Overactive bladder   . Venous insufficiency      Past Surgical History: Past Surgical History:  Procedure Laterality Date  . EYE SURGERY     age 49  . VAGINAL HYSTERECTOMY     and ureteral sling 06/2010 by Dr. Cristine Polio     Family History: Family History  Problem Relation Age of Onset  . Diabetes Mother   .  Diabetes Father   . CAD Neg Hx     Social History: Social History   Social History  . Marital status: Widowed    Spouse name: N/A  . Number of children: 2  . Years of education: N/A   Occupational History  . Retired-daycare business Retired   Social History Main Topics  . Smoking status: Never Smoker  . Smokeless tobacco: Never Used  . Alcohol use No  . Drug use: No  . Sexual activity: Not Asked   Other Topics Concern  . None   Social History Narrative   4 brothers and 5 sisters    Allergies: Allergies  Allergen Reactions  . Codeine     REACTION: nausea    Outpatient Meds: Current Outpatient Prescriptions  Medication Sig Dispense Refill  . aspirin 81 MG tablet Take 81 mg by mouth daily.      . Blood Glucose Monitoring Suppl (ACCU-CHEK AVIVA PLUS) W/DEVICE KIT 1 Device by Does not apply route as directed. 1 kit 0  . clorazepate (TRANXENE) 7.5 MG tablet take 1 tablet by mouth once daily if needed 90 tablet 1  . diclofenac sodium (VOLTAREN) 1 % GEL Apply 2 g topically 3 (three) times daily as needed (pain). 100 g 6  . fish oil-omega-3 fatty acids 1000 MG capsule Take 1 g by mouth daily.      [name] glucose blood (ACCU-CHEK AVIVA PLUS) test strip 1 each by Other route daily. Use to check blood sugars twice a  day Dx E11.9 100 each 11  . Lancets (ACCU-CHEK MULTICLIX) lancets   0  . metFORMIN (GLUCOPHAGE) 500 MG tablet Take 1 tablet (500 mg total) by mouth 2 (two) times daily with a meal. 180 tablet 3  . Multiple Vitamins-Minerals (CENTRUM SILVER PO) Take 1 tablet by mouth daily.       No current facility-administered medications for this visit.       ___________________________________________________________________ Objective   Exam:  BP 112/60   Pulse 68   Ht 5' 1"  (1.549 m)   Wt 121 lb (54.9 kg)   BMI 22.86 kg/m    General: this is a(n) Thin otherwise well-appearing elderly woman. No temporal wasting   Eyes: sclera anicteric, no redness  ENT: oral  mucosa moist without lesions, no cervical or supraclavicular lymphadenopathy, good dentition  CV: RRR without murmur, S1/S2, no JVD, no peripheral edema  Resp: clear to auscultation bilaterally, normal RR and effort noted  GI: soft, no tenderness, with active bowel sounds. No guarding or palpable organomegaly noted.  Skin; warm and dry, no rash or jaundice noted  Neuro: awake, alert and oriented x 3. Normal gross motor function and fluent speech  Labs:  CBC    Component Value Date/Time   WBC 6.0 12/13/2015 1120   RBC 4.68 12/13/2015 1120   HGB 14.2 12/13/2015 1120   HCT 42.2 12/13/2015 1120   PLT 261.0 12/13/2015 1120   MCV 90.1 12/13/2015 1120   MCH 29.6 07/08/2010 0535   MCHC 33.6 12/13/2015 1120   RDW 13.3 12/13/2015 1120   LYMPHSABS 1.6 03/05/2014 1034   MONOABS 0.6 03/05/2014 1034   EOSABS 0.2 03/05/2014 1034   BASOSABS 0.0 03/05/2014 1034   TSH/Free T4 nml Albumin 4.1   Assessment: Encounter Diagnoses  Name Primary?  . Abnormal loss of weight Yes  . History of colonic polyps    She has no localizing GI symptoms, therefore it is not clear that the weight loss is really GI related. Her albumin is normal Nevertheless, occult issues may be present, and I offered her an upper endoscopy and colonoscopy to look for common GI sources weight loss. She has decided to forego any further testing at this time, and will call me if she changes her mind or if new symptoms arise.  Thank you for the courtesy of this consult.  Please call me with any questions or concerns.  Nelida Meuse III  CC: Hoyt Koch, MD

## 2016-02-13 NOTE — Patient Instructions (Signed)
If you are age 80 or older, your body mass index should be between 23-30. Your Body mass index is 22.86 kg/m. If this is out of the aforementioned range listed, please consider follow up with your Primary Care Provider.  If you are age 36 or younger, your body mass index should be between 19-25. Your Body mass index is 22.86 kg/m. If this is out of the aformentioned range listed, please consider follow up with your Primary Care Provider.   Thank you for choosing [facility]  Dr Wilfrid Lund III

## 2016-02-16 ENCOUNTER — Telehealth: Payer: Self-pay | Admitting: Gastroenterology

## 2016-02-16 NOTE — Telephone Encounter (Signed)
Left message on machine to call back  

## 2016-02-17 NOTE — Telephone Encounter (Signed)
The pt wanted to know what BMI was for.  I explained that the weight and height is used to calculate a percentage for Body mass index. She verbalized all questions answered.

## 2016-02-20 ENCOUNTER — Telehealth: Payer: Self-pay | Admitting: Internal Medicine

## 2016-02-20 ENCOUNTER — Other Ambulatory Visit: Payer: Self-pay | Admitting: Geriatric Medicine

## 2016-02-20 MED ORDER — ACCU-CHEK MULTICLIX LANCETS MISC: 3 refills | Status: DC

## 2016-02-20 NOTE — Telephone Encounter (Signed)
Patient is requesting a renewal for Lancets (ACCU-CHEK MULTICLIX) lancets [id number]  . [facility] on [address]

## 2016-02-20 NOTE — Telephone Encounter (Signed)
Sent to pharmacy 

## 2016-02-22 ENCOUNTER — Other Ambulatory Visit: Payer: Self-pay | Admitting: Geriatric Medicine

## 2016-02-22 MED ORDER — ACCU-CHEK MULTICLIX LANCETS MISC: 3 refills | Status: DC

## 2016-02-22 NOTE — Telephone Encounter (Signed)
Pt stating we sent in Rx for Lancets but direction not clear on there and last time is take it twice daily but the quantity of 100 is not going to be enough if she take twice a day. Please call her back today

## 2016-02-22 NOTE — Telephone Encounter (Signed)
Sent to pharmacy 

## 2016-02-22 NOTE — Telephone Encounter (Signed)
Pt informed pharmacy has all of the meds have a sig.

## 2016-02-29 ENCOUNTER — Encounter: Payer: Self-pay | Admitting: Podiatry

## 2016-02-29 ENCOUNTER — Ambulatory Visit (INDEPENDENT_AMBULATORY_CARE_PROVIDER_SITE_OTHER): Payer: Medicare Other | Admitting: Podiatry

## 2016-02-29 DIAGNOSIS — B351 Tinea unguium: Secondary | ICD-10-CM | POA: Diagnosis not present

## 2016-02-29 DIAGNOSIS — M79676 Pain in unspecified toe(s): Secondary | ICD-10-CM

## 2016-02-29 NOTE — Patient Instructions (Signed)
Diabetes and Foot Care Diabetes may cause you to have problems because of poor blood supply (circulation) to your feet and legs. This may cause the skin on your feet to become thinner, break easier, and heal more slowly. Your skin may become dry, and the skin may peel and crack. You may also have nerve damage in your legs and feet causing decreased feeling in them. You may not notice minor injuries to your feet that could lead to infections or more serious problems. Taking care of your feet is one of the most important things you can do for yourself.  HOME CARE INSTRUCTIONS  Wear shoes at all times, even in the house. Do not go barefoot. Bare feet are easily injured.  Check your feet daily for blisters, cuts, and redness. If you cannot see the bottom of your feet, use a mirror or ask someone for help.  Wash your feet with warm water (do not use hot water) and mild soap. Then pat your feet and the areas between your toes until they are completely dry. Do not soak your feet as this can dry your skin.  Apply a moisturizing lotion or petroleum jelly (that does not contain alcohol and is unscented) to the skin on your feet and to dry, brittle toenails. Do not apply lotion between your toes.  Trim your toenails straight across. Do not dig under them or around the cuticle. File the edges of your nails with an emery board or nail file.  Do not cut corns or calluses or try to remove them with medicine.  Wear clean socks or stockings every day. Make sure they are not too tight. Do not wear knee-high stockings since they may decrease blood flow to your legs.  Wear shoes that fit properly and have enough cushioning. To break in new shoes, wear them for just a few hours a day. This prevents you from injuring your feet. Always look in your shoes before you put them on to be sure there are no objects inside.  Do not cross your legs. This may decrease the blood flow to your feet.  If you find a minor scrape,  cut, or break in the skin on your feet, keep it and the skin around it clean and dry. These areas may be cleansed with mild soap and water. Do not cleanse the area with peroxide, alcohol, or iodine.  When you remove an adhesive bandage, be sure not to damage the skin around it.  If you have a wound, look at it several times a day to make sure it is healing.  Do not use heating pads or hot water bottles. They may burn your skin. If you have lost feeling in your feet or legs, you may not know it is happening until it is too late.  Make sure your health care provider performs a complete foot exam at least annually or more often if you have foot problems. Report any cuts, sores, or bruises to your health care provider immediately. SEEK MEDICAL CARE IF:   You have an injury that is not healing.  You have cuts or breaks in the skin.  You have an ingrown nail.  You notice redness on your legs or feet.  You feel burning or tingling in your legs or feet.  You have pain or cramps in your legs and feet.  Your legs or feet are numb.  Your feet always feel cold. SEEK IMMEDIATE MEDICAL CARE IF:   There is increasing redness,   swelling, or pain in or around a wound.  There is a red line that goes up your leg.  Pus is coming from a wound.  You develop a fever or as directed by your health care provider.  You notice a bad smell coming from an ulcer or wound.   This information is not intended to replace advice given to you by your health care provider. Make sure you discuss any questions you have with your health care provider.   Document Released: 06/29/2000 Document Revised: 03/04/2013 Document Reviewed: 12/09/2012 Elsevier Interactive Patient Education 2016 Elsevier Inc[employer]  

## 2016-02-29 NOTE — Progress Notes (Signed)
Patient ID: Kimberly Aguirre, female   DOB: Nov 24, 1933, 82 y.o.   MRN: [id number]     Subjective: This patient presents for scheduled visit again complaining of elongated and thickened toenails which are uncomfortable and walking wearing shoes and requests toenail debridement  Objective: Orientated 3 No open skin lesions bilaterally The toenails are hypertrophic, elongated, discolored, deformed and tender to direct palpation 6-10 Minimal keratoses dorsal PIPJ fourth toes bilaterally  Assessment: Symptomatic onychomycoses 6-10 Diabetic  Plan: Debridement toenails 6-10 mechanically and electronically without any bleeding Debrided keratoses 2 without any bleeding  Reappoint 3 months

## 2016-04-05 ENCOUNTER — Ambulatory Visit (INDEPENDENT_AMBULATORY_CARE_PROVIDER_SITE_OTHER): Payer: Medicare Other

## 2016-04-05 DIAGNOSIS — Z23 Encounter for immunization: Secondary | ICD-10-CM

## 2016-05-29 ENCOUNTER — Ambulatory Visit (INDEPENDENT_AMBULATORY_CARE_PROVIDER_SITE_OTHER): Payer: Medicare Other | Admitting: Podiatry

## 2016-05-29 VITALS — BP 121/81 | HR 87

## 2016-05-29 DIAGNOSIS — M79676 Pain in unspecified toe(s): Secondary | ICD-10-CM

## 2016-05-29 DIAGNOSIS — L84 Corns and callosities: Secondary | ICD-10-CM

## 2016-05-29 DIAGNOSIS — E669 Obesity, unspecified: Secondary | ICD-10-CM

## 2016-05-29 DIAGNOSIS — E1169 Type 2 diabetes mellitus with other specified complication: Secondary | ICD-10-CM | POA: Diagnosis not present

## 2016-05-29 DIAGNOSIS — B351 Tinea unguium: Secondary | ICD-10-CM | POA: Diagnosis not present

## 2016-05-29 NOTE — Patient Instructions (Signed)

## 2016-05-29 NOTE — Progress Notes (Signed)
Patient ID: Kimberly Aguirre, female   DOB: 1934/02/19, 82 y.o.   MRN: [id number]   Subjective: This patient presents for scheduled visit again complaining of elongated and thickened toenails which are uncomfortable and walking wearing shoes and requests toenail debridement  Objective: Orientated 3 DP pulses 2/4 bilaterally PT pulses 1/4 bilaterally Capillary reflex immediate bilaterally Sensation to 10 g monofilament wire intact 4/5 right and 5/5 left Vibratory sensation reactive bilaterally Ankle reflex equal reactive bilaterally HAV bilaterally Hammertoe fourth bilaterally No open skin lesions bilaterally The toenails are hypertrophic, elongated, discolored, deformed and tender to direct palpation 6-10 Minimal keratoses dorsal PIPJ fourth toes bilaterally  Assessment: Symptomatic onychomycoses 6-10 Protective sensation intact bilaterally Diabetic with decreased posterior tibial pulses without any open lesions or pre-ulcerative lesions   Plan: Debridement toenails 6-10 mechanically and electronically without any bleeding Debrided keratoses 2 without any bleeding  Reappoint 3 months

## 2016-06-11 ENCOUNTER — Other Ambulatory Visit: Payer: Self-pay | Admitting: Internal Medicine

## 2016-06-12 NOTE — Telephone Encounter (Signed)
Faxed to pharmacy

## 2016-07-31 DIAGNOSIS — H40013 Open angle with borderline findings, low risk, bilateral: Secondary | ICD-10-CM | POA: Diagnosis not present

## 2016-07-31 DIAGNOSIS — E119 Type 2 diabetes mellitus without complications: Secondary | ICD-10-CM | POA: Diagnosis not present

## 2016-07-31 DIAGNOSIS — H2512 Age-related nuclear cataract, left eye: Secondary | ICD-10-CM | POA: Diagnosis not present

## 2016-07-31 DIAGNOSIS — H25012 Cortical age-related cataract, left eye: Secondary | ICD-10-CM | POA: Diagnosis not present

## 2016-07-31 LAB — HM DIABETES EYE EXAM

## 2016-08-07 ENCOUNTER — Encounter: Payer: Self-pay | Admitting: Internal Medicine

## 2016-08-07 NOTE — Progress Notes (Unsigned)
Results entered and sent to scan  

## 2016-08-29 ENCOUNTER — Ambulatory Visit: Payer: Medicare Other | Admitting: Podiatry

## 2016-09-05 ENCOUNTER — Ambulatory Visit (INDEPENDENT_AMBULATORY_CARE_PROVIDER_SITE_OTHER): Payer: Medicare Other | Admitting: Podiatry

## 2016-09-05 ENCOUNTER — Encounter: Payer: Self-pay | Admitting: Podiatry

## 2016-09-05 VITALS — BP 114/64 | HR 86 | Resp 18

## 2016-09-05 DIAGNOSIS — E1151 Type 2 diabetes mellitus with diabetic peripheral angiopathy without gangrene: Secondary | ICD-10-CM

## 2016-09-05 DIAGNOSIS — M79676 Pain in unspecified toe(s): Secondary | ICD-10-CM | POA: Diagnosis not present

## 2016-09-05 DIAGNOSIS — B351 Tinea unguium: Secondary | ICD-10-CM

## 2016-09-05 NOTE — Patient Instructions (Signed)

## 2016-09-05 NOTE — Progress Notes (Signed)
Patient ID: Kimberly Aguirre, female   DOB: 24-Mar-1934, 83 y.o.   MRN: [id number]     Subjective: This patient presents for scheduled visit again complaining of elongated and thickened toenails which are uncomfortable and walking wearing shoes and requests toenail debridement  Objective: Orientated 3 DP pulses 2/4 bilaterally PT pulses 1/4 bilaterally Capillary reflex immediate bilaterally Sensation to 10 g monofilament wire intact 4/5 right and 5/5 left Vibratory sensation reactive bilaterally Ankle reflex equal reactive bilaterally HAV bilaterally Hammertoe fourth bilaterally No open skin lesions bilaterally The toenails are hypertrophic, elongated, discolored, deformed and tender to direct palpation 6-10 Minimal keratoses dorsal PIPJ fourth toes bilaterally  Assessment: Symptomatic onychomycoses 6-10 Protective sensation intact bilaterally Diabetic with decreased posterior tibial pulses without any open lesions or pre-ulcerative lesions Diabetic peripheral arterial disease associated with decreased posterior tibial pulses  Plan: Debridement toenails 6-10 mechanically and electronically without any bleeding  Reappoint 3 months

## 2016-09-17 DIAGNOSIS — H04123 Dry eye syndrome of bilateral lacrimal glands: Secondary | ICD-10-CM | POA: Diagnosis not present

## 2016-09-17 DIAGNOSIS — M3501 Sicca syndrome with keratoconjunctivitis: Secondary | ICD-10-CM | POA: Diagnosis not present

## 2016-10-15 ENCOUNTER — Other Ambulatory Visit: Payer: Self-pay | Admitting: Internal Medicine

## 2016-10-17 ENCOUNTER — Telehealth: Payer: Self-pay | Admitting: Internal Medicine

## 2016-10-17 NOTE — Telephone Encounter (Signed)
Called patient to schedule awv. lvm for patient to call office to schedule appt.  °

## 2016-10-25 ENCOUNTER — Ambulatory Visit (INDEPENDENT_AMBULATORY_CARE_PROVIDER_SITE_OTHER): Payer: Medicare Other | Admitting: Internal Medicine

## 2016-10-25 ENCOUNTER — Encounter: Payer: Self-pay | Admitting: Internal Medicine

## 2016-10-25 ENCOUNTER — Other Ambulatory Visit (INDEPENDENT_AMBULATORY_CARE_PROVIDER_SITE_OTHER): Payer: Medicare Other

## 2016-10-25 VITALS — BP 120/62 | HR 80 | Temp 97.8°F | Resp 12 | Ht 61.0 in | Wt 127.0 lb

## 2016-10-25 DIAGNOSIS — E785 Hyperlipidemia, unspecified: Secondary | ICD-10-CM | POA: Diagnosis not present

## 2016-10-25 DIAGNOSIS — F411 Generalized anxiety disorder: Secondary | ICD-10-CM

## 2016-10-25 DIAGNOSIS — Z0000 Encounter for general adult medical examination without abnormal findings: Secondary | ICD-10-CM | POA: Diagnosis not present

## 2016-10-25 DIAGNOSIS — E669 Obesity, unspecified: Secondary | ICD-10-CM

## 2016-10-25 DIAGNOSIS — E1169 Type 2 diabetes mellitus with other specified complication: Secondary | ICD-10-CM

## 2016-10-25 DIAGNOSIS — I10 Essential (primary) hypertension: Secondary | ICD-10-CM | POA: Diagnosis not present

## 2016-10-25 LAB — LIPID PANEL
CHOLESTEROL: 200 mg/dL (ref 0–200)
HDL: 50.8 mg/dL (ref >39.00)
LDL CALC: 119 mg/dL — AB (ref 0–99)
NONHDL: 149.09
Total CHOL/HDL Ratio: 4
Triglycerides: 149 mg/dL (ref 0.0–149.0)
VLDL: 29.8 mg/dL (ref 0.0–40.0)

## 2016-10-25 LAB — COMPREHENSIVE METABOLIC PANEL
ALT: 13 U/L (ref 0–35)
AST: 18 U/L (ref 0–37)
Albumin: 4.3 g/dL (ref 3.5–5.2)
Alkaline Phosphatase: 81 U/L (ref 39–117)
BUN: 13 mg/dL (ref 6–23)
CHLORIDE: 101 meq/L (ref 96–112)
CO2: 33 meq/L — AB (ref 19–32)
Calcium: 10 mg/dL (ref 8.4–10.5)
Creatinine, Ser: 0.81 mg/dL (ref 0.40–1.20)
GFR: 87.26 mL/min (ref >60.00)
GLUCOSE: 143 mg/dL — AB (ref 70–99)
POTASSIUM: 3.7 meq/L (ref 3.5–5.1)
SODIUM: 141 meq/L (ref 135–145)
TOTAL PROTEIN: 8.1 g/dL (ref 6.0–8.3)
Total Bilirubin: 0.4 mg/dL (ref 0.2–1.2)

## 2016-10-25 LAB — HEMOGLOBIN A1C: HEMOGLOBIN A1C: 8.9 % — AB (ref 4.6–6.5)

## 2016-10-25 LAB — MICROALBUMIN / CREATININE URINE RATIO
Creatinine,U: 90.9 mg/dL
MICROALB/CREAT RATIO: 1.1 mg/g (ref 0.0–30.0)
Microalb, Ur: 1 mg/dL (ref 0.0–1.9)

## 2016-10-25 NOTE — Patient Instructions (Signed)
We will check the labs today and call you back with the results.   Health Maintenance, Female Adopting a healthy lifestyle and getting preventive care can go a long way to promote health and wellness. Talk with your health care provider about what schedule of regular examinations is right for you. This is a good chance for you to check in with your provider about disease prevention and staying healthy. In between checkups, there are plenty of things you can do on your own. Experts have done a lot of research about which lifestyle changes and preventive measures are most likely to keep you healthy. Ask your health care provider for more information. Weight and diet Eat a healthy diet  Be sure to include plenty of vegetables, fruits, low-fat dairy products, and lean protein.  Do not eat a lot of foods high in solid fats, added sugars, or salt.  Get regular exercise. This is one of the most important things you can do for your health.  Most adults should exercise for at least 150 minutes each week. The exercise should increase your heart rate and make you sweat (moderate-intensity exercise).  Most adults should also do strengthening exercises at least twice a week. This is in addition to the moderate-intensity exercise. Maintain a healthy weight  Body mass index (BMI) is a measurement that can be used to identify possible weight problems. It estimates body fat based on height and weight. Your health care provider can help determine your BMI and help you achieve or maintain a healthy weight.  For females 23 years of age and older:  A BMI below 18.5 is considered underweight.  A BMI of 18.5 to 24.9 is normal.  A BMI of 25 to 29.9 is considered overweight.  A BMI of 30 and above is considered obese. Watch levels of cholesterol and blood lipids  You should start having your blood tested for lipids and cholesterol at 81 years of age, then have this test every 5 years.  You may need to have  your cholesterol levels checked more often if:  Your lipid or cholesterol levels are high.  You are older than 81 years of age.  You are at high risk for heart disease. Cancer screening Lung Cancer  Lung cancer screening is recommended for adults 29-62 years old who are at high risk for lung cancer because of a history of smoking.  A yearly low-dose CT scan of the lungs is recommended for people who:  Currently smoke.  Have quit within the past 15 years.  Have at least a 30-pack-year history of smoking. A pack year is smoking an average of one pack of cigarettes a day for 1 year.  Yearly screening should continue until it has been 15 years since you quit.  Yearly screening should stop if you develop a health problem that would prevent you from having lung cancer treatment. Breast Cancer  Practice breast self-awareness. This means understanding how your breasts normally appear and feel.  It also means doing regular breast self-exams. Let your health care provider know about any changes, no matter how small.  If you are in your 20s or 30s, you should have a clinical breast exam (CBE) by a health care provider every 1-3 years as part of a regular health exam.  If you are 80 or older, have a CBE every year. Also consider having a breast X-ray (mammogram) every year.  If you have a family history of breast cancer, talk to your health care  provider about genetic screening.  If you are at high risk for breast cancer, talk to your health care provider about having an MRI and a mammogram every year.  Breast cancer gene (BRCA) assessment is recommended for women who have family members with BRCA-related cancers. BRCA-related cancers include:  Breast.  Ovarian.  Tubal.  Peritoneal cancers.  Results of the assessment will determine the need for genetic counseling and BRCA1 and BRCA2 testing. Cervical Cancer  Your health care provider may recommend that you be screened regularly  for cancer of the pelvic organs (ovaries, uterus, and vagina). This screening involves a pelvic examination, including checking for microscopic changes to the surface of your cervix (Pap test). You may be encouraged to have this screening done every 3 years, beginning at age 69.  For women ages 33-65, health care providers may recommend pelvic exams and Pap testing every 3 years, or they may recommend the Pap and pelvic exam, combined with testing for human papilloma virus (HPV), every 5 years. Some types of HPV increase your risk of cervical cancer. Testing for HPV may also be done on women of any age with unclear Pap test results.  Other health care providers may not recommend any screening for nonpregnant women who are considered low risk for pelvic cancer and who do not have symptoms. Ask your health care provider if a screening pelvic exam is right for you.  If you have had past treatment for cervical cancer or a condition that could lead to cancer, you need Pap tests and screening for cancer for at least 20 years after your treatment. If Pap tests have been discontinued, your risk factors (such as having a new sexual partner) need to be reassessed to determine if screening should resume. Some women have medical problems that increase the chance of getting cervical cancer. In these cases, your health care provider may recommend more frequent screening and Pap tests. Colorectal Cancer  This type of cancer can be detected and often prevented.  Routine colorectal cancer screening usually begins at 81 years of age and continues through 81 years of age.  Your health care provider may recommend screening at an earlier age if you have risk factors for colon cancer.  Your health care provider may also recommend using home test kits to check for hidden blood in the stool.  A small camera at the end of a tube can be used to examine your colon directly (sigmoidoscopy or colonoscopy). This is done to check  for the earliest forms of colorectal cancer.  Routine screening usually begins at age 75.  Direct examination of the colon should be repeated every 5-10 years through 81 years of age. However, you may need to be screened more often if early forms of precancerous polyps or small growths are found. Skin Cancer  Check your skin from head to toe regularly.  Tell your health care provider about any new moles or changes in moles, especially if there is a change in a mole's shape or color.  Also tell your health care provider if you have a mole that is larger than the size of a pencil eraser.  Always use sunscreen. Apply sunscreen liberally and repeatedly throughout the day.  Protect yourself by wearing long sleeves, pants, a wide-brimmed hat, and sunglasses whenever you are outside. Heart disease, diabetes, and high blood pressure  High blood pressure causes heart disease and increases the risk of stroke. High blood pressure is more likely to develop in:  People who  have blood pressure in the high end of the normal range (130-139/85-89 mm Hg).  People who are overweight or obese.  People who are African American.  If you are 76-96 years of age, have your blood pressure checked every 3-5 years. If you are 47 years of age or older, have your blood pressure checked every year. You should have your blood pressure measured twice-once when you are at a hospital or clinic, and once when you are not at a hospital or clinic. Record the average of the two measurements. To check your blood pressure when you are not at a hospital or clinic, you can use:  An automated blood pressure machine at a pharmacy.  A home blood pressure monitor.  If you are between 69 years and 37 years old, ask your health care provider if you should take aspirin to prevent strokes.  Have regular diabetes screenings. This involves taking a blood sample to check your fasting blood sugar level.  If you are at a normal weight  and have a low risk for diabetes, have this test once every three years after 81 years of age.  If you are overweight and have a high risk for diabetes, consider being tested at a younger age or more often. Preventing infection Hepatitis B  If you have a higher risk for hepatitis B, you should be screened for this virus. You are considered at high risk for hepatitis B if:  You were born in a country where hepatitis B is common. Ask your health care provider which countries are considered high risk.  Your parents were born in a high-risk country, and you have not been immunized against hepatitis B (hepatitis B vaccine).  You have HIV or AIDS.  You use needles to inject street drugs.  You live with someone who has hepatitis B.  You have had sex with someone who has hepatitis B.  You get hemodialysis treatment.  You take certain medicines for conditions, including cancer, organ transplantation, and autoimmune conditions. Hepatitis C  Blood testing is recommended for:  Everyone born from [birth year] through 1963.  Anyone with known risk factors for hepatitis C. Sexually transmitted infections (STIs)  You should be screened for sexually transmitted infections (STIs) including gonorrhea and chlamydia if:  You are sexually active and are younger than 81 years of age.  You are older than 81 years of age and your health care provider tells you that you are at risk for this type of infection.  Your sexual activity has changed since you were last screened and you are at an increased risk for chlamydia or gonorrhea. Ask your health care provider if you are at risk.  If you do not have HIV, but are at risk, it may be recommended that you take a prescription medicine daily to prevent HIV infection. This is called pre-exposure prophylaxis (PrEP). You are considered at risk if:  You are sexually active and do not regularly use condoms or know the HIV status of your partner(s).  You take drugs by  injection.  You are sexually active with a partner who has HIV. Talk with your health care provider about whether you are at high risk of being infected with HIV. If you choose to begin PrEP, you should first be tested for HIV. You should then be tested every 3 months for as long as you are taking PrEP. Pregnancy  If you are premenopausal and you may become pregnant, ask your health care provider about preconception counseling.  If you may become pregnant, take 400 to 800 micrograms (mcg) of folic acid every day.  If you want to prevent pregnancy, talk to your health care provider about birth control (contraception). Osteoporosis and menopause  Osteoporosis is a disease in which the bones lose minerals and strength with aging. This can result in serious bone fractures. Your risk for osteoporosis can be identified using a bone density scan.  If you are 37 years of age or older, or if you are at risk for osteoporosis and fractures, ask your health care provider if you should be screened.  Ask your health care provider whether you should take a calcium or vitamin D supplement to lower your risk for osteoporosis.  Menopause may have certain physical symptoms and risks.  Hormone replacement therapy may reduce some of these symptoms and risks. Talk to your health care provider about whether hormone replacement therapy is right for you. Follow these instructions at home:  Schedule regular health, dental, and eye exams.  Stay current with your immunizations.  Do not use any tobacco products including cigarettes, chewing tobacco, or electronic cigarettes.  If you are pregnant, do not drink alcohol.  If you are breastfeeding, limit how much and how often you drink alcohol.  Limit alcohol intake to no more than 1 drink per day for nonpregnant women. One drink equals 12 ounces of beer, 5 ounces of wine, or 1 ounces of hard liquor.  Do not use street drugs.  Do not share needles.  Ask  your health care provider for help if you need support or information about quitting drugs.  Tell your health care provider if you often feel depressed.  Tell your health care provider if you have ever been abused or do not feel safe at home. This information is not intended to replace advice given to you by your health care provider. Make sure you discuss any questions you have with your health care provider. Document Released: 01/15/2011 Document Revised: 12/08/2015 Document Reviewed: 04/05/2015 Elsevier Interactive Patient Education  2017 [employer].

## 2016-10-25 NOTE — Assessment & Plan Note (Signed)
BP at goal off meds. Will continue to monitor for changes.

## 2016-10-25 NOTE — Progress Notes (Signed)
   Subjective:    Patient ID: Kimberly Aguirre, female    DOB: [birth year], 83 y.o.   MRN: [id number]  HPI Here for medicare wellness, no new complaints. Please see A/P for status and treatment of chronic medical problems.   HPI #2: here for follow up of her diabetes (still taking metformin, not complicated, not on ARB or statin, denies new numbness or weakness in her hands and feet), and her blood pressure (off meds for now and BP at goal, no headaches or chest pains or sob), and her hyperlipidemia (has not wanted to take meds in the past, goal LDL <100), and her anxiety (taking tranxene daily, we have discussed the risks and benefits of this in the past, she feels that this is a QOL issues and wishes to continue).   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 The patient's medical and social history - reviewed today no changes 2. Their use of alcohol, tobacco or illicit drugs 3. Their current medications and supplements 4. The patient's functional ability including ADL's, fall risks, home safety risks and hearing or visual impairment. 5. Diet and physical activities 6. Evidence for depression or mood disorders 7. Care team reviewed and updated (available in snapshot)  Review of Systems  Constitutional: Negative.   HENT: Negative.   Eyes: Positive for visual disturbance.  Respiratory: Negative for cough, chest tightness and shortness of breath.   Cardiovascular: Negative for chest pain, palpitations and leg swelling.  Gastrointestinal: Negative for abdominal distention, abdominal pain, constipation, diarrhea, nausea and  vomiting.  Musculoskeletal: Positive for arthralgias. Negative for back pain, gait problem, myalgias, neck pain and neck stiffness.  Skin: Negative.   Neurological: Negative.   Psychiatric/Behavioral: Negative.       Objective:   Physical Exam  Constitutional: She is oriented to person, place, and time. She appears well-developed and well-nourished.  HENT:  Head: Normocephalic and atraumatic.  Eyes: EOM are normal.  Neck: Normal range of motion.  Cardiovascular: Normal rate and regular rhythm.   Pulmonary/Chest: Effort normal and breath sounds normal. No respiratory distress. She has no wheezes. She has no rales.  Abdominal: Soft. Bowel sounds are normal. She exhibits no distension. There is no tenderness. There is no rebound.  Musculoskeletal: She exhibits no edema.  Neurological: She is alert and oriented to person, place, and time. Coordination normal.  Skin: Skin is warm and dry.  Psychiatric: She has a normal mood and affect.   Vitals:   10/25/16 0841  BP: 120/62  Pulse: 80  Resp: 12  Temp: 97.8 F (36.6 C)  TempSrc: Oral  SpO2: 97%  Weight: 127 lb (57.6 kg)  Height: 5\' 1"  (1.549 m)      Assessment & Plan:

## 2016-10-25 NOTE — Assessment & Plan Note (Signed)
Goal LDL <100 due to concurrent DM. Checking lipid panel and adjust as needed. Not currently on meds and managing with diet and exercise.

## 2016-10-25 NOTE — Assessment & Plan Note (Signed)
Uses tranxene daily for sleep/anxiety and feels that this is a QOL issue. Reminded about the risk of memory problems, increased risk of falls with long term usage and she is willing to accept these.

## 2016-10-25 NOTE — Progress Notes (Signed)
Pre visit review using our clinic review tool, if applicable. No additional management support is needed unless otherwise documented below in the visit note. 

## 2016-10-25 NOTE — Assessment & Plan Note (Signed)
Declines tetanus today, flu and pneumonia up to date. Foot exam done and checking labs. Aged out of colonoscopy and mammogram. Counseled on home safety and fall prevention. Given 10 year screening recommendations.

## 2016-10-25 NOTE — Assessment & Plan Note (Signed)
Checking HgA1c, microalbumin to creatinine ratio. Taking metformin 500 mg BID, not on ACE-I/ARB or statin and checking lipid panel as well. Foot exam done, eye exam up to date. Not complicated at this time.

## 2016-11-22 ENCOUNTER — Encounter: Payer: Self-pay | Admitting: Internal Medicine

## 2016-11-22 ENCOUNTER — Ambulatory Visit (INDEPENDENT_AMBULATORY_CARE_PROVIDER_SITE_OTHER): Payer: Medicare Other | Admitting: Internal Medicine

## 2016-11-22 DIAGNOSIS — J301 Allergic rhinitis due to pollen: Secondary | ICD-10-CM

## 2016-11-22 DIAGNOSIS — I10 Essential (primary) hypertension: Secondary | ICD-10-CM | POA: Diagnosis not present

## 2016-11-22 DIAGNOSIS — J309 Allergic rhinitis, unspecified: Secondary | ICD-10-CM | POA: Insufficient documentation

## 2016-11-22 HISTORY — DX: Allergic rhinitis, unspecified: J30.9

## 2016-11-22 NOTE — Assessment & Plan Note (Signed)
BP is at goal and reassurance given that she is not having stroke symptoms. Educated on FAST stroke symptoms and the need to go to ER immediately with these symptoms. Continue her off meds and monitor BP.

## 2016-11-22 NOTE — Patient Instructions (Signed)
It is okay to take zyrtec (also called cetirizine) for the allergies.   It is okay to use the tylenol for the headache if needed.

## 2016-11-22 NOTE — Progress Notes (Signed)
   Subjective:    Patient ID: Kimberly Aguirre, female    DOB: Sep 03, 1933, 83 y.o.   MRN: [id number]  HPI The patient is an 83 YO female coming in for headaches and sinus pressure. She is concerned about her blood pressure due to past history of stroke in both parents. She did have a headache for several days. Tried taking tylenol which was moderately effective. She was scared to take anything for allergies as she does not like to take any otc medications without consultation regarding her current medications. Mild ease to her headache and not currently present. Still with sinus pressure and drainage symptoms. She has been doing outdoor work.   Review of Systems  Constitutional: Negative for activity change, appetite change, chills, fatigue, fever and unexpected weight change.  HENT: Positive for congestion, postnasal drip, rhinorrhea, sinus pressure and voice change. Negative for ear discharge, ear pain, sinus pain, sore throat and trouble swallowing.   Eyes: Negative.   Respiratory: Negative.   Cardiovascular: Negative.   Gastrointestinal: Negative.   Neurological: Positive for headaches. Negative for dizziness, syncope, facial asymmetry, speech difficulty, weakness, light-headedness and numbness.      Objective:   Physical Exam  Constitutional: She is oriented to person, place, and time. She appears well-developed and well-nourished.  HENT:  Head: Normocephalic and atraumatic.  Right Ear: External ear normal.  Left Ear: External ear normal.  Mild frontal pressure, oropharynx with redness and clear drainage.   Eyes: EOM are normal. Pupils are equal, round, and reactive to light.  Neck: No JVD present.  Cardiovascular: Normal rate and regular rhythm.   Pulmonary/Chest: Effort normal and breath sounds normal.  Abdominal: Soft.  Lymphadenopathy:    She has no cervical adenopathy.  Neurological: She is alert and oriented to person, place, and time. No cranial nerve deficit. Coordination  normal.   Vitals:   11/22/16 0828  BP: 124/64  Pulse: 80  Resp: 12  Temp: 98 F (36.7 C)  TempSrc: Oral  SpO2: 99%  Weight: 125 lb 12 oz (57 kg)  Height: 5\' 1"  (1.549 m)      Assessment & Plan:

## 2016-11-22 NOTE — Assessment & Plan Note (Signed)
Advised to use zyrtec over the counter as safe with her other medications. She has no indication for antibiotics or steroids at this time and she is agreeable with plan and understands indication for return.

## 2016-12-05 ENCOUNTER — Encounter: Payer: Self-pay | Admitting: Podiatry

## 2016-12-05 ENCOUNTER — Ambulatory Visit (INDEPENDENT_AMBULATORY_CARE_PROVIDER_SITE_OTHER): Payer: Medicare Other | Admitting: Podiatry

## 2016-12-05 DIAGNOSIS — E1151 Type 2 diabetes mellitus with diabetic peripheral angiopathy without gangrene: Secondary | ICD-10-CM | POA: Diagnosis not present

## 2016-12-05 DIAGNOSIS — M79676 Pain in unspecified toe(s): Secondary | ICD-10-CM

## 2016-12-05 DIAGNOSIS — B351 Tinea unguium: Secondary | ICD-10-CM

## 2016-12-05 NOTE — Progress Notes (Signed)
Patient ID: Kimberly Aguirre, female   DOB: May 09, 1934, 83 y.o.   MRN: [id number]    Subjective: This patient presents for scheduled visit again complaining of elongated and thickened toenails which are uncomfortable and walking wearing shoes and requests toenail debridement  Objective: Orientated 3 DP pulses 2/4 bilaterally PT pulses 1/4 bilaterally Capillary reflex immediate bilaterally Sensation to 10 g monofilament wire intact 4/5 right and 5/5 left Vibratory sensation reactive bilaterally Ankle reflex equal reactive bilaterally HAV bilaterally Hammertoe fourth bilaterally No open skin lesions bilaterally Atrophic skin with absent hair growth bilaterally The toenails are hypertrophic, elongated, discolored, deformed and tender to direct palpation 6-10 Minimal keratoses dorsal PIPJ fourth toes bilaterally  Assessment: Symptomatic onychomycoses 6-10 Protective sensation intact bilaterally Diabeticwith decreased posterior tibial pulses without any open lesions or pre-ulcerative lesions Diabetic peripheral arterial disease associated with decreased posterior tibial pulses  Plan: Debridement toenails 6-10 mechanically and electronically without any bleeding  Reappoint 3 months

## 2016-12-05 NOTE — Patient Instructions (Signed)

## 2016-12-06 ENCOUNTER — Other Ambulatory Visit: Payer: Self-pay | Admitting: Internal Medicine

## 2016-12-14 ENCOUNTER — Other Ambulatory Visit: Payer: Self-pay | Admitting: Internal Medicine

## 2016-12-27 ENCOUNTER — Other Ambulatory Visit: Payer: Self-pay | Admitting: Internal Medicine

## 2016-12-27 DIAGNOSIS — Z1231 Encounter for screening mammogram for malignant neoplasm of breast: Secondary | ICD-10-CM

## 2017-01-28 ENCOUNTER — Ambulatory Visit
Admission: RE | Admit: 2017-01-28 | Discharge: 2017-01-28 | Disposition: A | Discharge status: Home / Self Care | Payer: Medicare Other | Source: Ambulatory Visit | Attending: Internal Medicine | Admitting: Internal Medicine

## 2017-01-28 DIAGNOSIS — Z1231 Encounter for screening mammogram for malignant neoplasm of breast: Secondary | ICD-10-CM | POA: Diagnosis not present

## 2017-03-06 ENCOUNTER — Ambulatory Visit (INDEPENDENT_AMBULATORY_CARE_PROVIDER_SITE_OTHER): Payer: Medicare Other | Admitting: Podiatry

## 2017-03-06 ENCOUNTER — Encounter: Payer: Self-pay | Admitting: Podiatry

## 2017-03-06 DIAGNOSIS — M79676 Pain in unspecified toe(s): Secondary | ICD-10-CM

## 2017-03-06 DIAGNOSIS — B351 Tinea unguium: Secondary | ICD-10-CM

## 2017-03-06 DIAGNOSIS — E1151 Type 2 diabetes mellitus with diabetic peripheral angiopathy without gangrene: Secondary | ICD-10-CM

## 2017-03-06 NOTE — Patient Instructions (Signed)

## 2017-03-06 NOTE — Progress Notes (Signed)
Patient ID: Kimberly Aguirre, female   DOB: 08/15/1933, 83 y.o.   MRN: [id number]    Subjective: This patient presents for scheduled visit again complaining of elongated and thickened toenails which are uncomfortable and walking wearing shoes and requests toenail debridement. Patient also complains of nighttime burning in her feet and self treatment with an unknown topical medication at night which reduces some of the burning  Objective: Orientated 3 DP pulses 2/4 bilaterally PT pulses 1/4 bilaterally Capillary reflex immediate bilaterally Sensation to 10 g monofilament wire intact 4/5 right and 5/5 left Vibratory sensation reactive bilaterally Ankle reflex equal reactive bilaterally HAV bilaterally Hammertoe fourth bilaterally No open skin lesions bilaterally Atrophic skin with absent hair growth bilaterally The toenails are hypertrophic, elongated, discolored, deformed and tender to direct palpation 6-10 Minimal keratoses dorsal PIPJ fourth toes bilaterally  Assessment: Symptomatic onychomycoses 6-10 Protective sensation intact bilaterally Diabetic peripheral neuropathy associated with complaint of burning feet Diabeticwith decreased posterior tibial pulses without any open lesions or pre-ulcerative lesions Diabetic peripheral arterial disease associated with decreased posterior tibial pulses  Plan: Debridement toenails 6-10 mechanically and electronically without any bleeding  Reappoint 3 months   

## 2017-03-07 ENCOUNTER — Emergency Department (HOSPITAL_COMMUNITY)
Admission: EM | Admit: 2017-03-07 | Discharge: 2017-03-07 | Disposition: A | Discharge status: Home / Self Care | Payer: Medicare Other | Attending: Emergency Medicine | Admitting: Emergency Medicine

## 2017-03-07 ENCOUNTER — Encounter (HOSPITAL_COMMUNITY): Payer: Self-pay | Admitting: Emergency Medicine

## 2017-03-07 ENCOUNTER — Emergency Department (HOSPITAL_COMMUNITY): Payer: Medicare Other

## 2017-03-07 ENCOUNTER — Ambulatory Visit (INDEPENDENT_AMBULATORY_CARE_PROVIDER_SITE_OTHER)
Admission: EM | Admit: 2017-03-07 | Discharge: 2017-03-07 | Disposition: A | Discharge status: Home / Self Care | Payer: Medicare Other | Source: Home / Self Care | Attending: Emergency Medicine | Admitting: Emergency Medicine

## 2017-03-07 DIAGNOSIS — Z79899 Other long term (current) drug therapy: Secondary | ICD-10-CM | POA: Insufficient documentation

## 2017-03-07 DIAGNOSIS — M79602 Pain in left arm: Secondary | ICD-10-CM | POA: Diagnosis not present

## 2017-03-07 DIAGNOSIS — Z7984 Long term (current) use of oral hypoglycemic drugs: Secondary | ICD-10-CM | POA: Diagnosis not present

## 2017-03-07 DIAGNOSIS — I700 Atherosclerosis of aorta: Secondary | ICD-10-CM | POA: Diagnosis not present

## 2017-03-07 DIAGNOSIS — R079 Chest pain, unspecified: Secondary | ICD-10-CM

## 2017-03-07 DIAGNOSIS — R072 Precordial pain: Secondary | ICD-10-CM | POA: Diagnosis not present

## 2017-03-07 DIAGNOSIS — Z7982 Long term (current) use of aspirin: Secondary | ICD-10-CM | POA: Insufficient documentation

## 2017-03-07 DIAGNOSIS — E119 Type 2 diabetes mellitus without complications: Secondary | ICD-10-CM | POA: Insufficient documentation

## 2017-03-07 LAB — BASIC METABOLIC PANEL
ANION GAP: 9 (ref 5–15)
BUN: 16 mg/dL (ref 6–20)
CALCIUM: 9.5 mg/dL (ref 8.9–10.3)
CHLORIDE: 100 mmol/L — AB (ref 101–111)
CO2: 27 mmol/L (ref 22–32)
CREATININE: 0.74 mg/dL (ref 0.44–1.00)
GFR calc non Af Amer: >60 mL/min (ref >60)
Glucose, Bld: 311 mg/dL — ABNORMAL HIGH (ref 65–99)
Potassium: 3.9 mmol/L (ref 3.5–5.1)
SODIUM: 136 mmol/L (ref 135–145)

## 2017-03-07 LAB — CBC
HCT: 41.3 % (ref 36.0–46.0)
Hemoglobin: 13.3 g/dL (ref 12.0–15.0)
MCH: 29.3 pg (ref 26.0–34.0)
MCHC: 32.2 g/dL (ref 30.0–36.0)
MCV: 91 fL (ref 78.0–100.0)
PLATELETS: 265 10*3/uL (ref 150–400)
RBC: 4.54 MIL/uL (ref 3.87–5.11)
RDW: 12.8 % (ref 11.5–15.5)
WBC: 8 10*3/uL (ref 4.0–10.5)

## 2017-03-07 LAB — I-STAT TROPONIN, ED
TROPONIN I, POC: 0 ng/mL (ref 0.00–0.08)
TROPONIN I, POC: 0 ng/mL (ref 0.00–0.08)

## 2017-03-07 MED ORDER — ASPIRIN 81 MG PO CHEW: 324.0000 mg | CHEWABLE_TABLET | Freq: Once | ORAL | Status: DC

## 2017-03-07 NOTE — Discharge Instructions (Signed)

## 2017-03-07 NOTE — ED Provider Notes (Signed)
[facility] DEPT Provider Note   CSN: [id number] Arrival date & time: 03/07/17  1503     History   Chief Complaint Chief Complaint  Patient presents with  . Arm Pain    HPI Kimberly Aguirre is a 83 y.o. female.  HPI Pt with hx of DM, atypical chest pain and neg nuclear stress in 2014 comes in with cc of chest pain. Pt reports that she has been having intermittent chest discomfort for the past 4 days. Chest pain is sharp. She also has intermittent L arm discomfort, and it hard for her to describe the discomfort. The 2 pains are not always related. Pt has no radiation of her pain. She denies any nausea, dib, palpitations, dizziness. Pain is not exertional and there is no specific aggravating or relieving factors.  Past Medical History:  Diagnosis Date  . Anxiety   . Blindness of left eye   . Chest pain, atypical   . Dermatitis   . Diabetes mellitus   . DJD (degenerative joint disease)   . Headache(784.0)   . Hiatal hernia   . Hx of colonic polyps   . IBS (irritable bowel syndrome)   . Osteopenia   . Overactive bladder   . Venous insufficiency     Patient Active Problem List   Diagnosis Date Noted  . Allergic rhinitis 11/22/2016  . Routine general medical examination at a health care facility 06/15/2015  . Essential hypertension 08/15/2010  . OVERACTIVE BLADDER 02/13/2010  . OSTEOPENIA 10/25/2007  . Diabetes mellitus type 2 in obese ([place]) 06/19/2007  . Hyperlipidemia 06/19/2007  . Anxiety state 06/19/2007  . BLINDNESS, LEFT EYE 06/19/2007  . IRRITABLE BOWEL SYNDROME 06/19/2007  . DEGENERATIVE JOINT DISEASE 06/19/2007    Past Surgical History:  Procedure Laterality Date  . EYE SURGERY     age 62  . VAGINAL HYSTERECTOMY     and ureteral sling 06/2010 by Dr. Cristine Polio    OB History    No data available       Home Medications    Prior to Admission medications   Medication Sig Start Date End Date Taking? Authorizing Provider  aspirin 81 MG tablet  Take 81 mg by mouth daily as needed for pain.    Yes [provider]  clorazepate (TRANXENE) 7.5 MG tablet take 1 tablet by mouth once daily if needed 12/06/16  Yes Hoyt Koch, MD  diclofenac sodium (VOLTAREN) 1 % GEL Apply 2 g topically 3 (three) times daily as needed (pain). 06/16/15  Yes Hoyt Koch, MD  fish oil-omega-3 fatty acids 1000 MG capsule Take 1 g by mouth as needed.    Yes [provider]  metFORMIN (GLUCOPHAGE) 500 MG tablet take 1 tablet by mouth twice a day with food Patient taking differently: take 576m by mouth twice a day with food 10/15/16  Yes CHoyt Koch MD  Multiple Vitamins-Minerals (CENTRUM SILVER PO) Take 1 tablet by mouth daily.     Yes [provider]  ACCU-CHEK AVIVA PLUS test strip USE TO CHECK BLOOD SUGARS 2 TIMES A DAY 12/14/16   CHoyt Koch MD  Blood Glucose Monitoring Suppl (ACCU-CHEK AVIVA PLUS) W/DEVICE KIT 1 Device by Does not apply route as directed. 03/22/11   NNoralee Space MD  Lancets (ACCU-CHEK MULTICLIX) lancets Use as directed to check blood sugar twice daily 02/22/16   CHoyt Koch MD    Family History Family History  Problem Relation Age of Onset  . Diabetes Mother   .  Diabetes Father   . CAD Neg Hx     Social History Social History  Substance Use Topics  . Smoking status: Never Smoker  . Smokeless tobacco: Never Used  . Alcohol use No     Allergies   Codeine   Review of Systems Review of Systems  All other systems reviewed and are negative.    Physical Exam Updated Vital Signs BP 135/81 (BP Location: Right Arm)   Pulse 80   Temp 98 F (36.7 C) (Oral)   Resp 16   Ht 5' 1"  (1.549 m)   Wt 54.4 kg (120 lb)   SpO2 100%   BMI 22.67 kg/m   Physical Exam  Constitutional: She is oriented to person, place, and time. She appears well-developed.  HENT:  Head: Normocephalic and atraumatic.  Eyes: EOM are normal.  Neck: Normal range of motion. Neck supple.   No midline c-spine tenderness, pt able to turn head to 45 degrees bilaterally without any pain and able to flex neck to the chest and extend without any pain or neurologic symptoms.   Cardiovascular: Normal rate and intact distal pulses.   Pulmonary/Chest: Effort normal.  Abdominal: Bowel sounds are normal.  Neurological: She is alert and oriented to person, place, and time.  Skin: Skin is warm and dry.  Nursing note and vitals reviewed.    ED Treatments / Results  Labs (all labs ordered are listed, but only abnormal results are displayed) Labs Reviewed  BASIC METABOLIC PANEL - Abnormal; Notable for the following:       Result Value   Chloride 100 (*)    Glucose, Bld 311 (*)    All other components within normal limits  CBC  I-STAT TROPONIN, ED  I-STAT TROPONIN, ED    EKG  EKG Interpretation  Date/Time:  Thursday March 07 2017 15:13:59 EDT Ventricular Rate:  83 PR Interval:  122 QRS Duration: 70 QT Interval:  380 QTC Calculation: 446 R Axis:   38 Text Interpretation:  Normal sinus rhythm Possible Left atrial enlargement Borderline ECG No acute changes No significant change since last tracing Confirmed by Varney Biles [phone]) on 03/07/2017 8:05:47 PM       Radiology Dg Chest 2 View  Result Date: 03/07/2017 CLINICAL DATA:  One week of intermittent left arm discomfort as well as left upper quadrant discomfort. History of diabetes, hypertension, nonsmoker. EXAM: CHEST  2 VIEW COMPARISON:  PA and lateral chest x-ray of October 01, 2013 FINDINGS: The lungs are adequately inflated and clear. The heart and pulmonary vascularity are normal. The mediastinum is normal in width. There is calcification in the wall of the aortic arch. The bony thorax exhibits no acute abnormality. IMPRESSION: There is no acute cardiopulmonary abnormality. Thoracic aortic atherosclerosis. Electronically Signed   By: David  Martinique M.D.   On: 03/07/2017 15:47    Procedures Procedures (including  critical care time)  Medications Ordered in ED Medications - No data to display   Initial Impression / Assessment and Plan / ED Course  I have reviewed the triage vital signs and the nursing notes.  Pertinent labs & imaging results that were available during my care of the patient were reviewed by me and considered in my medical decision making (see chart for details).     Pt comes in with cc of chest discomfort and arm pain. She has no numbness / tingling in the arm and the discomfort doesn't appear to be radicular type in nature.  Chest pain and arm pain  are atypical of ischemia, but she is diabetic. Plan is to get trops x 2, if neg, we will have her see Cards.  I have called [facility] to set up appointment.  Results from the ER workup discussed with the patient face to face and all questions answered to the best of my ability.  Strict ER return precautions have been discussed, and patient is agreeing with the plan and is comfortable with the workup done and the recommendations from the ER.   Final Clinical Impressions(s) / ED Diagnoses   Final diagnoses:  Precordial chest pain  Left arm pain    New Prescriptions New Prescriptions   No medications on file     Varney Biles, MD 03/07/17 2043

## 2017-03-07 NOTE — Discharge Instructions (Signed)
We will take you down to the ER via wheelchair so that you will get there quickly. Let them know about the chest pain that you've been having for the past several days and your left shoulder/upper arm discomfort.

## 2017-03-07 NOTE — ED Provider Notes (Addendum)
HPI  SUBJECTIVE:  Kimberly Aguirre is a 83 y.o. female who presents with 2-3 days of left-sided sharp intermittent chest pain. She states it does not radiate to her neck, back or down her arm. However she does report left shoulder and upper arm pressure or heaviness. This is also intermittent, lasting several seconds. It is not becoming more frequent or intense but the patient is becoming increasingly worried about it. There are no alleviating factors. She has not tried anything for this. Symptoms are worse with stress. She denies nausea, diaphoresis, shortness of breath, palpitations, lightheadedness, dizziness, presyncope, syncope. There does not appear to be any exertional  Or positional component to this. No coughing, wheezing, indigestion. No arm or leg weakness, facial droop, slurred speech, aphasia, blurry vision, double vision. She has never had symptoms like this before. She has not taken an aspirin today. She has a past medical history of diabetes, hypertension. She was never a smoker. No history of MI, coronary disease, atrial fibrillation, arrhythmia, hypercholesterolemia. Family history negative for MI, positive for strokes in mother and multiple siblings. PMD: Hoyt Koch, MD   Past Medical History:  Diagnosis Date  . Anxiety   . Blindness of left eye   . Chest pain, atypical   . Dermatitis   . Diabetes mellitus   . DJD (degenerative joint disease)   . Headache(784.0)   . Hiatal hernia   . Hx of colonic polyps   . IBS (irritable bowel syndrome)   . Osteopenia   . Overactive bladder   . Venous insufficiency     Past Surgical History:  Procedure Laterality Date  . EYE SURGERY     age 86  . VAGINAL HYSTERECTOMY     and ureteral sling 06/2010 by Dr. Cristine Polio    Family History  Problem Relation Age of Onset  . Diabetes Mother   . Diabetes Father   . CAD Neg Hx     Social History  Substance Use Topics  . Smoking status: Never Smoker  . Smokeless  tobacco: Never Used  . Alcohol use No     Current Facility-Administered Medications:  .  aspirin chewable tablet 324 mg, 324 mg, Oral, Once, Melynda Ripple, MD  Current Outpatient Prescriptions:  .  ACCU-CHEK AVIVA PLUS test strip, USE TO CHECK BLOOD SUGARS 2 TIMES A DAY, Disp: 100 each, Rfl: 11 .  aspirin 81 MG tablet, Take 81 mg by mouth daily.  , Disp: , Rfl:  .  Blood Glucose Monitoring Suppl (ACCU-CHEK AVIVA PLUS) W/DEVICE KIT, 1 Device by Does not apply route as directed., Disp: 1 kit, Rfl: 0 .  clorazepate (TRANXENE) 7.5 MG tablet, take 1 tablet by mouth once daily if needed, Disp: 90 tablet, Rfl: 1 .  diclofenac sodium (VOLTAREN) 1 % GEL, Apply 2 g topically 3 (three) times daily as needed (pain)., Disp: 100 g, Rfl: 6 .  fish oil-omega-3 fatty acids 1000 MG capsule, Take 1 g by mouth daily.  , Disp: , Rfl:  .  Lancets (ACCU-CHEK MULTICLIX) lancets, Use as directed to check blood sugar twice daily, Disp: 204 each, Rfl: 3 .  metFORMIN (GLUCOPHAGE) 500 MG tablet, take 1 tablet by mouth twice a day with food, Disp: 180 tablet, Rfl: 0 .  Multiple Vitamins-Minerals (CENTRUM SILVER PO), Take 1 tablet by mouth daily.  , Disp: , Rfl:   Allergies  Allergen Reactions  . Codeine     REACTION: nausea     ROS  As noted in HPI.  Physical Exam  BP (!) 189/76 (BP Location: Left Arm)   Pulse 93   Temp 98.3 F (36.8 C) (Oral)   Resp 16   Ht 5' 1" (1.549 m)   Wt 120 lb (54.4 kg)   SpO2 98%   BMI 22.67 kg/m   Constitutional: Well developed, well nourished, no acute distress Eyes:  EOMI, conjunctiva normal bilaterally HENT: Normocephalic, atraumatic,mucus membranes moist Respiratory: Normal inspiratory effort lungs clear bilaterally, good air movement Cardiovascular: Normal rateRegular rhythm no murmurs rubs or gallops GI: nondistended normal bowel sounds skin: No rash, skin intact Musculoskeletal: no deformities, no edema, tenderness Neurologic: Alert & oriented x 3, no  focal neuro deficits, clear speech Psychiatric: Speech and behavior appropriate   ED Course   Medications  aspirin chewable tablet 324 mg (not administered)    Orders Placed This Encounter  Procedures  . ED EKG    Standing Status:   Standing    Number of Occurrences:   1    Order Specific Question:   Reason for Exam    Answer:   Chest Pain    No results found for this or any previous visit (from the past 24 hour(s)). No results found.  ED Clinical Impression  Chest pain, unspecified type   ED Assessment/Plan  EKG: Normal sinus rhythm, rate 91. Normal axis. Normal intervals. No ventricular hypertrophy. No ST-T wave changes compared to EKG from 2014.  Concern for atypical chest pain given patient's presentation despite normal initial EKG. She has multiple cardiac risk factors and has never had symptoms like this before. 324 mg of aspirin ordered but was not given here by accident- pt taken to ED prior to med administration. Sending to the ED for comprehensive workup. Sending via wheelchair she has no ischemic changes on EKG.  Discussed MDM, rationale for transfer to the ED. Patient and family member agree with plan.  Meds ordered this encounter  Medications  . aspirin chewable tablet 324 mg    *This clinic note was created using [employer]. Therefore, there may be occasional mistakes despite careful proofreading.  ?   Melynda Ripple, MD 03/07/17 1459    Melynda Ripple, MD 03/07/17 [phone]

## 2017-03-07 NOTE — ED Triage Notes (Signed)
Patient reports discomfort in left arm, intermittent discomfort, denies "pain"  Also has had discomfort under left breast, intermittently.  Denies a history of chest or cardiac issues.  No diaphoresis, no sob.    erin phelps, pa notified and spoke to patient at in take room. Patient and family member have chosen to stay

## 2017-03-07 NOTE — ED Triage Notes (Signed)
Pt endorses intermittent left arm discomfort x 1 week. Pt also states that she has had some LUQ abd pain over same time. Pain free at this time. Sent from [facility] for cardiac evaluation. EKG at [facility] was normal. VSS.

## 2017-03-08 ENCOUNTER — Other Ambulatory Visit: Payer: Self-pay | Admitting: Internal Medicine

## 2017-03-12 ENCOUNTER — Ambulatory Visit (INDEPENDENT_AMBULATORY_CARE_PROVIDER_SITE_OTHER): Payer: Medicare Other | Admitting: Nurse Practitioner

## 2017-03-12 ENCOUNTER — Encounter: Payer: Self-pay | Admitting: Nurse Practitioner

## 2017-03-12 VITALS — BP 128/60 | HR 88 | Temp 97.6°F | Ht 61.0 in | Wt 125.0 lb

## 2017-03-12 DIAGNOSIS — E669 Obesity, unspecified: Secondary | ICD-10-CM | POA: Diagnosis not present

## 2017-03-12 DIAGNOSIS — E1169 Type 2 diabetes mellitus with other specified complication: Secondary | ICD-10-CM | POA: Diagnosis not present

## 2017-03-12 LAB — POCT GLYCOSYLATED HEMOGLOBIN (HGB A1C)

## 2017-03-12 MED ORDER — GLIPIZIDE ER 5 MG PO TB24: 5.0000 mg | ORAL_TABLET | Freq: Every day | ORAL | 2 refills | Status: DC

## 2017-03-12 NOTE — Progress Notes (Signed)
Subjective:  Patient ID: Kimberly Aguirre, female    DOB: 11-04-1933  Age: 83 y.o. MRN: [id number]  CC: Hospitalization Follow-up (hospital f/u for chest pain---heart doctore referral? last time saw heart doc 2014/ blood sugar consult?)   HPI  DM: Home glucose reading AM 160s-200s  Current use of metformin only. No hypoglycemia  No GI side effects with metformin Exercises daily. Denies any change in diet  Chest pain: Canceled appt with cardiology because she wants to talk to pcp first. LUQ pain and left chest wall pain radiating to left arm. Onset while eating at a restaurant with son. (beef meal). Chest pain has resolved since ED visit. No GERD.  Outpatient Medications Prior to Visit  Medication Sig Dispense Refill  . ACCU-CHEK AVIVA PLUS test strip USE TO CHECK BLOOD SUGARS 2 TIMES A DAY 100 each 11  . aspirin 81 MG tablet Take 81 mg by mouth daily as needed for pain.     . Blood Glucose Monitoring Suppl (ACCU-CHEK AVIVA PLUS) W/DEVICE KIT 1 Device by Does not apply route as directed. 1 kit 0  . clorazepate (TRANXENE) 7.5 MG tablet take 1 tablet by mouth once daily if needed 90 tablet 1  . diclofenac sodium (VOLTAREN) 1 % GEL Apply 2 g topically 3 (three) times daily as needed (pain). 100 g 6  . fish oil-omega-3 fatty acids 1000 MG capsule Take 1 g by mouth as needed.     . Lancets (ACCU-CHEK MULTICLIX) lancets Use as directed to check blood sugar twice daily 204 each 3  . metFORMIN (GLUCOPHAGE) 500 MG tablet take 1 tablet by mouth twice a day with food 180 tablet 0  . Multiple Vitamins-Minerals (CENTRUM SILVER PO) Take 1 tablet by mouth daily.       No facility-administered medications prior to visit.     ROS See HPI  Objective:  BP 128/60   Pulse 88   Temp 97.6 F (36.4 C)   Ht 5' 1"  (1.549 m)   Wt 125 lb (56.7 kg)   SpO2 97%   BMI 23.62 kg/m   BP Readings from Last 3 Encounters:  03/12/17 128/60  03/07/17 (!) 151/74  03/07/17 (!) 189/76    Wt Readings from  Last 3 Encounters:  03/12/17 125 lb (56.7 kg)  03/07/17 120 lb (54.4 kg)  03/07/17 120 lb (54.4 kg)    Physical Exam  Constitutional: She is oriented to person, place, and time. No distress.  Neck: No JVD present.  Cardiovascular: Normal rate, regular rhythm and normal heart sounds.   Pulmonary/Chest: Effort normal and breath sounds normal.  Abdominal: Soft. Bowel sounds are normal. She exhibits no distension. There is no tenderness. There is no rebound and no guarding.  Neurological: She is alert and oriented to person, place, and time.  Skin: Skin is warm and dry.  Psychiatric: She has a normal mood and affect. Her behavior is normal. Thought content normal.  Vitals reviewed.   Lab Results  Component Value Date   WBC 8.0 03/07/2017   HGB 13.3 03/07/2017   HCT 41.3 03/07/2017   PLT 265 03/07/2017   GLUCOSE 311 (H) 03/07/2017   CHOL 200 10/25/2016   TRIG 149.0 10/25/2016   HDL 50.80 10/25/2016   LDLDIRECT 157.3 08/17/2010   LDLCALC 119 (H) 10/25/2016   ALT 13 10/25/2016   AST 18 10/25/2016   NA 136 03/07/2017   K 3.9 03/07/2017   CL 100 (L) 03/07/2017   CREATININE 0.74 03/07/2017   BUN  16 03/07/2017   CO2 27 03/07/2017   TSH 0.37 12/13/2015   HGBA1C 9.4% 03/12/2017   MICROALBUR 1.0 10/25/2016    Dg Chest 2 View  Result Date: 03/07/2017 CLINICAL DATA:  One week of intermittent left arm discomfort as well as left upper quadrant discomfort. History of diabetes, hypertension, nonsmoker. EXAM: CHEST  2 VIEW COMPARISON:  PA and lateral chest x-ray of October 01, 2013 FINDINGS: The lungs are adequately inflated and clear. The heart and pulmonary vascularity are normal. The mediastinum is normal in width. There is calcification in the wall of the aortic arch. The bony thorax exhibits no acute abnormality. IMPRESSION: There is no acute cardiopulmonary abnormality. Thoracic aortic atherosclerosis. Electronically Signed   By: David  Martinique M.D.   On: 03/07/2017 15:47    Assessment  & Plan:   Kimberly Aguirre was seen today for hospitalization follow-up.  Diagnoses and all orders for this visit:  Diabetes mellitus type 2 in obese ([place]) -     POCT glycosylated hemoglobin (Hb A1C) -     glipiZIDE (GLUCOTROL XL) 5 MG 24 hr tablet; Take 1 tablet (5 mg total) by mouth daily with breakfast.   I am having Kimberly Aguirre start on glipiZIDE. I am also having her maintain her aspirin, Multiple Vitamins-Minerals (CENTRUM SILVER PO), fish oil-omega-3 fatty acids, ACCU-CHEK AVIVA PLUS, diclofenac sodium, accu-chek multiclix, clorazepate, ACCU-CHEK AVIVA PLUS, and metFORMIN.  Meds ordered this encounter  Medications  . glipiZIDE (GLUCOTROL XL) 5 MG 24 hr tablet    Sig: Take 1 tablet (5 mg total) by mouth daily with breakfast.    Dispense:  30 tablet    Refill:  2    Order Specific Question:   Supervising Provider    Answer:   Cassandria Anger [[id number]]    Follow-up: Return in about 3 months (around 06/12/2017) for with Dr. Sharlet Salina.  Wilfred Lacy, NP

## 2017-03-12 NOTE — Patient Instructions (Signed)
Continue glucose check once a day.  Diabetes Mellitus and Food It is important for you to manage your blood sugar (glucose) level. Your blood glucose level can be greatly affected by what you eat. Eating healthier foods in the appropriate amounts throughout the day at about the same time each day will help you control your blood glucose level. It can also help slow or prevent worsening of your diabetes mellitus. Healthy eating may even help you improve the level of your blood pressure and reach or maintain a healthy weight. General recommendations for healthful eating and cooking habits include:  Eating meals and snacks regularly. Avoid going long periods of time without eating to lose weight.  Eating a diet that consists mainly of plant-based foods, such as fruits, vegetables, nuts, legumes, and whole grains.  Using low-heat cooking methods, such as baking, instead of high-heat cooking methods, such as deep frying.  Work with your dietitian to make sure you understand how to use the Nutrition Facts information on food labels. How can food affect me? Carbohydrates Carbohydrates affect your blood glucose level more than any other type of food. Your dietitian will help you determine how many carbohydrates to eat at each meal and teach you how to count carbohydrates. Counting carbohydrates is important to keep your blood glucose at a healthy level, especially if you are using insulin or taking certain medicines for diabetes mellitus. Alcohol Alcohol can cause sudden decreases in blood glucose (hypoglycemia), especially if you use insulin or take certain medicines for diabetes mellitus. Hypoglycemia can be a life-threatening condition. Symptoms of hypoglycemia (sleepiness, dizziness, and disorientation) are similar to symptoms of having too much alcohol. If your health care provider has given you approval to drink alcohol, do so in moderation and use the following guidelines:  Women should not have  more than one drink per day, and men should not have more than two drinks per day. One drink is equal to: ? 12 oz of beer. ? 5 oz of wine. ? 1 oz of hard liquor.  Do not drink on an empty stomach.  Keep yourself hydrated. Have water, diet soda, or unsweetened iced tea.  Regular soda, juice, and other mixers might contain a lot of carbohydrates and should be counted.  What foods are not recommended? As you make food choices, it is important to remember that all foods are not the same. Some foods have fewer nutrients per serving than other foods, even though they might have the same number of calories or carbohydrates. It is difficult to get your body what it needs when you eat foods with fewer nutrients. Examples of foods that you should avoid that are high in calories and carbohydrates but low in nutrients include:  Trans fats (most processed foods list trans fats on the Nutrition Facts label).  Regular soda.  Juice.  Candy.  Sweets, such as cake, pie, doughnuts, and cookies.  Fried foods.  What foods can I eat? Eat nutrient-rich foods, which will nourish your body and keep you healthy. The food you should eat also will depend on several factors, including:  The calories you need.  The medicines you take.  Your weight.  Your blood glucose level.  Your blood pressure level.  Your cholesterol level.  You should eat a variety of foods, including:  Protein. ? Lean cuts of meat. ? Proteins low in saturated fats, such as fish, egg whites, and beans. Avoid processed meats.  Fruits and vegetables. ? Fruits and vegetables that may help  control blood glucose levels, such as apples, mangoes, and yams.  Dairy products. ? Choose fat-free or low-fat dairy products, such as milk, yogurt, and cheese.  Grains, bread, pasta, and rice. ? Choose whole grain products, such as multigrain bread, whole oats, and brown rice. These foods may help control blood pressure.  Fats. ? Foods  containing healthful fats, such as nuts, avocado, olive oil, canola oil, and fish.  Does everyone with diabetes mellitus have the same meal plan? Because every person with diabetes mellitus is different, there is not one meal plan that works for everyone. It is very important that you meet with a dietitian who will help you create a meal plan that is just right for you. This information is not intended to replace advice given to you by your health care provider. Make sure you discuss any questions you have with your health care provider. Document Released: 03/29/2005 Document Revised: 12/08/2015 Document Reviewed: 05/29/2013 Elsevier Interactive Patient Education  2017 [employer].

## 2017-04-02 ENCOUNTER — Ambulatory Visit: Payer: Medicare Other | Admitting: Cardiology

## 2017-04-11 ENCOUNTER — Ambulatory Visit: Payer: Medicare Other

## 2017-04-23 ENCOUNTER — Ambulatory Visit: Payer: Medicare Other

## 2017-06-04 ENCOUNTER — Ambulatory Visit (INDEPENDENT_AMBULATORY_CARE_PROVIDER_SITE_OTHER): Payer: Medicare Other | Admitting: Podiatry

## 2017-06-04 ENCOUNTER — Encounter: Payer: Self-pay | Admitting: Podiatry

## 2017-06-04 ENCOUNTER — Telehealth: Payer: Self-pay | Admitting: Internal Medicine

## 2017-06-04 DIAGNOSIS — E1151 Type 2 diabetes mellitus with diabetic peripheral angiopathy without gangrene: Secondary | ICD-10-CM | POA: Diagnosis not present

## 2017-06-04 DIAGNOSIS — E1142 Type 2 diabetes mellitus with diabetic polyneuropathy: Secondary | ICD-10-CM | POA: Diagnosis not present

## 2017-06-04 DIAGNOSIS — B351 Tinea unguium: Secondary | ICD-10-CM | POA: Diagnosis not present

## 2017-06-04 DIAGNOSIS — M79675 Pain in left toe(s): Secondary | ICD-10-CM | POA: Diagnosis not present

## 2017-06-04 DIAGNOSIS — M79674 Pain in right toe(s): Secondary | ICD-10-CM | POA: Diagnosis not present

## 2017-06-04 NOTE — Telephone Encounter (Signed)
Check [place] registry last filled 03/04/2017. Pt has f/u appt schedule for 06/12/17...Kimberly Aguirre

## 2017-06-04 NOTE — Patient Instructions (Signed)

## 2017-06-04 NOTE — Telephone Encounter (Signed)
Pt called for a refill of her clorazepate (TRANXENE) 7.5 MG tablet  Please advise she is going out of town tomorrow  POF

## 2017-06-04 NOTE — Progress Notes (Signed)
Patient ID: Kimberly Aguirre, female   DOB: [birth year], 83 y.o.   MRN: [id number]    Subjective: This patient presents for scheduled visit again complaining of elongated and thickened toenails which are uncomfortable and walking wearing shoes and requests toenail debridement. Patient also complains of nighttime burning in her feet and self treatment with an unknown topical medication at night which reduces some of the burning  Objective: Orientated 3 DP pulses 2/4 bilaterally PT pulses 1/4 bilaterally Capillary reflex immediate bilaterally Sensation to 10 g monofilament wire intact 4/5 right and 5/5 left Vibratory sensation reactive bilaterally Ankle reflex equal reactive bilaterally HAV bilaterally Hammertoe fourth bilaterally No open skin lesions bilaterally Atrophic skin with absent hair growth bilaterally The toenails are hypertrophic, elongated, discolored, deformed and tender to direct palpation 6-10 Minimal keratoses dorsal PIPJ fourth toes bilaterally  Assessment: Symptomatic onychomycoses 6-10 Protective sensation intact bilaterally Diabetic peripheral neuropathy associated with complaint of burning feet Diabeticwith decreased posterior tibial pulses without any open lesions or pre-ulcerative lesions Diabetic peripheral arterial disease associated with decreased posterior tibial pulses  Plan: Debridement toenails 6-10 mechanically and electronically without any bleeding  Reappoint 3 months

## 2017-06-05 MED ORDER — CLORAZEPATE DIPOTASSIUM 7.5 MG PO TABS: ORAL_TABLET | ORAL | 0 refills | Status: DC

## 2017-06-05 NOTE — Telephone Encounter (Signed)
Sent in today. In future she needs to allow 2-3 business days for refills so needs to call pharmacy sooner.

## 2017-06-05 NOTE — Telephone Encounter (Signed)
Pt called back checking on this.  She said that she requested the refill from the pharmacy on Monday.

## 2017-06-05 NOTE — Telephone Encounter (Signed)
Patient informed. 

## 2017-06-12 ENCOUNTER — Ambulatory Visit (INDEPENDENT_AMBULATORY_CARE_PROVIDER_SITE_OTHER): Payer: Medicare Other | Admitting: Internal Medicine

## 2017-06-12 ENCOUNTER — Encounter: Payer: Self-pay | Admitting: Internal Medicine

## 2017-06-12 ENCOUNTER — Other Ambulatory Visit (INDEPENDENT_AMBULATORY_CARE_PROVIDER_SITE_OTHER): Payer: Medicare Other

## 2017-06-12 VITALS — BP 118/70 | HR 90 | Temp 98.0°F | Ht 61.0 in | Wt 123.0 lb

## 2017-06-12 DIAGNOSIS — E118 Type 2 diabetes mellitus with unspecified complications: Secondary | ICD-10-CM | POA: Diagnosis not present

## 2017-06-12 DIAGNOSIS — L299 Pruritus, unspecified: Secondary | ICD-10-CM

## 2017-06-12 LAB — HEMOGLOBIN A1C: HEMOGLOBIN A1C: 9.2 % — AB (ref 4.6–6.5)

## 2017-06-12 NOTE — Progress Notes (Signed)
   Subjective:    Patient ID: Kimberly Aguirre, female    DOB: 1933-09-23, 83 y.o.   MRN: [id number]  HPI The patient is an 83 YO female coming in for ear itching. Going on for some time. She sometimes will use q-tips to help with itching. She denies using any creams or lotions. The itching is on the outside and inside. Denies hearing changes or tinnitus although she has some hearing loss from aging. Denies sinus pressure but is having some nose drainage currently.   Review of Systems  Constitutional: Negative.   HENT: Positive for ear pain and postnasal drip. Negative for ear discharge, rhinorrhea, sinus pain, tinnitus, trouble swallowing and voice change.   Eyes: Negative.   Respiratory: Negative for cough, chest tightness and shortness of breath.   Cardiovascular: Negative for chest pain, palpitations and leg swelling.  Gastrointestinal: Negative for abdominal distention, abdominal pain, constipation, diarrhea, nausea and vomiting.  Musculoskeletal: Negative.   Skin: Negative.   Neurological: Negative.       Objective:   Physical Exam  Constitutional: She is oriented to person, place, and time. She appears well-developed and well-nourished.  HENT:  Head: Normocephalic and atraumatic.  Right Ear: External ear normal.  Left Ear: External ear normal.  Eyes: EOM are normal.  Neck: Normal range of motion.  Cardiovascular: Normal rate and regular rhythm.  Pulmonary/Chest: Effort normal and breath sounds normal. No respiratory distress. She has no wheezes. She has no rales.  Abdominal: Soft. Bowel sounds are normal. She exhibits no distension. There is no tenderness. There is no rebound.  Musculoskeletal: She exhibits no edema.  Neurological: She is alert and oriented to person, place, and time. Coordination normal.  Skin: Skin is warm and dry.   Vitals:   06/12/17 1041  BP: 118/70  Pulse: 90  Temp: 98 F (36.7 C)  TempSrc: Oral  SpO2: 99%  Weight: 123 lb (55.8 kg)  Height: 5\' 1"   (1.549 m)      Assessment & Plan:

## 2017-06-12 NOTE — Patient Instructions (Signed)
We are checking the labs today and will get you in with the ear specialist.   Think about using vaseline on the ears to help with itching.

## 2017-06-14 DIAGNOSIS — L299 Pruritus, unspecified: Secondary | ICD-10-CM | POA: Insufficient documentation

## 2017-06-14 NOTE — Assessment & Plan Note (Signed)
Needs labs today and adjust her metformin as needed. Not well controlled at last check but she decided not to start glipizide as it made her gain weight previously. Checking Hga1c and adjust as needed.

## 2017-06-14 NOTE — Assessment & Plan Note (Signed)
Referral to ENT and encouraged her to use vaseline with her finger on the outside and avoid all q-tips.

## 2017-06-17 ENCOUNTER — Telehealth: Payer: Self-pay | Admitting: Internal Medicine

## 2017-06-17 NOTE — Telephone Encounter (Signed)
Please advise 

## 2017-06-17 NOTE — Telephone Encounter (Signed)
Telephone from patient. Reviewed lab result and physician note with patient.  She stated she would be willing to add another diabetic medication to her regimen, except glypizide which she has tried in the past and it gave her an enormous appetite. Please advise.

## 2017-06-18 DIAGNOSIS — L299 Pruritus, unspecified: Secondary | ICD-10-CM | POA: Diagnosis not present

## 2017-06-18 MED ORDER — PIOGLITAZONE HCL 15 MG PO TABS: 15.0000 mg | ORAL_TABLET | Freq: Every day | ORAL | 1 refills | Status: DC

## 2017-06-18 NOTE — Addendum Note (Signed)
Addended by: Pricilla Holm A on: 06/18/2017 02:00 PM   Modules accepted: Orders

## 2017-06-18 NOTE — Telephone Encounter (Signed)
LVM for patient informing that medication Actos was sent in to pharmacy and is to be added to her other diabetic medication.

## 2017-06-18 NOTE — Telephone Encounter (Signed)
Sent in actos, 1 pill daily. Needs visit in 3 months for follow up.

## 2017-06-20 ENCOUNTER — Telehealth: Payer: Self-pay | Admitting: Internal Medicine

## 2017-06-20 NOTE — Telephone Encounter (Signed)
Pt called to get clarification on taking Actos. She wanted to know if she should continue to take the Metformin. I told patient that she should take both of them. Pt voiced understanding.

## 2017-07-30 ENCOUNTER — Telehealth: Payer: Self-pay | Admitting: Internal Medicine

## 2017-07-30 MED ORDER — ACCU-CHEK MULTICLIX LANCETS MISC: 0 refills | Status: DC

## 2017-07-30 MED ORDER — METFORMIN HCL 500 MG PO TABS: 500.0000 mg | ORAL_TABLET | Freq: Two times a day (BID) | ORAL | 0 refills | Status: DC

## 2017-07-30 MED ORDER — GLUCOSE BLOOD VI STRP: 1.0000 | ORAL_STRIP | Freq: Two times a day (BID) | 0 refills | Status: DC

## 2017-07-30 NOTE — Telephone Encounter (Signed)
Copied from [place] [id number]. Topic: Quick Communication - Rx Refill/Question >> Jul 30, 2017 11:46 AM Arletha Grippe wrote: Medication: Lancets (ACCU-CHEK MULTICLIX) lancets and metFORMIN (GLUCOPHAGE) 500 MG tablet   Has the patient contacted their pharmacy? Yes.  Said she called them multiple times    (Agent: If no, request that the patient contact the pharmacy for the refill.)   Preferred Pharmacy (with phone number or street name): [facility] [address] . Pt almost out metformin and only has 1 or 2 lancets left     Agent: Please be advised that RX refills may take up to 3 business days. We ask that you follow-up with your pharmacy.

## 2017-07-30 NOTE — Telephone Encounter (Signed)
Pt's prescription for her Lancets (ACCU-CHEK MULTICLIX) lancets has expired on 02/21/17. Need new prescription.  NOv is 10/09/17 LOV was 11/28.  A1C was 06/12/17.   Metformin refilled.

## 2017-07-30 NOTE — Telephone Encounter (Signed)
Reviewed chart pt is up-to-date sent refills to [facility].../lmb  

## 2017-08-01 DIAGNOSIS — H40013 Open angle with borderline findings, low risk, bilateral: Secondary | ICD-10-CM | POA: Diagnosis not present

## 2017-08-01 DIAGNOSIS — M3501 Sicca syndrome with keratoconjunctivitis: Secondary | ICD-10-CM | POA: Diagnosis not present

## 2017-08-01 DIAGNOSIS — E119 Type 2 diabetes mellitus without complications: Secondary | ICD-10-CM | POA: Diagnosis not present

## 2017-08-01 DIAGNOSIS — H04123 Dry eye syndrome of bilateral lacrimal glands: Secondary | ICD-10-CM | POA: Diagnosis not present

## 2017-08-01 LAB — HM DIABETES EYE EXAM

## 2017-08-02 ENCOUNTER — Encounter: Payer: Self-pay | Admitting: Internal Medicine

## 2017-08-02 NOTE — Progress Notes (Unsigned)
Abstracted and sent to scan  

## 2017-08-12 ENCOUNTER — Other Ambulatory Visit: Payer: Self-pay

## 2017-08-12 ENCOUNTER — Telehealth: Payer: Self-pay | Admitting: Internal Medicine

## 2017-08-12 MED ORDER — ACCU-CHEK AVIVA PLUS W/DEVICE KIT: 1.0000 | PACK | 0 refills | Status: DC

## 2017-08-12 NOTE — Telephone Encounter (Signed)
Copied from [employer]. Topic: Quick Communication - See Telephone Encounter >> Aug 12, 2017  8:25 AM Ether Griffins B wrote: CRM for notification. See Telephone encounter for:  Pt is needing a new accu check monitoring kit called in. Hers broke on Friday and she is unable to check her blood sugar. She's had this monitor since 2017.  08/12/17.

## 2017-08-12 NOTE — Telephone Encounter (Signed)
Sent to pharmacy 

## 2017-09-09 ENCOUNTER — Other Ambulatory Visit: Payer: Self-pay | Admitting: Internal Medicine

## 2017-09-09 NOTE — Telephone Encounter (Signed)
Patient has an appointment on 09/19/17

## 2017-09-09 NOTE — Telephone Encounter (Signed)
Control database checked last refill: 06/05/2017 LOV: CPE 10/25/2016 Next OV: 09/19/2017

## 2017-09-09 NOTE — Telephone Encounter (Signed)
LOV: 10/25/16  PCP: Dr. Pricilla Holm  Pharmacy: [facility] on [address]

## 2017-09-09 NOTE — Telephone Encounter (Signed)
>>   Sep 09, 2017 12:34 PM Oneta Rack wrote: Relation to pt: self Call back [place]: [place], [place] [phone] (Phone) [phone] (Fax)    Reason for call:  Patient states pharmacy was contacted on Thursday 09/05/17 requesting clorazepate (TRANXENE) 7.5 MG tablet refill, patient checking on the status of request, please advise

## 2017-09-10 ENCOUNTER — Encounter: Payer: Self-pay | Admitting: Podiatry

## 2017-09-10 ENCOUNTER — Ambulatory Visit (INDEPENDENT_AMBULATORY_CARE_PROVIDER_SITE_OTHER): Payer: Medicare Other | Admitting: Podiatry

## 2017-09-10 DIAGNOSIS — E1142 Type 2 diabetes mellitus with diabetic polyneuropathy: Secondary | ICD-10-CM | POA: Diagnosis not present

## 2017-09-10 DIAGNOSIS — M79674 Pain in right toe(s): Secondary | ICD-10-CM | POA: Diagnosis not present

## 2017-09-10 DIAGNOSIS — B351 Tinea unguium: Secondary | ICD-10-CM

## 2017-09-10 DIAGNOSIS — L84 Corns and callosities: Secondary | ICD-10-CM

## 2017-09-10 DIAGNOSIS — M79675 Pain in left toe(s): Secondary | ICD-10-CM

## 2017-09-10 MED ORDER — CLORAZEPATE DIPOTASSIUM 7.5 MG PO TABS: ORAL_TABLET | ORAL | 0 refills | Status: DC

## 2017-09-10 NOTE — Progress Notes (Signed)
Subjective: 84 y.o. returns the office today for painful, elongated, thickened toenails which she cannot trim herself. Denies any redness or drainage around the nails.  She also the calluses trimmed her fourth toes.  Denies any acute changes since last appointment and no new complaints today. Denies any systemic complaints such as fevers, chills, nausea, vomiting.   PCP: Hoyt Koch, MD  Objective: AAO 3, NAD DP/PT pulses palpable, CRT less than 3 seconds Sensation mildly decreased with Simms Weinstein monofilament Nails hypertrophic, dystrophic, elongated, brittle, discolored 10. There is tenderness overlying the nails 1-5 bilaterally. There is no surrounding erythema or drainage along the nail sites. No open lesions or pre-ulcerative lesions are identified. Hyperkeratotic lesion dorsal PIPJ of bilateral fourth toes.  No underlying ulceration, drainage or any signs of infection. No other areas of tenderness bilateral lower extremities. No overlying edema, erythema, increased warmth. No pain with calf compression, swelling, warmth, erythema.  Assessment: Patient presents with symptomatic onychomycosis; hyperkeratotic lesions   Plan: -Treatment options including alternatives, risks, complications were discussed -Nails sharply debrided 10 without complication/bleeding. -Hyperkeratotic lesion Sharpy debrided x2 without any complications or bleeding -Discussed daily foot inspection. If there are any changes, to call the office immediately.  -Follow-up in 3 months or sooner if any problems are to arise. In the meantime, encouraged to call the office with any questions, concerns, changes symptoms.  Celesta Gentile, DPM

## 2017-09-19 ENCOUNTER — Encounter: Payer: Self-pay | Admitting: Internal Medicine

## 2017-09-19 ENCOUNTER — Ambulatory Visit (INDEPENDENT_AMBULATORY_CARE_PROVIDER_SITE_OTHER): Payer: Medicare Other | Admitting: Internal Medicine

## 2017-09-19 VITALS — BP 110/60 | HR 84 | Temp 98.1°F | Ht 61.0 in | Wt 125.0 lb

## 2017-09-19 DIAGNOSIS — E118 Type 2 diabetes mellitus with unspecified complications: Secondary | ICD-10-CM | POA: Diagnosis not present

## 2017-09-19 LAB — POCT GLYCOSYLATED HEMOGLOBIN (HGB A1C): Hemoglobin A1C: 8.2

## 2017-09-19 MED ORDER — METFORMIN HCL 500 MG PO TABS: 500.0000 mg | ORAL_TABLET | Freq: Two times a day (BID) | ORAL | 3 refills | Status: DC

## 2017-09-19 MED ORDER — GLUCOSE BLOOD VI STRP: 1.0000 | ORAL_STRIP | Freq: Two times a day (BID) | 6 refills | Status: DC

## 2017-09-19 MED ORDER — SITAGLIPTIN PHOSPHATE 100 MG PO TABS: 100.0000 mg | ORAL_TABLET | Freq: Every day | ORAL | 5 refills | Status: DC

## 2017-09-19 NOTE — Patient Instructions (Addendum)
We will have you stop actos. Keep taking the metformin.   We have sent in januvia to take 1 pill daily

## 2017-09-19 NOTE — Progress Notes (Signed)
   Subjective:    Patient ID: Kimberly Aguirre, female    DOB: [birth year], 84 y.o.   MRN: [id number]  HPI The patient is an 84 YO female coming in for diabetes follow up. She did start taking actos about 3 months ago. She does have some diarrhea which she attributes to the medicine. She denies new numbness or weakness or tinging in her feet. She denies low sugars. She has not made changes to her diet at this time. Still taking metformin as well but this never bothered her in the past.   Review of Systems  Constitutional: Negative.   HENT: Negative.   Eyes: Negative.   Respiratory: Negative for cough, chest tightness and shortness of breath.   Cardiovascular: Negative for chest pain, palpitations and leg swelling.  Gastrointestinal: Positive for diarrhea. Negative for abdominal distention, abdominal pain, blood in stool, constipation, nausea and vomiting.  Musculoskeletal: Negative.   Skin: Negative.   Neurological: Negative.   Psychiatric/Behavioral: Negative.       Objective:   Physical Exam  Constitutional: She is oriented to person, place, and time. She appears well-developed and well-nourished.  HENT:  Head: Normocephalic and atraumatic.  Eyes: EOM are normal.  Neck: Normal range of motion.  Cardiovascular: Normal rate and regular rhythm.  Pulmonary/Chest: Effort normal and breath sounds normal. No respiratory distress. She has no wheezes. She has no rales.  Abdominal: Soft. Bowel sounds are normal. She exhibits no distension. There is no tenderness. There is no rebound.  Musculoskeletal: She exhibits no edema.  Neurological: She is alert and oriented to person, place, and time. Coordination normal.  Skin: Skin is warm and dry.  Psychiatric: She has a normal mood and affect.   Vitals:   09/19/17 1036  BP: 110/60  Pulse: 84  Temp: 98.1 F (36.7 C)  TempSrc: Oral  SpO2: 96%  Weight: 125 lb (56.7 kg)  Height: 5\' 1"  (1.549 m)      Assessment & Plan:

## 2017-09-20 ENCOUNTER — Telehealth: Payer: Self-pay

## 2017-09-20 NOTE — Telephone Encounter (Signed)
PA started on CoverMyMeds KEY: [facility]

## 2017-09-21 ENCOUNTER — Encounter: Payer: Self-pay | Admitting: Internal Medicine

## 2017-09-21 NOTE — Assessment & Plan Note (Signed)
Taking metformin and will stop actos. HgA1c in the office 8.2 which is improved from prior around 9. She is still slightly above goal. Add januvia to her metformin and return in 3 months for visit.

## 2017-09-23 NOTE — Telephone Encounter (Signed)
Pa for Januvia denied. Reasoning is patient must have tried and failed Tradjenta, Nesina, or Onglyza

## 2017-09-24 MED ORDER — LINAGLIPTIN 5 MG PO TABS: 5.0000 mg | ORAL_TABLET | Freq: Every day | ORAL | 6 refills | Status: DC

## 2017-09-24 NOTE — Telephone Encounter (Signed)
Patient informed trdjenta was sent in in replace of the [place]. Patient is going to call back after she talks to her insurance about her test strips states they are now really expensive

## 2017-09-24 NOTE — Telephone Encounter (Signed)
Sent in tradjenta

## 2017-09-24 NOTE — Addendum Note (Signed)
Addended by: Pricilla Holm A on: 09/24/2017 01:55 PM   Modules accepted: Orders

## 2017-09-26 MED ORDER — LANCETS MISC: 0 refills | Status: DC

## 2017-09-26 MED ORDER — ACCU-CHEK AVIVA PLUS W/DEVICE KIT: 1.0000 | PACK | 0 refills | Status: DC

## 2017-09-26 MED ORDER — GLUCOSE BLOOD VI STRP: ORAL_STRIP | 0 refills | Status: DC

## 2017-09-26 MED ORDER — BLOOD GLUCOSE METER KIT: PACK | 0 refills | Status: AC

## 2017-09-26 NOTE — Telephone Encounter (Signed)
Sent in for patient

## 2017-09-26 NOTE — Telephone Encounter (Signed)
Pt called in and provided little information after calling the insurance. She states she was advised there are other options but she doesn't know what they are other than knowing they cover metformin. Pt states that they no longer cover the Accucheck Aviva Plus. She did not get the test strips because they are too expensive. She has 5-6 strips left. She checks 1-2/day. She is requesting provider contact the insurance to find out what is covered that she can take and to find out the supplies they cover.   Call back [phone]  [id number] - [place], [place] [address] [phone] (Phone) [phone] (Fax

## 2017-09-26 NOTE — Addendum Note (Signed)
Addended by: Raford Pitcher R on: 09/26/2017 05:10 PM   Modules accepted: Orders

## 2017-09-30 ENCOUNTER — Telehealth: Payer: Self-pay

## 2017-09-30 NOTE — Telephone Encounter (Signed)
Called patient back and patient received a letter about the tradgenta not being covered, when trying to read me what it said patient decided she wanted to drop off the letter and get a copy to keep for herself. Patient states she will drop it on tomorrow.

## 2017-09-30 NOTE — Telephone Encounter (Signed)
Copied from [place]. Topic: General - Other >> Sep 30, 2017 12:50 PM Marin Olp L wrote: Reason for CRM: Patient would like a call back to review A1C results and discuss diabetic medications.

## 2017-10-02 ENCOUNTER — Telehealth: Payer: Self-pay

## 2017-10-02 NOTE — Telephone Encounter (Signed)
Patient dropped off paperwork she received in the mail because she was confused. The paperwork was stating that the [place] is denied and patient must try tradjenta. Dr. Sharlet Salina has already sent in this medication and she should be able to pick it up and everything should be fine because this medication was on the approved list of medications. Tradjenta should be taken in addition to metformin to help with sugars

## 2017-10-03 MED ORDER — SAXAGLIPTIN HCL 5 MG PO TABS
5.0000 mg | ORAL_TABLET | Freq: Every day | ORAL | 5 refills | Status: DC
Start: 2017-10-03 — End: 2017-11-07

## 2017-10-03 NOTE — Telephone Encounter (Signed)
Advised patient of dr burns note/instructions, patient will call back if not affordable/covered by insurance

## 2017-10-03 NOTE — Telephone Encounter (Addendum)
Pt states the new med sent in she cannot afford.  It is $128. Pt states she has a med prescribed by Baldo Ash when Dr Sharlet Salina was out. It was glipiZIDE (GLUCOTROL XL) 5 MG 24 hr tablet   Pt wants to know if ok to take this med instead, would this still be good? Pt hopes you will call her as soon as you can.

## 2017-10-03 NOTE — Telephone Encounter (Signed)
Routing to dr burns, please advise in the absence of dr crawford, thanks 

## 2017-10-03 NOTE — Telephone Encounter (Signed)
I sent ongylza to her pof -ongylza would be better if covered since it does not let her sugars go to low.  If this is not covered we can do a medication such as glipizide but ideally not an extended release form (risk of causing low sugars)

## 2017-10-08 ENCOUNTER — Telehealth: Payer: Self-pay | Admitting: Internal Medicine

## 2017-10-08 NOTE — Telephone Encounter (Signed)
Called Leah back no answer LMOM RTC..  On 10/02/17 Dr. Sharlet Salina had rx Januvia due to insurance not covering they stated she must try Tragenta. On 10/03/17 pt called back and stated the Tragenta was too expensive, and would like to take glipizide instead. Dr. Sharlet Salina was oput of the office so Dr. Quay Burow address the [facility] stating below...  " according to opt chart Dr. Quay Burow sent in ongylza to her pof -ongylza would be better if covered since it does not let her sugars go to low.  If this is not covered we can do a medication such as glipizide but ideally not an extended release form (risk of causing low sugars)"...Kimberly Aguirre

## 2017-10-08 NOTE — Telephone Encounter (Signed)
Copied from [place] [phone]. Topic: Quick Communication - See Telephone Encounter >> Oct 08, 2017 11:46 AM Ahmed Prima L wrote: CRM for notification. See Telephone encounter for: 10/08/17.  Patient does not recognize saxagliptin HCl (ONGLYZA) 5 MG TABS tablet and why it was prescribed to her and why Dr Quay Burow wrote it. Her insurance plan [employer] called and inquired about this. Call back to [phone] Denny Peon)

## 2017-10-09 NOTE — Telephone Encounter (Signed)
Called pt to verify msg concerning her medications. Pt states another MD sent in [place] for her to take since the insurance would not cover the Januvia that Dr. Sharlet Salina rx, but she states she did not pick rx up because her Dr. Did not tell her to take it, and the medicaine cost $138, and she can not afford to pay that every month. Pt states she is only taking the metformin right now and her BS has been running between 130's-140"s. Actually this am BS was 136. Needig to know what cheaper alternative she can take for her diabetes.Marland KitchenJohny Chess

## 2017-10-10 MED ORDER — ONETOUCH ULTRA 2 W/DEVICE KIT: PACK | 0 refills | Status: AC

## 2017-10-10 MED ORDER — ONETOUCH ULTRASOFT LANCETS MISC: 0 refills | Status: DC

## 2017-10-10 MED ORDER — GLUCOSE BLOOD VI STRP: ORAL_STRIP | 5 refills | Status: DC

## 2017-10-10 NOTE — Telephone Encounter (Signed)
Called pt w/MD response. Pt will start the [place] since [place] inform her about a coupon that she can use at no cost. I went the web site...[email or web address]-williams.[email or web address]/, and printed coupon out. Pt is coming to pick coupon up and take to the pharmacy. Also inform pt that the accu-chek products per Denny Peon is not a preferred BS monitor. Plan covers Contour or One Touch Monitors/wsupplies. Inform pt I will [facility].Marland KitchenJohny Chess

## 2017-10-10 NOTE — Telephone Encounter (Signed)
Patient needs a call back as soon as possible because the coupon was not accepted. She tried to use it at [facility] on [address] for [employer].

## 2017-10-10 NOTE — Telephone Encounter (Signed)
She can take either Tong[place] or onglyza or tradjenta. She should not take multiple of these medications.

## 2017-10-10 NOTE — Telephone Encounter (Signed)
Rec'd call back from [place] from [facility]. Updated her on the medication issue. Per [place] are covered but they are in tier 2 which the pt has a higher copay. She states she also spoke w/the patient and inform her she can go online and get the manufacturer coupon which will save her. Inform Leah we sent a mag to her PCP to see what she want her to take. Pt did not pick the onglyza up because she states Dr. Sharlet Salina did not rx it. Inform her once I talk w/MD I will give pt a call a bck w/her response.Marland KitchenJohny Chess

## 2017-10-10 NOTE — Telephone Encounter (Signed)
The only alternative are the Trajenta which pt states that medication cost more than the onglyza $300 something dollars, and she can;t afford. Per insurance plan Onglyza and Arty Baumgartner are in tier 2... The januvia tier 3. Pharmacy is recommending glipiZIDE (GLUCOTROL XL) 5 MG 24 hr tablets if that ok for pt to take...Kimberly Aguirre

## 2017-10-10 NOTE — Telephone Encounter (Signed)
I would not recommend this medicine. She can also go see a nutritionist and work on diet and exercise changes instead if she is serious about that.

## 2017-10-10 NOTE — Telephone Encounter (Signed)
Called pt back states she states that the pharmacy would not take the coupon. [facility] to see what was the problem w/the coupon spoke w/Matt (pharmacist) he states that they denied the coupon because pt also had medicare. Will have to call [facility] to see what will be an alternative for the onglyza.Marland KitchenJohny Chess

## 2017-10-14 MED ORDER — ACCU-CHEK SOFT TOUCH LANCETS MISC: 0 refills | Status: DC

## 2017-10-14 NOTE — Addendum Note (Signed)
Addended by: Earnstine Regal on: 10/14/2017 10:07 AM   Modules accepted: Orders

## 2017-10-14 NOTE — Telephone Encounter (Signed)
Notified pt w/MD response. Pt states she goes to exercise classes already, and she watches what she eat. She is going to hold off on the referral to see the nutritionist. She states she is coming in in June will discuss w/MD then. Pt is also wanting rx for accu chek lancet sent to pof. She states she just bought the strips and want to use before changing over to the one touch. In form pt will send.Marland KitchenJohny Chess

## 2017-10-16 ENCOUNTER — Other Ambulatory Visit: Payer: Self-pay | Admitting: Internal Medicine

## 2017-10-16 ENCOUNTER — Telehealth: Payer: Self-pay | Admitting: Internal Medicine

## 2017-10-16 NOTE — Telephone Encounter (Signed)
Patient called and left VM to return call to the office with clarification on lancets. I called [place] and the patient was in the pharmacy. I was told that the lancets that were sent in already will be what the patient needs.

## 2017-10-16 NOTE — Telephone Encounter (Signed)
Copied from [place] [phone]. Topic: Quick Communication - See Telephone Encounter >> Oct 16, 2017  9:29 AM Ether Griffins B wrote: CRM for notification. See Telephone encounter for: 10/16/17. Pt states the copuon she was given for the saxagliptin HCl (ONGLYZA) 5 MG TABS tablet  didn't work at the pharmacy and it will cost over 100.00 for her to get and she will not be able to get this filled.

## 2017-10-16 NOTE — Telephone Encounter (Signed)
Patient called, left VM to return the call to the office to clarify the correct lancets. Lancets listed on profile were sent to the pharmacy on 10/10/17 and 10/14/17.

## 2017-10-16 NOTE — Telephone Encounter (Signed)
Copied from [place] [phone]. Topic: Quick Communication - Rx Refill/Question >> Oct 16, 2017  1:39 PM Aurelio Brash B wrote: Medication: Accu check soft clix lancets   Has the patient contacted their pharmacy? yes received wrong lancets  (Agent: If no, request that the patient contact the pharmacy for the refill.)  Preferred Pharmacy (with phone number or street name): [id number] - [place], [place] - [address] [phone] (Phone) [phone] (Fax)      Agent: Please be advised that RX refills may take up to 3 business days. We ask that you follow-up with your pharmacy.

## 2017-10-17 NOTE — Telephone Encounter (Deleted)
See phone note from 10/08/17.[name] Have spoken to patient regarding the onglyza. MD is aware that she will not be taking due to cost. Pt wanted to wait until she see provider to discuss other options. Pt is currently to be taking the metformin 500 twice a day. Sending msg to [facility] center for there [place].Marland KitchenJohny Chess

## 2017-10-17 NOTE — Telephone Encounter (Signed)
See phone note from 10/08/17.[name] Have spoken to patient regarding the onglyza. MD is aware that she will not be taking due to cost. Pt wanted to wait until she see provider to discuss other options. Pt is currently to be taking the metformin 500 twice a day. Sending msg to [facility] center for there [place].Marland KitchenJohny Chess

## 2017-11-06 ENCOUNTER — Ambulatory Visit: Payer: Medicare Other | Admitting: Internal Medicine

## 2017-11-06 DIAGNOSIS — Z2089 Contact with and (suspected) exposure to other communicable diseases: Secondary | ICD-10-CM

## 2017-11-07 ENCOUNTER — Ambulatory Visit (INDEPENDENT_AMBULATORY_CARE_PROVIDER_SITE_OTHER): Payer: Medicare Other | Admitting: Family Medicine

## 2017-11-07 ENCOUNTER — Ambulatory Visit (INDEPENDENT_AMBULATORY_CARE_PROVIDER_SITE_OTHER)
Admission: RE | Admit: 2017-11-07 | Discharge: 2017-11-07 | Disposition: A | Discharge status: Home / Self Care | Payer: Medicare Other | Source: Ambulatory Visit | Attending: Family Medicine | Admitting: Family Medicine

## 2017-11-07 ENCOUNTER — Encounter: Payer: Self-pay | Admitting: Family Medicine

## 2017-11-07 VITALS — BP 106/62 | HR 84 | Temp 98.5°F | Ht 61.0 in | Wt 125.0 lb

## 2017-11-07 DIAGNOSIS — M25512 Pain in left shoulder: Secondary | ICD-10-CM | POA: Insufficient documentation

## 2017-11-07 DIAGNOSIS — S4992XA Unspecified injury of left shoulder and upper arm, initial encounter: Secondary | ICD-10-CM | POA: Diagnosis not present

## 2017-11-07 NOTE — Assessment & Plan Note (Signed)
Pain more overlying the proximal border of the scapula. Rotator cuff is strong  - shoulder and scapula xray  - counseled on heat and ice  - counseled on supportive care - if no improvement consider PT vs trigger point injection
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
## 2017-11-07 NOTE — Patient Instructions (Signed)
Please try to use heat and ice over this area. Please try to massage apply Aspercreme with lidocaine. Tylenol for the pain. We will call you with results from today.

## 2017-11-08 ENCOUNTER — Telehealth: Payer: Self-pay | Admitting: Family Medicine

## 2017-11-08 ENCOUNTER — Telehealth: Payer: Self-pay | Admitting: Internal Medicine

## 2017-11-08 NOTE — Telephone Encounter (Signed)
Left VM for patient. If she calls back please have her speak with a nurse/CMA and inform that her xrays were normal. She should follow up in 3-4 weeks if the area we looked at yesterday doesn't improve. The [facility] can report results to patient.   If any questions then please take the best time and phone number to call and I will try to call her back.   Rosemarie Ax, MD [facility] 11/08/2017, 9:55 AM

## 2017-11-08 NOTE — Telephone Encounter (Signed)
Pt given results per notes of Dr Raeford Razor  on 11/08/17 Unable to document in result note due to result note not being routed to [facility].   Left VM for patient. If she calls back please have her speak with a nurse/CMA and inform that her xrays were normal. She should follow up in 3-4 weeks if the area we looked at yesterday doesn't improve. The [facility] can report results to patient.

## 2017-11-12 ENCOUNTER — Telehealth: Payer: Self-pay | Admitting: Internal Medicine

## 2017-11-12 MED ORDER — ACCU-CHEK SOFT TOUCH LANCETS MISC: 1 refills | Status: DC

## 2017-11-12 NOTE — Telephone Encounter (Signed)
Sent in the soft touch lancts

## 2017-11-12 NOTE — Telephone Encounter (Signed)
Copied from [place] [id number]. Topic: Quick Communication - See Telephone Encounter >> Nov 12, 2017 10:52 AM Synthia Innocent wrote: CRM for notification. See Telephone encounter for: 11/12/17. Patient would like to know if they have stopped making Lancets (ACCU-CHEK SOFT TOUCH) lancets? States one touch was called in, she does not like those. Please advise

## 2017-11-13 ENCOUNTER — Telehealth: Payer: Self-pay | Admitting: Internal Medicine

## 2017-11-13 NOTE — Telephone Encounter (Signed)
Result note read to patient; verbalizes understanding. Result note was not routed to [facility]. 

## 2017-11-14 ENCOUNTER — Telehealth: Payer: Self-pay | Admitting: Internal Medicine

## 2017-11-14 MED ORDER — GLUCOSE BLOOD VI STRP: ORAL_STRIP | 2 refills | Status: DC

## 2017-11-14 NOTE — Telephone Encounter (Signed)
Sent to pharmacy 

## 2017-11-14 NOTE — Telephone Encounter (Signed)
Copied from [place] [phone]. Topic: Quick Communication - Rx Refill/Question >> Nov 14, 2017 10:32 AM Lennox Solders wrote: Medication: one touch test strips . Pt has one touch ultra 2 meter Has the patient contacted their pharmacy? no (Agent: If no, request that the patient contact the pharmacy for the refill.) Preferred Pharmacy (with phone number [facility] street [facility] on [address]. Pt is testing twice a day

## 2017-11-15 ENCOUNTER — Other Ambulatory Visit: Payer: Self-pay | Admitting: Internal Medicine

## 2017-12-05 ENCOUNTER — Other Ambulatory Visit: Payer: Self-pay | Admitting: Internal Medicine

## 2017-12-05 NOTE — Telephone Encounter (Signed)
Control database checked last refill: 09/10/2017  LOV: 09/19/2017

## 2017-12-10 ENCOUNTER — Encounter: Payer: Self-pay | Admitting: Podiatry

## 2017-12-10 ENCOUNTER — Ambulatory Visit (INDEPENDENT_AMBULATORY_CARE_PROVIDER_SITE_OTHER): Payer: Medicare Other | Admitting: Podiatry

## 2017-12-10 DIAGNOSIS — M79675 Pain in left toe(s): Secondary | ICD-10-CM

## 2017-12-10 DIAGNOSIS — M79674 Pain in right toe(s): Secondary | ICD-10-CM

## 2017-12-10 DIAGNOSIS — B351 Tinea unguium: Secondary | ICD-10-CM

## 2017-12-10 DIAGNOSIS — E1142 Type 2 diabetes mellitus with diabetic polyneuropathy: Secondary | ICD-10-CM

## 2017-12-10 NOTE — Progress Notes (Signed)
Subjective: 84 y.o. returns the office today for painful, elongated, thickened toenails which she cannot trim herself. Denies any redness or drainage around the nails.  She states that the left big toenail is getting ingrown on the corner, pointing to the lateral aspect and this causes pain with shoes and pressure.  Denies any acute changes since last appointment and no new complaints today. Denies any systemic complaints such as fevers, chills, nausea, vomiting.   PCP: Hoyt Koch, MD  Objective: AAO 3, NAD DP/PT pulses palpable, CRT less than 3 seconds Sensation mildly decreased with Simms Weinstein monofilament Nails hypertrophic, dystrophic, elongated, brittle, discolored 10. There is tenderness overlying the nails 1-5 bilaterally. There is no surrounding erythema or drainage along the nail sites. There is incurvation present along the lateral aspect the left hallux toenail with tenderness palpation but there is no edema, erythema, drainage or pus or any clinical signs of infection. No open lesions or pre-ulcerative lesions are identified. No other areas of tenderness bilateral lower extremities. No overlying edema, erythema, increased warmth. No pain with calf compression, swelling, warmth, erythema.  Assessment: Patient presents with symptomatic onychomycosis; ingrown toenails  Plan: -Treatment options including alternatives, risks, complications were discussed -Nails sharply debrided 10 without complication/bleeding.  I debrided the ingrown toenail so that any complications or bleeding on the left hallux toenail.  This was only for the very distal aspect of the nail.  Continue to monitor for any recurrence. -Discussed daily foot inspection. If there are any changes, to call the office immediately.  -Follow-up in 3 months or sooner if any problems are to arise.  She had no further questions or concerns today.  In the meantime, encouraged to call the office with any questions,  concerns, changes symptoms.  Celesta Gentile, DPM

## 2017-12-23 ENCOUNTER — Telehealth: Payer: Self-pay | Admitting: Internal Medicine

## 2017-12-23 NOTE — Telephone Encounter (Signed)
Copied from [place] [id number]. Topic: Quick Communication - See Telephone Encounter >> Dec 23, 2017 11:49 AM Antonieta Iba C wrote: CRM for notification. See Telephone encounter for: 12/23/17.   CB: [phone] - pt says that she was doing some yard work and noticed that she has a rash on her neck, pt says that she thought it was poison ivy/poison oak, pt says that she understands that provider may not be able to prescribe a medication but she would like to know if provider has any suggestions on what she could use for it OTC? Pt has an apt with PCP on Thursday currently.  Please advise.

## 2017-12-24 ENCOUNTER — Other Ambulatory Visit: Payer: Self-pay | Admitting: Internal Medicine

## 2017-12-24 DIAGNOSIS — Z1231 Encounter for screening mammogram for malignant neoplasm of breast: Secondary | ICD-10-CM

## 2017-12-24 NOTE — Telephone Encounter (Signed)
Called patient back and patient states that she has been using calamine lotion and the itching has subsided but the rash is still there. States she will just wait till her appointment on Thursday.

## 2017-12-26 ENCOUNTER — Encounter: Payer: Self-pay | Admitting: Internal Medicine

## 2017-12-26 ENCOUNTER — Other Ambulatory Visit (INDEPENDENT_AMBULATORY_CARE_PROVIDER_SITE_OTHER): Payer: Medicare Other

## 2017-12-26 ENCOUNTER — Ambulatory Visit (INDEPENDENT_AMBULATORY_CARE_PROVIDER_SITE_OTHER): Payer: Medicare Other | Admitting: Internal Medicine

## 2017-12-26 VITALS — BP 112/60 | HR 75 | Temp 98.5°F | Ht 61.0 in | Wt 123.0 lb

## 2017-12-26 DIAGNOSIS — E118 Type 2 diabetes mellitus with unspecified complications: Secondary | ICD-10-CM

## 2017-12-26 DIAGNOSIS — R21 Rash and other nonspecific skin eruption: Secondary | ICD-10-CM | POA: Diagnosis not present

## 2017-12-26 LAB — COMPREHENSIVE METABOLIC PANEL
ALK PHOS: 90 U/L (ref 39–117)
ALT: 17 U/L (ref 0–35)
AST: 18 U/L (ref 0–37)
Albumin: 4.2 g/dL (ref 3.5–5.2)
BUN: 13 mg/dL (ref 6–23)
CHLORIDE: 100 meq/L (ref 96–112)
CO2: 33 mEq/L — ABNORMAL HIGH (ref 19–32)
CREATININE: 0.76 mg/dL (ref 0.40–1.20)
Calcium: 9.9 mg/dL (ref 8.4–10.5)
GFR: 93.65 mL/min (ref >60.00)
GLUCOSE: 171 mg/dL — AB (ref 70–99)
POTASSIUM: 4.1 meq/L (ref 3.5–5.1)
SODIUM: 139 meq/L (ref 135–145)
TOTAL PROTEIN: 7.7 g/dL (ref 6.0–8.3)
Total Bilirubin: 0.3 mg/dL (ref 0.2–1.2)

## 2017-12-26 LAB — MICROALBUMIN / CREATININE URINE RATIO
Creatinine,U: 61.4 mg/dL
MICROALB/CREAT RATIO: 2 mg/g (ref 0.0–30.0)
Microalb, Ur: 1.2 mg/dL (ref 0.0–1.9)

## 2017-12-26 LAB — LIPID PANEL
Cholesterol: 183 mg/dL (ref 0–200)
HDL: 51.4 mg/dL (ref >39.00)
LDL CALC: 102 mg/dL — AB (ref 0–99)
NONHDL: 131.39
Total CHOL/HDL Ratio: 4
Triglycerides: 148 mg/dL (ref 0.0–149.0)
VLDL: 29.6 mg/dL (ref 0.0–40.0)

## 2017-12-26 LAB — HEMOGLOBIN A1C: Hgb A1c MFr Bld: 8.1 % — ABNORMAL HIGH (ref 4.6–6.5)

## 2017-12-26 MED ORDER — TRIAMCINOLONE ACETONIDE 0.1 % EX CREA: 1.0000 "application " | TOPICAL_CREAM | Freq: Two times a day (BID) | CUTANEOUS | 0 refills | Status: DC

## 2017-12-26 NOTE — Progress Notes (Addendum)
   Subjective:    Patient ID: Kimberly Aguirre, female    DOB: August 04, 1933, 84 y.o.   MRN: [id number]  HPI The patient is an 84 YO female coming in for follow up of diabetes. She did not start taking [place] due to cost and still taking metformin. She stopped having diarrhea now that she is off actos. She has worked on her diet some and lost 2 pounds since last visit. She denies new numbness or weakness. Denies fevers or chills.  Also new problem of rash on the left back neck. Itching some. She is trying not to scratch. She cannot see it and wants to make sure it is not a tick or bug bite. Overall worsening.   Review of Systems  Constitutional: Negative.   HENT: Negative.   Eyes: Negative.   Respiratory: Negative for cough, chest tightness and shortness of breath.   Cardiovascular: Negative for chest pain, palpitations and leg swelling.  Gastrointestinal: Negative for abdominal distention, abdominal pain, constipation, diarrhea, nausea and vomiting.  Musculoskeletal: Negative.   Skin: Positive for rash.  Neurological: Negative.   Psychiatric/Behavioral: Negative.       Objective:   Physical Exam  Constitutional: She is oriented to person, place, and time. She appears well-developed and well-nourished.  HENT:  Head: Normocephalic and atraumatic.  Eyes: EOM are normal.  Neck: Normal range of motion.  Cardiovascular: Normal rate and regular rhythm.  Pulmonary/Chest: Effort normal and breath sounds normal. No respiratory distress. She has no wheezes. She has no rales.  Abdominal: Soft. Bowel sounds are normal. She exhibits no distension. There is no tenderness. There is no rebound.  Musculoskeletal: She exhibits no edema.  Neurological: She is alert and oriented to person, place, and time. Coordination normal.  Skin: Skin is warm and dry. Rash noted.  Dermatitis on the left posterior neck, no skin breakdown or tick present on exam, no target rash  Psychiatric: She has a normal mood and  affect.   Vitals:   12/26/17 1023  BP: 112/60  Pulse: 75  Temp: 98.5 F (36.9 C)  TempSrc: Oral  SpO2: 98%  Weight: 123 lb (55.8 kg)  Height: 5\' 1"  (1.549 m)      Assessment & Plan:

## 2017-12-26 NOTE — Patient Instructions (Signed)
We have sent in the cream to use twice a day on the neck.   We are checking the labs and will adjust the metformin if needed.

## 2017-12-28 NOTE — Assessment & Plan Note (Signed)
Currently taking metformin not at max dosing. Did not take [place] due to cost. Checking HgA1c today and adjust metformin dosing if not at goal of <8. Checking microalbumin to creatinine ratio. She is not on ACE-I or ARB. Checking lipid panel as not on statin currently.

## 2018-01-02 NOTE — Assessment & Plan Note (Signed)
Rx for triamcinolone cream for the rash which is likely a contact dermatitis from outdoor elements. Advised not to scratch and lotion once healed.

## 2018-01-29 ENCOUNTER — Ambulatory Visit
Admission: RE | Admit: 2018-01-29 | Discharge: 2018-01-29 | Disposition: A | Discharge status: Home / Self Care | Payer: Medicare Other | Source: Ambulatory Visit | Attending: Internal Medicine | Admitting: Internal Medicine

## 2018-01-29 DIAGNOSIS — Z1231 Encounter for screening mammogram for malignant neoplasm of breast: Secondary | ICD-10-CM

## 2018-02-03 ENCOUNTER — Ambulatory Visit: Payer: Medicare Other | Admitting: Internal Medicine

## 2018-02-03 DIAGNOSIS — Z0289 Encounter for other administrative examinations: Secondary | ICD-10-CM

## 2018-02-13 ENCOUNTER — Other Ambulatory Visit: Payer: Self-pay

## 2018-03-03 ENCOUNTER — Encounter: Payer: Self-pay | Admitting: Internal Medicine

## 2018-03-03 ENCOUNTER — Ambulatory Visit (INDEPENDENT_AMBULATORY_CARE_PROVIDER_SITE_OTHER): Payer: Medicare Other | Admitting: Internal Medicine

## 2018-03-03 VITALS — BP 122/74 | HR 86 | Temp 98.3°F | Ht 61.0 in | Wt 121.0 lb

## 2018-03-03 DIAGNOSIS — G44209 Tension-type headache, unspecified, not intractable: Secondary | ICD-10-CM

## 2018-03-03 DIAGNOSIS — E118 Type 2 diabetes mellitus with unspecified complications: Secondary | ICD-10-CM | POA: Diagnosis not present

## 2018-03-03 NOTE — Addendum Note (Signed)
Addended by: Pricilla Holm A on: 03/03/2018 11:32 AM   Modules accepted: Orders

## 2018-03-03 NOTE — Progress Notes (Signed)
   Subjective:    Patient ID: Kimberly Aguirre, female    DOB: November 11, 1933, 84 y.o.   MRN: [id number]  HPI The patient is an 84 YO female coming in for headache for the last 4 days or so. Denies injury or trauma. Taking tylenol for the pain with some relief. Denies numbness, tingling, weakness. Denies light or sound sensitivity. No neck pain or stiffness. Denies change in medicines. Denies missing medications. Overall it is coming and going over the last 4 days. Denies any low sugars. BP is normal. She denies change in caffeine, drinks coffee in the morning and several diet mountain dew drinks in a day which has not changed. Denies change in vision acutely although she does have chronic vision problems.   Review of Systems  Constitutional: Negative.   HENT: Negative.   Eyes: Negative.   Respiratory: Negative for cough, chest tightness and shortness of breath.   Cardiovascular: Negative for chest pain, palpitations and leg swelling.  Gastrointestinal: Negative for abdominal distention, abdominal pain, constipation, diarrhea, nausea and vomiting.  Musculoskeletal: Negative.   Skin: Negative.   Neurological: Positive for headaches. Negative for dizziness, tremors, seizures, facial asymmetry, weakness and numbness.  Psychiatric/Behavioral: Negative.       Objective:   Physical Exam  Constitutional: She is oriented to person, place, and time. She appears well-developed and well-nourished.  HENT:  Head: Normocephalic and atraumatic.  Eyes: EOM are normal.  Neck: Normal range of motion.  Cardiovascular: Normal rate and regular rhythm.  Pulmonary/Chest: Effort normal and breath sounds normal. No respiratory distress. She has no wheezes. She has no rales.  Abdominal: Soft. Bowel sounds are normal. She exhibits no distension. There is no tenderness. There is no rebound.  Musculoskeletal: She exhibits no edema.  Neurological: She is alert and oriented to person, place, and time. Coordination normal.    Skin: Skin is warm and dry.  Psychiatric: She has a normal mood and affect.   Vitals:   03/03/18 0951  BP: 122/74  Pulse: 86  Temp: 98.3 F (36.8 C)  TempSrc: Oral  SpO2: 96%  Weight: 121 lb (54.9 kg)  Height: 5\' 1"  (1.549 m)      Assessment & Plan:

## 2018-03-03 NOTE — Patient Instructions (Signed)
It is okay to use the tylenol for the headaches.  It is okay to try metamucil to see if this helps the bowels.   Try keeping a food diary to see if we can track which foods cause the leaking bowels.

## 2018-03-03 NOTE — Assessment & Plan Note (Addendum)
Suspect tension or similar. No temporal symptoms or tenderness on exam. No symptoms of stroke. Advised to continue using tylenol and let us know if these continue. No labs or imaging indicated today. Reassurance given and advised to cut back on caffeine.

## 2018-03-11 ENCOUNTER — Ambulatory Visit (INDEPENDENT_AMBULATORY_CARE_PROVIDER_SITE_OTHER): Payer: Medicare Other | Admitting: Podiatry

## 2018-03-11 ENCOUNTER — Encounter: Payer: Self-pay | Admitting: Podiatry

## 2018-03-11 DIAGNOSIS — B351 Tinea unguium: Secondary | ICD-10-CM | POA: Diagnosis not present

## 2018-03-11 DIAGNOSIS — E1142 Type 2 diabetes mellitus with diabetic polyneuropathy: Secondary | ICD-10-CM

## 2018-03-11 DIAGNOSIS — M79674 Pain in right toe(s): Secondary | ICD-10-CM | POA: Diagnosis not present

## 2018-03-11 DIAGNOSIS — M79675 Pain in left toe(s): Secondary | ICD-10-CM | POA: Diagnosis not present

## 2018-03-11 NOTE — Progress Notes (Signed)
Subjective: 84 y.o. returns the office today for painful, elongated, thickened toenails which she cannot trim herself. Denies any redness or drainage around the nails.  Denies any acute changes since last appointment and no new complaints today. Denies any systemic complaints such as fevers, chills, nausea, vomiting.   PCP: Hoyt Koch, MD  Objective: AAO 3, NAD DP/PT pulses palpable, CRT less than 3 seconds Sensation mildly decreased with Simms Weinstein monofilament Nails hypertrophic, dystrophic, elongated, brittle, discolored 10. There is tenderness overlying the nails 1-5 bilaterally. There is no surrounding erythema or drainage along the nail sites. No open lesions or pre-ulcerative lesions are identified. No other areas of tenderness bilateral lower extremities. No overlying edema, erythema, increased warmth. No pain with calf compression, swelling, warmth, erythema.  Assessment: Patient presents with symptomatic onychomycosis; ingrown toenails  Plan: -Treatment options including alternatives, risks, complications were discussed -Nails sharply debrided 10 without complication/bleeding. -Discussed daily foot inspection. If there are any changes, to call the office immediately.  -Follow-up in 3 months or sooner if any problems are to arise.  She had no further questions or concerns today.  In the meantime, encouraged to call the office with any questions, concerns, changes symptoms.  Celesta Gentile, DPM

## 2018-03-20 ENCOUNTER — Telehealth: Payer: Self-pay

## 2018-03-20 NOTE — Telephone Encounter (Signed)
Copied from [place] [id number]. Topic: General - Other >> Mar 20, 2018 11:18 AM Keene Breath wrote: Reason for CRM: Patient called because she is concerned about her medication clorazepate (TRANXENE) 7.5 MG tablet.  Patient stated that the refilled that she picked up from the pharmacy on 8/26, has a different number on the pill bottle than the previous bottle she had.  She is afraid to take this medication because she thinks it might not be correct.  Please advise and call patient to verify.  CB# [phone]

## 2018-03-20 NOTE — Telephone Encounter (Signed)
Called pt to clarify the number on the prescription bottle label. Pt read off the label of her medication exactly like it is on the med list in the chart. The number that she is referring to is the prescription #. It is different from her last bottle. But the label read clorazepate 7.5 mg tablets. Told patient that if she feels like something is wrong with her medication, to contact the pharmacy. She had already done this and was told that it was the same medicine but just a different prescription. Pt voiced understanding and was advised to call back for any other questions or concerns. Pt in agreement.

## 2018-03-20 NOTE — Telephone Encounter (Signed)
noted 

## 2018-03-20 NOTE — Telephone Encounter (Signed)
LVM for patient to call back and let me know what number she was talking about that is different on her medication bottle

## 2018-04-01 ENCOUNTER — Encounter: Payer: Self-pay | Admitting: Internal Medicine

## 2018-04-01 ENCOUNTER — Other Ambulatory Visit (INDEPENDENT_AMBULATORY_CARE_PROVIDER_SITE_OTHER): Payer: Medicare Other

## 2018-04-01 ENCOUNTER — Ambulatory Visit (INDEPENDENT_AMBULATORY_CARE_PROVIDER_SITE_OTHER): Payer: Medicare Other | Admitting: Internal Medicine

## 2018-04-01 VITALS — BP 100/60 | HR 79 | Temp 98.3°F | Ht 61.0 in | Wt 119.0 lb

## 2018-04-01 DIAGNOSIS — E118 Type 2 diabetes mellitus with unspecified complications: Secondary | ICD-10-CM

## 2018-04-01 DIAGNOSIS — Z23 Encounter for immunization: Secondary | ICD-10-CM | POA: Diagnosis not present

## 2018-04-01 DIAGNOSIS — Z0000 Encounter for general adult medical examination without abnormal findings: Secondary | ICD-10-CM | POA: Diagnosis not present

## 2018-04-01 DIAGNOSIS — G44209 Tension-type headache, unspecified, not intractable: Secondary | ICD-10-CM

## 2018-04-01 DIAGNOSIS — F411 Generalized anxiety disorder: Secondary | ICD-10-CM

## 2018-04-01 LAB — HEMOGLOBIN A1C: Hgb A1c MFr Bld: 8.4 % — ABNORMAL HIGH (ref 4.6–6.5)

## 2018-04-01 NOTE — Assessment & Plan Note (Signed)
Taking tranxene daily as needed. Working on other stress methods as well. She is aware of risk of dependence, falls, memory change with long term usage and wants to continue therapy for positive increase in QOL.

## 2018-04-01 NOTE — Assessment & Plan Note (Signed)
Overall improving and could be related to stress. BP at goal so not likely related although she will check her BP when having headache to be sure. Recent labs okay so no further imaging or labs are indicated at this time. History and exam not consistent with temporal arteritis.

## 2018-04-01 NOTE — Patient Instructions (Signed)
We have given you the flu shot today and are checking the sugar levels.  Health Maintenance, Female Adopting a healthy lifestyle and getting preventive care can go a long way to promote health and wellness. Talk with your health care provider about what schedule of regular examinations is right for you. This is a good chance for you to check in with your provider about disease prevention and staying healthy. In between checkups, there are plenty of things you can do on your own. Experts have done a lot of research about which lifestyle changes and preventive measures are most likely to keep you healthy. Ask your health care provider for more information. Weight and diet Eat a healthy diet  Be sure to include plenty of vegetables, fruits, low-fat dairy products, and lean protein.  Do not eat a lot of foods high in solid fats, added sugars, or salt.  Get regular exercise. This is one of the most important things you can do for your health. ? Most adults should exercise for at least 150 minutes each week. The exercise should increase your heart rate and make you sweat (moderate-intensity exercise). ? Most adults should also do strengthening exercises at least twice a week. This is in addition to the moderate-intensity exercise.  Maintain a healthy weight  Body mass index (BMI) is a measurement that can be used to identify possible weight problems. It estimates body fat based on height and weight. Your health care provider can help determine your BMI and help you achieve or maintain a healthy weight.  For females 70 years of age and older: ? A BMI below 18.5 is considered underweight. ? A BMI of 18.5 to 24.9 is normal. ? A BMI of 25 to 29.9 is considered overweight. ? A BMI of 30 and above is considered obese.  Watch levels of cholesterol and blood lipids  You should start having your blood tested for lipids and cholesterol at 82 years of age, then have this test every 5 years.  You may  need to have your cholesterol levels checked more often if: ? Your lipid or cholesterol levels are high. ? You are older than 82 years of age. ? You are at high risk for heart disease.  Cancer screening Lung Cancer  Lung cancer screening is recommended for adults 72-16 years old who are at high risk for lung cancer because of a history of smoking.  A yearly low-dose CT scan of the lungs is recommended for people who: ? Currently smoke. ? Have quit within the past 15 years. ? Have at least a 30-pack-year history of smoking. A pack year is smoking an average of one pack of cigarettes a day for 1 year.  Yearly screening should continue until it has been 15 years since you quit.  Yearly screening should stop if you develop a health problem that would prevent you from having lung cancer treatment.  Breast Cancer  Practice breast self-awareness. This means understanding how your breasts normally appear and feel.  It also means doing regular breast self-exams. Let your health care provider know about any changes, no matter how small.  If you are in your 20s or 30s, you should have a clinical breast exam (CBE) by a health care provider every 1-3 years as part of a regular health exam.  If you are 71 or older, have a CBE every year. Also consider having a breast X-ray (mammogram) every year.  If you have a family history of breast cancer, talk  to your health care provider about genetic screening.  If you are at high risk for breast cancer, talk to your health care provider about having an MRI and a mammogram every year.  Breast cancer gene (BRCA) assessment is recommended for women who have family members with BRCA-related cancers. BRCA-related cancers include: ? Breast. ? Ovarian. ? Tubal. ? Peritoneal cancers.  Results of the assessment will determine the need for genetic counseling and BRCA1 and BRCA2 testing.  Cervical Cancer Your health care provider may recommend that you be  screened regularly for cancer of the pelvic organs (ovaries, uterus, and vagina). This screening involves a pelvic examination, including checking for microscopic changes to the surface of your cervix (Pap test). You may be encouraged to have this screening done every 3 years, beginning at age 42.  For women ages 66-65, health care providers may recommend pelvic exams and Pap testing every 3 years, or they may recommend the Pap and pelvic exam, combined with testing for human papilloma virus (HPV), every 5 years. Some types of HPV increase your risk of cervical cancer. Testing for HPV may also be done on women of any age with unclear Pap test results.  Other health care providers may not recommend any screening for nonpregnant women who are considered low risk for pelvic cancer and who do not have symptoms. Ask your health care provider if a screening pelvic exam is right for you.  If you have had past treatment for cervical cancer or a condition that could lead to cancer, you need Pap tests and screening for cancer for at least 20 years after your treatment. If Pap tests have been discontinued, your risk factors (such as having a new sexual partner) need to be reassessed to determine if screening should resume. Some women have medical problems that increase the chance of getting cervical cancer. In these cases, your health care provider may recommend more frequent screening and Pap tests.  Colorectal Cancer  This type of cancer can be detected and often prevented.  Routine colorectal cancer screening usually begins at 82 years of age and continues through 82 years of age.  Your health care provider may recommend screening at an earlier age if you have risk factors for colon cancer.  Your health care provider may also recommend using home test kits to check for hidden blood in the stool.  A small camera at the end of a tube can be used to examine your colon directly (sigmoidoscopy or colonoscopy).  This is done to check for the earliest forms of colorectal cancer.  Routine screening usually begins at age 44.  Direct examination of the colon should be repeated every 5-10 years through 82 years of age. However, you may need to be screened more often if early forms of precancerous polyps or small growths are found.  Skin Cancer  Check your skin from head to toe regularly.  Tell your health care provider about any new moles or changes in moles, especially if there is a change in a mole's shape or color.  Also tell your health care provider if you have a mole that is larger than the size of a pencil eraser.  Always use sunscreen. Apply sunscreen liberally and repeatedly throughout the day.  Protect yourself by wearing long sleeves, pants, a wide-brimmed hat, and sunglasses whenever you are outside.  Heart disease, diabetes, and high blood pressure  High blood pressure causes heart disease and increases the risk of stroke. High blood pressure is more  likely to develop in: ? People who have blood pressure in the high end of the normal range (130-139/85-89 mm Hg). ? People who are overweight or obese. ? People who are African American.  If you are 36-78 years of age, have your blood pressure checked every 3-5 years. If you are 46 years of age or older, have your blood pressure checked every year. You should have your blood pressure measured twice-once when you are at a hospital or clinic, and once when you are not at a hospital or clinic. Record the average of the two measurements. To check your blood pressure when you are not at a hospital or clinic, you can use: ? An automated blood pressure machine at a pharmacy. ? A home blood pressure monitor.  If you are between 20 years and 57 years old, ask your health care provider if you should take aspirin to prevent strokes.  Have regular diabetes screenings. This involves taking a blood sample to check your fasting blood sugar level. ? If  you are at a normal weight and have a low risk for diabetes, have this test once every three years after 82 years of age. ? If you are overweight and have a high risk for diabetes, consider being tested at a younger age or more often. Preventing infection Hepatitis B  If you have a higher risk for hepatitis B, you should be screened for this virus. You are considered at high risk for hepatitis B if: ? You were born in a country where hepatitis B is common. Ask your health care provider which countries are considered high risk. ? Your parents were born in a high-risk country, and you have not been immunized against hepatitis B (hepatitis B vaccine). ? You have HIV or AIDS. ? You use needles to inject street drugs. ? You live with someone who has hepatitis B. ? You have had sex with someone who has hepatitis B. ? You get hemodialysis treatment. ? You take certain medicines for conditions, including cancer, organ transplantation, and autoimmune conditions.  Hepatitis C  Blood testing is recommended for: ? Everyone born from [birth year] through 1963. ? Anyone with known risk factors for hepatitis C.  Sexually transmitted infections (STIs)  You should be screened for sexually transmitted infections (STIs) including gonorrhea and chlamydia if: ? You are sexually active and are younger than 82 years of age. ? You are older than 82 years of age and your health care provider tells you that you are at risk for this type of infection. ? Your sexual activity has changed since you were last screened and you are at an increased risk for chlamydia or gonorrhea. Ask your health care provider if you are at risk.  If you do not have HIV, but are at risk, it may be recommended that you take a prescription medicine daily to prevent HIV infection. This is called pre-exposure prophylaxis (PrEP). You are considered at risk if: ? You are sexually active and do not regularly use condoms or know the HIV status of your  partner(s). ? You take drugs by injection. ? You are sexually active with a partner who has HIV.  Talk with your health care provider about whether you are at high risk of being infected with HIV. If you choose to begin PrEP, you should first be tested for HIV. You should then be tested every 3 months for as long as you are taking PrEP. Pregnancy  If you are premenopausal and you may  become pregnant, ask your health care provider about preconception counseling.  If you may become pregnant, take 400 to 800 micrograms (mcg) of folic acid every day.  If you want to prevent pregnancy, talk to your health care provider about birth control (contraception). Osteoporosis and menopause  Osteoporosis is a disease in which the bones lose minerals and strength with aging. This can result in serious bone fractures. Your risk for osteoporosis can be identified using a bone density scan.  If you are 40 years of age or older, or if you are at risk for osteoporosis and fractures, ask your health care provider if you should be screened.  Ask your health care provider whether you should take a calcium or vitamin D supplement to lower your risk for osteoporosis.  Menopause may have certain physical symptoms and risks.  Hormone replacement therapy may reduce some of these symptoms and risks. Talk to your health care provider about whether hormone replacement therapy is right for you. Follow these instructions at home:  Schedule regular health, dental, and eye exams.  Stay current with your immunizations.  Do not use any tobacco products including cigarettes, chewing tobacco, or electronic cigarettes.  If you are pregnant, do not drink alcohol.  If you are breastfeeding, limit how much and how often you drink alcohol.  Limit alcohol intake to no more than 1 drink per day for nonpregnant women. One drink equals 12 ounces of beer, 5 ounces of wine, or 1 ounces of hard liquor.  Do not use street  drugs.  Do not share needles.  Ask your health care provider for help if you need support or information about quitting drugs.  Tell your health care provider if you often feel depressed.  Tell your health care provider if you have ever been abused or do not feel safe at home. This information is not intended to replace advice given to you by your health care provider. Make sure you discuss any questions you have with your health care provider. Document Released: 01/15/2011 Document Revised: 12/08/2015 Document Reviewed: 04/05/2015 Elsevier Interactive Patient Education  [employer].

## 2018-04-01 NOTE — Progress Notes (Signed)
   Subjective:    Patient ID: Kimberly Aguirre, female    DOB: 06/15/1934, 84 y.o.   MRN: [id number]  HPI Here for medicare wellness, no new complaints. Please see A/P for status and treatment of chronic medical problems.   HPI #2: Follow up of diabetes (taking metformin and sugars are close to goal previous, they go increase after eating, morning sugars are okay, denies diet changes, with the stress recently she is down about 3-4 pounds, does exercise some but not regular, denies change to numbness and tingling in feet, they tingle sometimes, she does not take medicine for this) and anxiety (more lately with the stress in her life, using her tranxene rarely, denies taking too much or running out early, denies side effects such as confusion, dry mouth, constipation, or falls) and headaches (they are improving since last visit, she had a niece die unexpectedly in car accident which she thinks was the cause, she is not taking anything for it, denies sinus congestion, denies fevers or chills, denies vision changes, rare tylenol for pain but not in the last 1-2 weeks).   Diet: DM since diabetic Physical activity: sedentary Depression/mood screen: negative Hearing: intact to whispered voice Visual acuity: grossly normal right eye, left eye blind, performs annual eye exam  ADLs: capable Fall risk: none Home safety: good Cognitive evaluation: intact to orientation, naming, recall and repetition EOL planning: adv directives discussed  I have personally reviewed and have noted 1. The patient's medical and social history - reviewed today no changes 2. Their use of alcohol, tobacco or illicit drugs 3. Their current medications and supplements 4. The patient's functional ability including ADL's, fall risks, home safety risks and hearing or visual impairment. 5. Diet and physical activities 6. Evidence for depression or mood disorders 7. Care team reviewed and updated (available in snapshot)  Review of  Systems  Constitutional: Negative.   HENT: Negative.   Eyes: Negative.   Respiratory: Negative for cough, chest tightness and shortness of breath.   Cardiovascular: Negative for chest pain, palpitations and leg swelling.  Gastrointestinal: Negative for abdominal distention, abdominal pain, constipation, diarrhea, nausea and vomiting.  Musculoskeletal: Negative.   Skin: Negative.   Neurological: Positive for numbness and headaches.  Psychiatric/Behavioral: Positive for sleep disturbance. Negative for behavioral problems, confusion, dysphoric mood and hallucinations. The patient is nervous/anxious.       Objective:   Physical Exam  Constitutional: She is oriented to person, place, and time. She appears well-developed and well-nourished.  HENT:  Head: Normocephalic and atraumatic.  Eyes: EOM are normal.  Neck: Normal range of motion.  Cardiovascular: Normal rate and regular rhythm.  Pulmonary/Chest: Effort normal and breath sounds normal. No respiratory distress. She has no wheezes. She has no rales.  Abdominal: Soft. Bowel sounds are normal. She exhibits no distension. There is no tenderness. There is no rebound.  Musculoskeletal: She exhibits no edema.  Neurological: She is alert and oriented to person, place, and time. A cranial nerve deficit is present. Coordination normal.  Neuropathy stable  Skin: Skin is warm and dry.  Psychiatric: She has a normal mood and affect.   Vitals:   04/01/18 1016  BP: 100/60  Pulse: 79  Temp: 98.3 F (36.8 C)  TempSrc: Oral  SpO2: 98%  Weight: 119 lb (54 kg)  Height: 5\' 1"  (1.549 m)      Assessment & Plan:  Flu shot given at visit

## 2018-04-01 NOTE — Assessment & Plan Note (Signed)
Flu shot given. Pneumonia up to date. Shingrix counseled. Tetanus up to date. Colonoscopy aged out. Mammogram aged out, pap smear aged out and dexa up to date. Counseled about sun safety and mole surveillance. Counseled about the dangers of distracted driving. Given 10 year screening recommendations.  

## 2018-04-01 NOTE — Assessment & Plan Note (Signed)
Complicated by neuropathy which is stable. Taking metformin 500 mg BID. Last HgA1c was close to goal at 8.1. This is acceptable level of control for her. Foot exam up to date. Eye exam up to date. Not on ACE-I or ARB but microalbumin without change. Cholesterol is at goal on fish oil. Adjust as needed.

## 2018-04-11 ENCOUNTER — Ambulatory Visit: Payer: Self-pay | Admitting: *Deleted

## 2018-04-11 NOTE — Telephone Encounter (Signed)
Pt called to ask if she could take Zyrtec for a runny nose. She has some already at home. Per reference, pt advise to go ahead and take the medication. She denies fever or respiratory distress. Will call back for any increase in symptoms.  Advised calling her pharmacist for any medication questions also.  Pt voiced understanding.   Reason for Disposition . Caller has medication question, adult has minor symptoms, caller declines triage, and triager answers question  Answer Assessment - Initial Assessment Questions 1. SYMPTOMS: "Do you have any symptoms?"     Runny nose 2. SEVERITY: If symptoms are present, ask "Are they mild, moderate or severe?"     mild  Protocols used: MEDICATION QUESTION CALL-A-AH

## 2018-05-09 ENCOUNTER — Ambulatory Visit: Payer: Medicare Other | Admitting: Podiatry

## 2018-06-03 ENCOUNTER — Encounter: Payer: Self-pay | Admitting: Podiatry

## 2018-06-03 ENCOUNTER — Ambulatory Visit (INDEPENDENT_AMBULATORY_CARE_PROVIDER_SITE_OTHER): Payer: Medicare Other | Admitting: Podiatry

## 2018-06-03 DIAGNOSIS — M79675 Pain in left toe(s): Secondary | ICD-10-CM | POA: Diagnosis not present

## 2018-06-03 DIAGNOSIS — B351 Tinea unguium: Secondary | ICD-10-CM

## 2018-06-03 DIAGNOSIS — M79674 Pain in right toe(s): Secondary | ICD-10-CM | POA: Diagnosis not present

## 2018-06-03 DIAGNOSIS — L84 Corns and callosities: Secondary | ICD-10-CM | POA: Diagnosis not present

## 2018-06-03 DIAGNOSIS — E1142 Type 2 diabetes mellitus with diabetic polyneuropathy: Secondary | ICD-10-CM | POA: Diagnosis not present

## 2018-06-03 NOTE — Patient Instructions (Addendum)
Diabetes and Foot Care Diabetes may cause you to have problems because of poor blood supply (circulation) to your feet and legs. This may cause the skin on your feet to become thinner, break easier, and heal more slowly. Your skin may become dry, and the skin may peel and crack. You may also have nerve damage in your legs and feet causing decreased feeling in them. You may not notice minor injuries to your feet that could lead to infections or more serious problems. Taking care of your feet is one of the most important things you can do for yourself. Follow these instructions at home:  Wear shoes at all times, even in the house. Do not go barefoot. Bare feet are easily injured.  Check your feet daily for blisters, cuts, and redness. If you cannot see the bottom of your feet, use a mirror or ask someone for help.  Wash your feet with warm water (do not use hot water) and mild soap. Then pat your feet and the areas between your toes until they are completely dry. Do not soak your feet as this can dry your skin.  Apply a moisturizing lotion or petroleum jelly (that does not contain alcohol and is unscented) to the skin on your feet and to dry, brittle toenails. Do not apply lotion between your toes.  Trim your toenails straight across. Do not dig under them or around the cuticle. File the edges of your nails with an emery board or nail file.  Do not cut corns or calluses or try to remove them with medicine.  Wear clean socks or stockings every day. Make sure they are not too tight. Do not wear knee-high stockings since they may decrease blood flow to your legs.  Wear shoes that fit properly and have enough cushioning. To break in new shoes, wear them for just a few hours a day. This prevents you from injuring your feet. Always look in your shoes before you put them on to be sure there are no objects inside.  Do not cross your legs. This may decrease the blood flow to your feet.  If you find a  minor scrape, cut, or break in the skin on your feet, keep it and the skin around it clean and dry. These areas may be cleansed with mild soap and water. Do not cleanse the area with peroxide, alcohol, or iodine.  When you remove an adhesive bandage, be sure not to damage the skin around it.  If you have a wound, look at it several times a day to make sure it is healing.  Do not use heating pads or hot water bottles. They may burn your skin. If you have lost feeling in your feet or legs, you may not know it is happening until it is too late.  Make sure your health care provider performs a complete foot exam at least annually or more often if you have foot problems. Report any cuts, sores, or bruises to your health care provider immediately. Contact a health care provider if:  You have an injury that is not healing.  You have cuts or breaks in the skin.  You have an ingrown nail.  You notice redness on your legs or feet.  You feel burning or tingling in your legs or feet.  You have pain or cramps in your legs and feet.  Your legs or feet are numb.  Your feet always feel cold. Get help right away if:  There is increasing   redness, swelling, or pain in or around a wound.  There is a red line that goes up your leg.  Pus is coming from a wound.  You develop a fever or as directed by your health care provider.  You notice a bad smell coming from an ulcer or wound. This information is not intended to replace advice given to you by your health care provider. Make sure you discuss any questions you have with your health care provider. Document Released: 06/29/2000 Document Revised: 12/08/2015 Document Reviewed: 12/09/2012 Elsevier Interactive Patient Education  2017 [place] are small areas of thickened skin that occur on the top, sides, or tip of a toe. They contain a cone-shaped core with a point that can press on a nerve below. This causes pain.  Calluses are areas of thickened skin that can occur anywhere on the body including hands, fingers, palms, soles of the feet, and heels.Calluses are usually larger than corns. What are the causes? Corns and calluses are caused by rubbing (friction) or pressure, such as from shoes that are too tight or do not fit properly. What increases the risk? Corns are more likely to develop in people who have toe deformities, such as hammer toes. Since calluses can occur with friction to any area of the skin, calluses are more likely to develop in people who:  Work with their hands.  Wear shoes that fit poorly, shoes that are too tight, or shoes that are high-heeled.  Have toes deformities.  What are the signs or symptoms? Symptoms of a corn or callus include:  A hard growth on the skin.  Pain or tenderness under the skin.  Redness and swelling.  Increased discomfort while wearing tight-fitting shoes.  How is this diagnosed? Corns and calluses may be diagnosed with a medical history and physical exam. How is this treated? Corns and calluses may be treated with:  Removing the cause of the friction or pressure. This may include: ? Changing your shoes. ? Wearing shoe inserts (orthotics) or other protective layers in your shoes, such as a corn pad. ? Wearing gloves.  Medicines to help soften skin in the hardened, thickened areas.  Reducing the size of the corn or callus by removing the dead layers of skin.  Antibiotic medicines to treat infection.  Surgery, if a toe deformity is the cause.  Follow these instructions at home:  Take medicines only as directed by your health care provider.  If you were prescribed an antibiotic, finish all of it even if you start to feel better.  Wear shoes that fit well. Avoid wearing high-heeled shoes and shoes that are too tight or too loose.  Wear any padding, protective layers, gloves, or orthotics as directed by your health care provider.  Soak  your hands or feet and then use a file or pumice stone to soften your corn or callus. Do this as directed by your health care provider.  Check your corn or callus every day for signs of infection. Watch for: ? Redness, swelling, or pain. ? Fluid, blood, or pus. Contact a health care provider if:  Your symptoms do not improve with treatment.  You have increased redness, swelling, or pain at the site of your corn or callus.  You have fluid, blood, or pus coming from your corn or callus.  You have new symptoms. This information is not intended to replace advice given to you by your health care provider. Make sure you discuss any  questions you have with your health care provider. Document Released: 04/07/2004 Document Revised: 01/20/2016 Document Reviewed: 06/28/2014 Elsevier Interactive Patient Education  2018 [place] Toe Hammer toe is a change in the shape (a deformity) of your second, third, or fourth toe. The deformity causes the middle joint of your toe to stay bent. This causes pain, especially when you are wearing shoes. Hammer toe starts gradually. At first, the toe can be straightened. Gradually over time, the deformity becomes stiff and permanent. Early treatments to keep the toe straight may relieve pain. As the deformity becomes stiff and permanent, surgery may be needed to straighten the toe. What are the causes? Hammer toe is caused by abnormal bending of the toe joint that is closest to your foot. It happens gradually over time. This pulls on the muscles and connections (tendons) of the toe joint, making them weak and stiff. It is often related to wearing shoes that are too short or narrow and do not let your toes straighten. What increases the risk? You may be at greater risk for hammer toe if you:  Are female.  Are older.  Wear shoes that are too small.  Wear high-heeled shoes that pinch your toes.  Are a Engineer, mining.  Have a second toe that is longer  than your big toe (first toe).  Injure your foot or toe.  Have arthritis.  Have a family history of hammer toe.  Have a nerve or muscle disorder.  What are the signs or symptoms? The main symptoms of this condition are pain and deformity of the toe. The pain is worse when wearing shoes, walking, or running. Other symptoms may include:  Corns or calluses over the bent part of the toe or between the toes.  Redness and a burning feeling on the toe.  An open sore that forms on the top of the toe.  Not being able to straighten the toe.  How is this diagnosed? This condition is diagnosed based on your symptoms and a physical exam. During the exam, your health care provider will try to straighten your toe to see how stiff the deformity is. You may also have tests, such as:  A blood test to check for rheumatoid arthritis.  An X-ray to show how severe the deformity is.  How is this treated? Treatment for this condition will depend on how stiff the deformity is. Surgery is often needed. However, sometimes a hammer toe can be straightened without surgery. Treatments that do not involve surgery include:  Taping the toe into a straightened position.  Using pads and cushions to protect the toe (orthotics).  Wearing shoes that provide enough room for the toes.  Doing toe-stretching exercises at home.  Taking an NSAID to reduce pain and swelling.  If these treatments do not help or the toe cannot be straightened, surgery is the next option. The most common surgeries used to straighten a hammer toe include:  Arthroplasty. In this procedure, part of the joint is removed, and that allows the toe to straighten.  Fusion. In this procedure, cartilage between the two bones of the joint is taken out and the bones are fused together into one longer bone.  Implantation. In this procedure, part of the bone is removed and replaced with an implant to let the toe move again.  Flexor tendon  transfer. In this procedure, the tendons that curl the toes down (flexor tendons) are repositioned.  Follow these instructions at home:  Take over-the-counter and  prescription medicines only as told by your health care provider.  Do toe straightening and stretching exercises as told by your health care provider.  Keep all follow-up visits as told by your health care provider. This is important. How is this prevented?  Wear shoes that give your toes enough room and do not cause pain.  Do not wear high-heeled shoes. Contact a health care provider if:  Your pain gets worse.  Your toe becomes red or swollen.  You develop an open sore on your toe. This information is not intended to replace advice given to you by your health care provider. Make sure you discuss any questions you have with your health care provider. Document Released: 06/29/2000 Document Revised: 01/20/2016 Document Reviewed: 10/26/2015 Elsevier Interactive Patient Education  [employer].

## 2018-06-10 ENCOUNTER — Ambulatory Visit: Payer: Self-pay

## 2018-06-10 ENCOUNTER — Encounter: Payer: Self-pay | Admitting: Internal Medicine

## 2018-06-10 MED ORDER — GABAPENTIN 100 MG PO CAPS: 100.0000 mg | ORAL_CAPSULE | Freq: Two times a day (BID) | ORAL | 3 refills | Status: DC

## 2018-06-10 NOTE — Telephone Encounter (Signed)
She had a prescription for gabapentin about 3 years ago. Is this what she is referring to? We have sent that in to use at night time if needed for 3 days then increase to twice a day as needed. Can cause drowsiness or falls so be cautious.

## 2018-06-10 NOTE — Telephone Encounter (Signed)
LVM for patient to call back for MD response  

## 2018-06-10 NOTE — Telephone Encounter (Signed)
Patient called, left VM to return call to the office to discuss her symptoms of feet burning. Patient is requesting medication.

## 2018-06-10 NOTE — Telephone Encounter (Signed)
Patient called and she says she has burning to the bottom of her feet and would like the medication that she took before, about a year or so ago, for it. She says Dr. Sharlet Salina knows about her feet and this is not a new issue, so she doesn't want an appointment. I asked about pain, she says it's not painful, just burns and sometimes tingle, no numbness, no weakness. She says she is able to walk around. I advised I will send this request to Dr. Sharlet Salina and someone will call with her recommendation.   Pharmacy: [facility] [place], [place] - [address] [phone] (Phone) [phone] (Fax)      Reason for Disposition . Caller requesting a NON-URGENT new prescription or refill and triager unable to refill per unit policy  Answer Assessment - Initial Assessment Questions 1. SYMPTOMS: "Do you have any symptoms?"     Burning to my feet 2. SEVERITY: If symptoms are present, ask "Are they mild, moderate or severe?"     Uncomfortable  Protocols used: MEDICATION QUESTION CALL-A-AH

## 2018-06-10 NOTE — Telephone Encounter (Signed)
This encounter was created in error - please disregard.

## 2018-06-10 NOTE — Telephone Encounter (Signed)
Message from Dr. Sharlet Salina read to patient; verbalizes understanding. States she can not recall what medication she was on "But that sounds right."

## 2018-06-16 ENCOUNTER — Other Ambulatory Visit: Payer: Self-pay | Admitting: Internal Medicine

## 2018-06-16 NOTE — Telephone Encounter (Signed)
Control database checked last refill: 09/10/2017 LOV: 04/01/2018 CPE NOV:09/02/2018

## 2018-06-22 ENCOUNTER — Encounter: Payer: Self-pay | Admitting: Podiatry

## 2018-06-22 NOTE — Progress Notes (Signed)
Subjective: Kimberly Aguirre presents with diabetes and cc of painful, discolored, thick toenails and painful corns which interfere with activities of daily living. Pain is aggravated when wearing enclosed shoe gear and  is relieved with periodic professional debridement.   Allergies  Allergen Reactions  . Codeine     REACTION: nausea   ROS:  Neuro: foot tingling  Vascular Examination: Capillary refill time <3 seconds x 10 digits Dorsalis pedis and Posterior tibial pulses present b/l No digital hair x 10 digits Skin temperature warm to cool b/l  Dermatological Examination: Skin with normal turgor, texture and tone b/l  Toenails 1-5 b/l discolored, thick, dystrophic with subungual debris and pain with palpation to nailbeds due to thickness of nails.  Hyperkeratotic lesion  Dorsally b/l 4th PIPJ and right 5th PIPJ  Musculoskeletal: Muscle strength 5/5 to all LE muscle groups  Neurological: Sensation diminished with 10 gram monofilament.  Vibratory sensation diminished  Assessment: 1. Painful onychomycosis toenails 1-5 b/l 2. Corns b/l 4th digits, right 5th digit 3. NIDDM with Diabetic neuropathy  Plan: 1. ABN signed today. 2. Continue diabetic foot care principles.  3. Toenails 1-5 b/l were debrided in length and girth without iatrogenic bleeding. 4. Hyperkeratotic lesion pared with sterile chisel blade b/l 4th digits, right 5th digit 5. Patient to continue soft, supportive shoe gear 6. Patient to report any pedal injuries to medical professional  7. Follow up 3 months. Patient/POA to call should there be a concern in the interim.

## 2018-09-02 ENCOUNTER — Ambulatory Visit (INDEPENDENT_AMBULATORY_CARE_PROVIDER_SITE_OTHER): Payer: Medicare Other | Admitting: Internal Medicine

## 2018-09-02 ENCOUNTER — Other Ambulatory Visit (INDEPENDENT_AMBULATORY_CARE_PROVIDER_SITE_OTHER): Payer: Medicare Other

## 2018-09-02 ENCOUNTER — Encounter: Payer: Self-pay | Admitting: Internal Medicine

## 2018-09-02 VITALS — BP 100/58 | HR 78 | Temp 97.4°F | Ht 61.0 in | Wt 120.0 lb

## 2018-09-02 DIAGNOSIS — E118 Type 2 diabetes mellitus with unspecified complications: Secondary | ICD-10-CM | POA: Diagnosis not present

## 2018-09-02 LAB — HEMOGLOBIN A1C: Hgb A1c MFr Bld: 7.9 % — ABNORMAL HIGH (ref 4.6–6.5)

## 2018-09-02 MED ORDER — DICLOFENAC SODIUM 1 % TD GEL: 2.0000 g | Freq: Four times a day (QID) | TRANSDERMAL | 6 refills | Status: DC

## 2018-09-02 MED ORDER — CLORAZEPATE DIPOTASSIUM 7.5 MG PO TABS: 7.5000 mg | ORAL_TABLET | Freq: Every day | ORAL | 1 refills | Status: DC | PRN

## 2018-09-02 NOTE — Progress Notes (Signed)
   Subjective:   Patient ID: Kimberly Aguirre, female    DOB: Jul 08, 1934, 85 y.o.   MRN: [id number]  HPI The patient is an 85 YO female coming in for follow up of diabetes. She is still having some burning in the bottom of her feet. She did try the gabapentin but just didn't want to take any pills anymore so stopped. She is wondering if she could get a cream instead to use at night. Bought a dollar store cream to use at night time and this does not help much.   Review of Systems  Constitutional: Negative.   HENT: Negative.   Eyes: Negative.   Respiratory: Negative for cough, chest tightness and shortness of breath.   Cardiovascular: Negative for chest pain, palpitations and leg swelling.  Gastrointestinal: Negative for abdominal distention, abdominal pain, constipation, diarrhea, nausea and vomiting.  Musculoskeletal: Negative.   Skin: Negative.   Neurological: Positive for numbness.  Psychiatric/Behavioral: Negative.     Objective:  Physical Exam Constitutional:      Appearance: She is well-developed.  HENT:     Head: Normocephalic and atraumatic.  Neck:     Musculoskeletal: Normal range of motion.  Cardiovascular:     Rate and Rhythm: Normal rate and regular rhythm.  Pulmonary:     Effort: Pulmonary effort is normal. No respiratory distress.     Breath sounds: Normal breath sounds. No wheezing or rales.  Abdominal:     General: Bowel sounds are normal. There is no distension.     Palpations: Abdomen is soft.     Tenderness: There is no abdominal tenderness. There is no rebound.  Skin:    General: Skin is warm and dry.  Neurological:     Mental Status: She is alert and oriented to person, place, and time.     Coordination: Coordination normal.     Vitals:   09/02/18 1002  BP: (!) 100/58  Pulse: 78  Temp: (!) 97.4 F (36.3 C)  TempSrc: Oral  SpO2: 97%  Weight: 120 lb (54.4 kg)  Height: 5\' 1"  (1.549 m)    Assessment & Plan:

## 2018-09-02 NOTE — Patient Instructions (Signed)
We will check the labs today for the sugars.   We have sent in the cream for the feet to use.

## 2018-09-02 NOTE — Assessment & Plan Note (Addendum)
Rx for voltaren gel for pain. Checking HgA1c and adjust metformin as needed.

## 2018-09-03 ENCOUNTER — Encounter: Payer: Self-pay | Admitting: Podiatry

## 2018-09-03 ENCOUNTER — Ambulatory Visit (INDEPENDENT_AMBULATORY_CARE_PROVIDER_SITE_OTHER): Payer: Medicare Other | Admitting: Podiatry

## 2018-09-03 DIAGNOSIS — B351 Tinea unguium: Secondary | ICD-10-CM | POA: Diagnosis not present

## 2018-09-03 DIAGNOSIS — E1142 Type 2 diabetes mellitus with diabetic polyneuropathy: Secondary | ICD-10-CM

## 2018-09-03 DIAGNOSIS — M79675 Pain in left toe(s): Secondary | ICD-10-CM

## 2018-09-03 DIAGNOSIS — M79674 Pain in right toe(s): Secondary | ICD-10-CM | POA: Diagnosis not present

## 2018-09-03 DIAGNOSIS — L84 Corns and callosities: Secondary | ICD-10-CM

## 2018-09-03 NOTE — Progress Notes (Signed)
Subjective: Kimberly Aguirre presents with diabetes, diabetic neuropathy and cc of painful, discolored, thick toenails and painful callus/corn which interfere with activities of daily living. Pain is aggravated when wearing enclosed shoe gear. Pain is relieved with periodic professional debridement.  Hoyt Koch, MD is her PCP and last visit was 09/02/2018.   Current Outpatient Medications:  .  aspirin 81 MG tablet, Take 81 mg by mouth daily as needed for pain. , Disp: , Rfl:  .  blood glucose meter kit and supplies, Dispense based on patient and insurance preference. Use up to four times daily as directed. (FOR ICD-10 E11.8), Disp: 1 each, Rfl: 0 .  Blood Glucose Monitoring Suppl (ONE TOUCH ULTRA 2) w/Device KIT, Use to check blood sugars twice a day Dx E11.8, Disp: 1 each, Rfl: 0 .  clorazepate (TRANXENE) 7.5 MG tablet, Take 1 tablet (7.5 mg total) by mouth daily as needed., Disp: 90 tablet, Rfl: 1 .  diclofenac sodium (VOLTAREN) 1 % GEL, Apply 2 g topically 4 (four) times daily., Disp: 100 g, Rfl: 6 .  glucose blood (ONE TOUCH ULTRA TEST) test strip, Use to check blood sugars twice a day Dx E11.8, Disp: 100 each, Rfl: 2 .  Lancets (ACCU-CHEK SOFT TOUCH) lancets, Used to check blood sugars twice a day, Disp: 100 each, Rfl: 1 .  metFORMIN (GLUCOPHAGE) 500 MG tablet, Take 1 tablet (500 mg total) by mouth 2 (two) times daily with a meal., Disp: 180 tablet, Rfl: 3 .  Multiple Vitamins-Minerals (CENTRUM SILVER PO), Take 1 tablet by mouth daily.  , Disp: , Rfl:  .  triamcinolone cream (KENALOG) 0.1 %, Apply 1 application topically 2 (two) times daily., Disp: 30 g, Rfl: 0  Allergies  Allergen Reactions  . Codeine     REACTION: nausea    Vascular Examination: Capillary refill time <3 seconds x 10 digits Dorsalis pedis and Posterior tibial pulses present b/l No digital hair x 10 digits Skin temperature warm to cool b/l  Dermatological Examination: Skin with normal turgor, texture and  tone b/l  Toenails 1-5 b/l discolored, thick, dystrophic with subungual debris and pain with palpation to nailbeds due to thickness of nails.  Hyperkeratotic lesion dorsal PIPJ b/l 4th digit, right 5th digit. No erythema, no edema, no drainage, no flocculence.  Musculoskeletal: Muscle strength 5/5 to all LE muscle groups  Hammertoe deformity b/l 4th digits, right 5th digit  Neurological: Sensation diminished with 10 gram monofilament.  Assessment: 1. Painful onychomycosis toenails 1-5 b/l 2. Corns b/l 4th digits, right 5th digit 3. NIDDM with Diabetic neuropathy  Plan: 1. Continue diabetic foot care principles.  2. Toenails 1-5 b/l were debrided in length and girth without iatrogenic bleeding. 3. Hyperkeratotic lesion pared b/l 4th and right 5th digits with sterile scalpel blade. 4. Patient to continue soft, supportive shoe gear. 5. Patient to report any pedal injuries to medical professional immediately. 6. Follow up 3 months.  7. Patient/POA to call should there be a concern in the interim.

## 2018-09-03 NOTE — Patient Instructions (Signed)
Onychomycosis/Fungal Toenails  WHAT IS IT? An infection that lies within the keratin of your nail plate that is caused by a fungus.  WHY ME? Fungal infections affect all ages, sexes, races, and creeds.  There may be many factors that predispose you to a fungal infection such as age, coexisting medical conditions such as diabetes, or an autoimmune disease; stress, medications, fatigue, genetics, etc.  Bottom line: fungus thrives in a warm, moist environment and your shoes offer such a location.  IS IT CONTAGIOUS? Theoretically, yes.  You do not want to share shoes, nail clippers or files with someone who has fungal toenails.  Walking around barefoot in the same room or sleeping in the same bed is unlikely to transfer the organism.  It is important to realize, however, that fungus can spread easily from one nail to the next on the same foot.  HOW DO WE TREAT THIS?  There are several ways to treat this condition.  Treatment may depend on many factors such as age, medications, pregnancy, liver and kidney conditions, etc.  It is best to ask your doctor which options are available to you.  1. No treatment.   Unlike many other medical concerns, you can live with this condition.  However for many people this can be a painful condition and may lead to ingrown toenails or a bacterial infection.  It is recommended that you keep the nails cut short to help reduce the amount of fungal nail. 2. Topical treatment.  These range from herbal remedies to prescription strength nail lacquers.  About 40-50% effective, topicals require twice daily application for approximately 9 to 12 months or until an entirely new nail has grown out.  The most effective topicals are medical grade medications available through physicians offices. 3. Oral antifungal medications.  With an 80-90% cure rate, the most common oral medication requires 3 to 4 months of therapy and stays in your system for a year as the new nail grows out.  Oral  antifungal medications do require blood work to make sure it is a safe drug for you.  A liver function panel will be performed prior to starting the medication and after the first month of treatment.  It is important to have the blood work performed to avoid any harmful side effects.  In general, this medication safe but blood work is required. 4. Laser Therapy.  This treatment is performed by applying a specialized laser to the affected nail plate.  This therapy is noninvasive, fast, and non-painful.  It is not covered by insurance and is therefore, out of pocket.  The results have been very good with a 80-95% cure rate.  [facility] is the only practice in the area to offer this therapy. Permanent Nail Avulsion.  Removing the entire nail so that a new nail will not grow back.Corns and Calluses Corns are small areas of thickened skin that occur on the top, sides, or tip of a toe. They contain a cone-shaped core with a point that can press on a nerve below. This causes pain.  Calluses are areas of thickened skin that can occur anywhere on the body, including the hands, fingers, palms, soles of the feet, and heels. Calluses are usually larger than corns. What are the causes? Corns and calluses are caused by rubbing (friction) or pressure, such as from shoes that are too tight or do not fit properly. What increases the risk? Corns are more likely to develop in people who have misshapen   toes (toe deformities), such as hammer toes. Calluses can occur with friction to any area of the skin. They are more likely to develop in people who:  Work with their hands.  Wear shoes that fit poorly, are too tight, or are high-heeled.  Have toe deformities. What are the signs or symptoms? Symptoms of a corn or callus include:  A hard growth on the skin.  Pain or tenderness under the skin.  Redness and swelling.  Increased discomfort while wearing tight-fitting shoes, if your feet are affected. If a  corn or callus becomes infected, symptoms may include:  Redness and swelling that gets worse.  Pain.  Fluid, blood, or pus draining from the corn or callus. How is this diagnosed? Corns and calluses may be diagnosed based on your symptoms, your medical history, and a physical exam. How is this treated? Treatment for corns and calluses may include:  Removing the cause of the friction or pressure. This may involve: ? Changing your shoes. ? Wearing shoe inserts (orthotics) or other protective layers in your shoes, such as a corn pad. ? Wearing gloves.  Applying medicine to the skin (topical medicine) to help soften skin in the hardened, thickened areas.  Removing layers of dead skin with a file to reduce the size of the corn or callus.  Removing the corn or callus with a scalpel or laser.  Taking antibiotic medicines, if your corn or callus is infected.  Having surgery, if a toe deformity is the cause. Follow these instructions at home:   Take over-the-counter and prescription medicines only as told by your health care provider.  If you were prescribed an antibiotic, take it as told by your health care provider. Do not stop taking it even if your condition starts to improve.  Wear shoes that fit well. Avoid wearing high-heeled shoes and shoes that are too tight or too loose.  Wear any padding, protective layers, gloves, or orthotics as told by your health care provider.  Soak your hands or feet and then use a file or pumice stone to soften your corn or callus. Do this as told by your health care provider.  Check your corn or callus every day for symptoms of infection. Contact a health care provider if you:  Notice that your symptoms do not improve with treatment.  Have redness or swelling that gets worse.  Notice that your corn or callus becomes painful.  Have fluid, blood, or pus coming from your corn or callus.  Have new symptoms. Summary  Corns are small areas of  thickened skin that occur on the top, sides, or tip of a toe.  Calluses are areas of thickened skin that can occur anywhere on the body, including the hands, fingers, palms, and soles of the feet. Calluses are usually larger than corns.  Corns and calluses are caused by rubbing (friction) or pressure, such as from shoes that are too tight or do not fit properly.  Treatment may include wearing any padding, protective layers, gloves, or orthotics as told by your health care provider. This information is not intended to replace advice given to you by your health care provider. Make sure you discuss any questions you have with your health care provider. Document Released: 04/07/2004 Document Revised: 05/15/2017 Document Reviewed: 05/15/2017 Elsevier Interactive Patient Education  2019 [employer] Diabetes Mellitus and Foot Care Foot care is an important part of your health, especially when you have diabetes. Diabetes may cause you to have problems because of poor   blood flow (circulation) to your feet and legs, which can cause your skin to:  Become thinner and drier.  Break more easily.  Heal more slowly.  Peel and crack. You may also have nerve damage (neuropathy) in your legs and feet, causing decreased feeling in them. This means that you may not notice minor injuries to your feet that could lead to more serious problems. Noticing and addressing any potential problems early is the best way to prevent future foot problems. How to care for your feet Foot hygiene  Wash your feet daily with warm water and mild soap. Do not use hot water. Then, pat your feet and the areas between your toes until they are completely dry. Do not soak your feet as this can dry your skin.  Trim your toenails straight across. Do not dig under them or around the cuticle. File the edges of your nails with an emery board or nail file.  Apply a moisturizing lotion or petroleum jelly to the skin on your feet and to  dry, brittle toenails. Use lotion that does not contain alcohol and is unscented. Do not apply lotion between your toes. Shoes and socks  Wear clean socks or stockings every day. Make sure they are not too tight. Do not wear knee-high stockings since they may decrease blood flow to your legs.  Wear shoes that fit properly and have enough cushioning. Always look in your shoes before you put them on to be sure there are no objects inside.  To break in new shoes, wear them for just a few hours a day. This prevents injuries on your feet. Wounds, scrapes, corns, and calluses  Check your feet daily for blisters, cuts, bruises, sores, and redness. If you cannot see the bottom of your feet, use a mirror or ask someone for help.  Do not cut corns or calluses or try to remove them with medicine.  If you find a minor scrape, cut, or break in the skin on your feet, keep it and the skin around it clean and dry. You may clean these areas with mild soap and water. Do not clean the area with peroxide, alcohol, or iodine.  If you have a wound, scrape, corn, or callus on your foot, look at it several times a day to make sure it is healing and not infected. Check for: ? Redness, swelling, or pain. ? Fluid or blood. ? Warmth. ? Pus or a bad smell. General instructions  Do not cross your legs. This may decrease blood flow to your feet.  Do not use heating pads or hot water bottles on your feet. They may burn your skin. If you have lost feeling in your feet or legs, you may not know this is happening until it is too late.  Protect your feet from hot and cold by wearing shoes, such as at the beach or on hot pavement.  Schedule a complete foot exam at least once a year (annually) or more often if you have foot problems. If you have foot problems, report any cuts, sores, or bruises to your health care provider immediately. Contact a health care provider if:  You have a medical condition that increases your  risk of infection and you have any cuts, sores, or bruises on your feet.  You have an injury that is not healing.  You have redness on your legs or feet.  You feel burning or tingling in your legs or feet.  You have pain or cramps in   your legs and feet.  Your legs or feet are numb.  Your feet always feel cold.  You have pain around a toenail. Get help right away if:  You have a wound, scrape, corn, or callus on your foot and: ? You have pain, swelling, or redness that gets worse. ? You have fluid or blood coming from the wound, scrape, corn, or callus. ? Your wound, scrape, corn, or callus feels warm to the touch. ? You have pus or a bad smell coming from the wound, scrape, corn, or callus. ? You have a fever. ? You have a red line going up your leg. Summary  Check your feet every day for cuts, sores, red spots, swelling, and blisters.  Moisturize feet and legs daily.  Wear shoes that fit properly and have enough cushioning.  If you have foot problems, report any cuts, sores, or bruises to your health care provider immediately.  Schedule a complete foot exam at least once a year (annually) or more often if you have foot problems. This information is not intended to replace advice given to you by your health care provider. Make sure you discuss any questions you have with your health care provider. Document Released: 06/29/2000 Document Revised: 08/14/2017 Document Reviewed: 08/03/2016 Elsevier Interactive Patient Education  2019 [employer] Inc[employer]  

## 2018-09-04 ENCOUNTER — Ambulatory Visit: Payer: Medicare Other | Admitting: Podiatry

## 2018-09-18 ENCOUNTER — Ambulatory Visit: Payer: Medicare Other | Admitting: Internal Medicine

## 2018-09-23 ENCOUNTER — Other Ambulatory Visit: Payer: Self-pay | Admitting: Internal Medicine

## 2018-09-30 ENCOUNTER — Ambulatory Visit: Payer: Medicare Other | Admitting: Internal Medicine

## 2018-10-02 ENCOUNTER — Ambulatory Visit: Payer: Medicare Other | Admitting: Internal Medicine

## 2018-10-03 ENCOUNTER — Other Ambulatory Visit: Payer: Self-pay | Admitting: Internal Medicine

## 2018-12-03 ENCOUNTER — Ambulatory Visit: Payer: Medicare Other | Admitting: Podiatry

## 2018-12-05 ENCOUNTER — Telehealth: Payer: Self-pay | Admitting: Internal Medicine

## 2018-12-05 NOTE — Telephone Encounter (Signed)
Can come June or July for visit

## 2018-12-05 NOTE — Telephone Encounter (Signed)
Patient is scheduled for June.

## 2018-12-05 NOTE — Telephone Encounter (Signed)
Patient has a covid risk of 5 when would you like to see her back for a follow up. She doesn't want to do virtual and doesn't want to wait 6 months

## 2018-12-05 NOTE — Telephone Encounter (Signed)
Noted thanks °

## 2018-12-05 NOTE — Telephone Encounter (Signed)
Patient is requesting to come into the office to be seen.  Patient does not want to wait 6 months without being seen.  Patient is concerned about her diabetes and BP.

## 2018-12-09 ENCOUNTER — Ambulatory Visit: Payer: Medicare Other | Admitting: Internal Medicine

## 2018-12-22 ENCOUNTER — Other Ambulatory Visit: Payer: Self-pay | Admitting: Internal Medicine

## 2018-12-22 NOTE — Telephone Encounter (Signed)
Control database checked last refill: 09/16/2018 LOV; 09/02/2018, CPE 04/01/2018 NOV: 01/06/2019

## 2018-12-24 ENCOUNTER — Ambulatory Visit (INDEPENDENT_AMBULATORY_CARE_PROVIDER_SITE_OTHER): Payer: Medicare Other | Admitting: Podiatry

## 2018-12-24 ENCOUNTER — Other Ambulatory Visit: Payer: Self-pay

## 2018-12-24 ENCOUNTER — Encounter: Payer: Self-pay | Admitting: Podiatry

## 2018-12-24 DIAGNOSIS — L84 Corns and callosities: Secondary | ICD-10-CM

## 2018-12-24 DIAGNOSIS — E1142 Type 2 diabetes mellitus with diabetic polyneuropathy: Secondary | ICD-10-CM

## 2018-12-24 DIAGNOSIS — M79674 Pain in right toe(s): Secondary | ICD-10-CM

## 2018-12-24 DIAGNOSIS — M79675 Pain in left toe(s): Secondary | ICD-10-CM | POA: Diagnosis not present

## 2018-12-24 DIAGNOSIS — B351 Tinea unguium: Secondary | ICD-10-CM | POA: Diagnosis not present

## 2018-12-24 NOTE — Patient Instructions (Signed)
Diabetes Mellitus and Foot Care  Foot care is an important part of your health, especially when you have diabetes. Diabetes may cause you to have problems because of poor blood flow (circulation) to your feet and legs, which can cause your skin to:   Become thinner and drier.   Break more easily.   Heal more slowly.   Peel and crack.  You may also have nerve damage (neuropathy) in your legs and feet, causing decreased feeling in them. This means that you may not notice minor injuries to your feet that could lead to more serious problems. Noticing and addressing any potential problems early is the best way to prevent future foot problems.  How to care for your feet  Foot hygiene   Wash your feet daily with warm water and mild soap. Do not use hot water. Then, pat your feet and the areas between your toes until they are completely dry. Do not soak your feet as this can dry your skin.   Trim your toenails straight across. Do not dig under them or around the cuticle. File the edges of your nails with an emery board or nail file.   Apply a moisturizing lotion or petroleum jelly to the skin on your feet and to dry, brittle toenails. Use lotion that does not contain alcohol and is unscented. Do not apply lotion between your toes.  Shoes and socks   Wear clean socks or stockings every day. Make sure they are not too tight. Do not wear knee-high stockings since they may decrease blood flow to your legs.   Wear shoes that fit properly and have enough cushioning. Always look in your shoes before you put them on to be sure there are no objects inside.   To break in new shoes, wear them for just a few hours a day. This prevents injuries on your feet.  Wounds, scrapes, corns, and calluses   Check your feet daily for blisters, cuts, bruises, sores, and redness. If you cannot see the bottom of your feet, use a mirror or ask someone for help.   Do not cut corns or calluses or try to remove them with medicine.   If you  find a minor scrape, cut, or break in the skin on your feet, keep it and the skin around it clean and dry. You may clean these areas with mild soap and water. Do not clean the area with peroxide, alcohol, or iodine.   If you have a wound, scrape, corn, or callus on your foot, look at it several times a day to make sure it is healing and not infected. Check for:  ? Redness, swelling, or pain.  ? Fluid or blood.  ? Warmth.  ? Pus or a bad smell.  General instructions   Do not cross your legs. This may decrease blood flow to your feet.   Do not use heating pads or hot water bottles on your feet. They may burn your skin. If you have lost feeling in your feet or legs, you may not know this is happening until it is too late.   Protect your feet from hot and cold by wearing shoes, such as at the beach or on hot pavement.   Schedule a complete foot exam at least once a year (annually) or more often if you have foot problems. If you have foot problems, report any cuts, sores, or bruises to your health care provider immediately.  Contact a health care provider if:     You have a medical condition that increases your risk of infection and you have any cuts, sores, or bruises on your feet.   You have an injury that is not healing.   You have redness on your legs or feet.   You feel burning or tingling in your legs or feet.   You have pain or cramps in your legs and feet.   Your legs or feet are numb.   Your feet always feel cold.   You have pain around a toenail.  Get help right away if:   You have a wound, scrape, corn, or callus on your foot and:  ? You have pain, swelling, or redness that gets worse.  ? You have fluid or blood coming from the wound, scrape, corn, or callus.  ? Your wound, scrape, corn, or callus feels warm to the touch.  ? You have pus or a bad smell coming from the wound, scrape, corn, or callus.  ? You have a fever.  ? You have a red line going up your leg.  Summary   Check your feet every day  for cuts, sores, red spots, swelling, and blisters.   Moisturize feet and legs daily.   Wear shoes that fit properly and have enough cushioning.   If you have foot problems, report any cuts, sores, or bruises to your health care provider immediately.   Schedule a complete foot exam at least once a year (annually) or more often if you have foot problems.  This information is not intended to replace advice given to you by your health care provider. Make sure you discuss any questions you have with your health care provider.  Document Released: 06/29/2000 Document Revised: 08/14/2017 Document Reviewed: 08/03/2016  Elsevier Interactive Patient Education  2019 [employer]

## 2019-01-06 ENCOUNTER — Ambulatory Visit (INDEPENDENT_AMBULATORY_CARE_PROVIDER_SITE_OTHER): Payer: Medicare Other | Admitting: Internal Medicine

## 2019-01-06 ENCOUNTER — Encounter: Payer: Self-pay | Admitting: Internal Medicine

## 2019-01-06 ENCOUNTER — Other Ambulatory Visit (INDEPENDENT_AMBULATORY_CARE_PROVIDER_SITE_OTHER): Payer: Medicare Other

## 2019-01-06 ENCOUNTER — Other Ambulatory Visit: Payer: Self-pay

## 2019-01-06 VITALS — BP 130/70 | HR 84 | Temp 98.1°F | Ht 61.0 in | Wt 123.0 lb

## 2019-01-06 DIAGNOSIS — E785 Hyperlipidemia, unspecified: Secondary | ICD-10-CM

## 2019-01-06 DIAGNOSIS — E118 Type 2 diabetes mellitus with unspecified complications: Secondary | ICD-10-CM

## 2019-01-06 DIAGNOSIS — I10 Essential (primary) hypertension: Secondary | ICD-10-CM | POA: Diagnosis not present

## 2019-01-06 DIAGNOSIS — E1169 Type 2 diabetes mellitus with other specified complication: Secondary | ICD-10-CM | POA: Diagnosis not present

## 2019-01-06 LAB — COMPREHENSIVE METABOLIC PANEL
ALT: 8 U/L (ref 0–35)
AST: 14 U/L (ref 0–37)
Albumin: 4.2 g/dL (ref 3.5–5.2)
Alkaline Phosphatase: 72 U/L (ref 39–117)
BUN: 15 mg/dL (ref 6–23)
CO2: 29 mEq/L (ref 19–32)
Calcium: 9.5 mg/dL (ref 8.4–10.5)
Chloride: 100 mEq/L (ref 96–112)
Creatinine, Ser: 0.73 mg/dL (ref 0.40–1.20)
GFR: 92.07 mL/min (ref >60.00)
Glucose, Bld: 171 mg/dL — ABNORMAL HIGH (ref 70–99)
Potassium: 3.8 mEq/L (ref 3.5–5.1)
Sodium: 137 mEq/L (ref 135–145)
Total Bilirubin: 0.4 mg/dL (ref 0.2–1.2)
Total Protein: 7.4 g/dL (ref 6.0–8.3)

## 2019-01-06 LAB — LIPID PANEL
Cholesterol: 184 mg/dL (ref 0–200)
HDL: 50.9 mg/dL (ref >39.00)
LDL Cholesterol: 104 mg/dL — ABNORMAL HIGH (ref 0–99)
NonHDL: 133.47
Total CHOL/HDL Ratio: 4
Triglycerides: 146 mg/dL (ref 0.0–149.0)
VLDL: 29.2 mg/dL (ref 0.0–40.0)

## 2019-01-06 LAB — HEMOGLOBIN A1C: Hgb A1c MFr Bld: 8.3 % — ABNORMAL HIGH (ref 4.6–6.5)

## 2019-01-06 NOTE — Assessment & Plan Note (Signed)
Checking HgA1c and lipid panel and CMP. Adjust metformin as needed.

## 2019-01-06 NOTE — Assessment & Plan Note (Signed)
Checking lipid panel and adjust as needed. Not on statin currently.  °

## 2019-01-06 NOTE — Progress Notes (Signed)
   Subjective:   Patient ID: Kimberly Aguirre, female    DOB: May 26, 1934, 85 y.o.   MRN: [id number]  HPI The patient is an 85 YO female coming in for follow up of diabetes (taking metformin, denies side effects or new burning pains in the hands or feet, on no ACE-I or statin), and her cholesterol (not on statin currently, denies change in diet recently, some differences with the pandemic in exercise), and anxiety (using her clorazepate about the same, denies side effects, denies falls or memory changes, coping okay with the pandemic but feeling some isolated because of this).   Review of Systems  Constitutional: Negative.   HENT: Negative.   Eyes: Negative.   Respiratory: Negative for cough, chest tightness and shortness of breath.   Cardiovascular: Negative for chest pain, palpitations and leg swelling.  Gastrointestinal: Negative for abdominal distention, abdominal pain, constipation, diarrhea, nausea and vomiting.  Musculoskeletal: Negative.   Skin: Negative.   Neurological: Negative.   Psychiatric/Behavioral: Negative.     Objective:  Physical Exam Constitutional:      Appearance: She is well-developed.  HENT:     Head: Normocephalic and atraumatic.  Neck:     Musculoskeletal: Normal range of motion.  Cardiovascular:     Rate and Rhythm: Normal rate and regular rhythm.  Pulmonary:     Effort: Pulmonary effort is normal. No respiratory distress.     Breath sounds: Normal breath sounds. No wheezing or rales.  Abdominal:     General: Bowel sounds are normal. There is no distension.     Palpations: Abdomen is soft.     Tenderness: There is no abdominal tenderness. There is no rebound.  Skin:    General: Skin is warm and dry.  Neurological:     Mental Status: She is alert and oriented to person, place, and time.     Coordination: Coordination normal.     Vitals:   01/06/19 1009  BP: 130/70  Pulse: 84  Temp: 98.1 F (36.7 C)  TempSrc: Oral  SpO2: 99%  Weight: 123 lb  (55.8 kg)  Height: 5\' 1"  (1.549 m)    Assessment & Plan:

## 2019-01-06 NOTE — Assessment & Plan Note (Signed)
BP at goal off meds. If needed ACE-I or ARB would be first line option due to diabetes.

## 2019-01-06 NOTE — Progress Notes (Signed)
Subjective: Kimberly Aguirre presents with to clinic for preventative diabetic foot care. She is followed routinely for management of painful, discolored, thick toenails and corns which interfere with her activities of daily living. Pain is aggravated when wearing enclosed shoe gear and relieved with periodic professional debridement.  She voices no new pedal concerns on today's visit.  Hoyt Koch, MD is her PCP and last visit was 09/02/2018.   Current Outpatient Medications:  .  ACCU-CHEK AVIVA PLUS test strip, CHECK BLOOD SUGAR TWICE DAILY, Disp: 100 each, Rfl: 2 .  aspirin 81 MG tablet, Take 81 mg by mouth daily as needed for pain. , Disp: , Rfl:  .  blood glucose meter kit and supplies, Dispense based on patient and insurance preference. Use up to four times daily as directed. (FOR ICD-10 E11.8), Disp: 1 each, Rfl: 0 .  Blood Glucose Monitoring Suppl (ONE TOUCH ULTRA 2) w/Device KIT, Use to check blood sugars twice a day Dx E11.8, Disp: 1 each, Rfl: 0 .  clorazepate (TRANXENE) 7.5 MG tablet, TAKE 1 TABLET(7.5 MG) BY MOUTH DAILY AS NEEDED, Disp: 90 tablet, Rfl: 0 .  diclofenac sodium (VOLTAREN) 1 % GEL, Apply 2 g topically 4 (four) times daily., Disp: 100 g, Rfl: 6 .  Lancets (ACCU-CHEK SOFT TOUCH) lancets, Used to check blood sugars twice a day, Disp: 100 each, Rfl: 1 .  metFORMIN (GLUCOPHAGE) 500 MG tablet, TAKE 1 TABLET(500 MG) BY MOUTH TWICE DAILY WITH A MEAL, Disp: 180 tablet, Rfl: 1 .  Multiple Vitamins-Minerals (CENTRUM SILVER PO), Take 1 tablet by mouth daily.  , Disp: , Rfl:  .  triamcinolone cream (KENALOG) 0.1 %, Apply 1 application topically 2 (two) times daily., Disp: 30 g, Rfl: 0  Allergies  Allergen Reactions  . Codeine     REACTION: nausea    Objective: No significant changes in physical examination on today. No new findings. Vascular Examination: Capillary refill time <3 seconds x 10 digits.  Dorsalis pedis pulses present b/l.  Posterior tibial pulses  present b/l.  Digital hair present x 10 digits.  Skin temperature gradient WNL b/l.  Dermatological Examination: Skin with normal turgor, texture and tone b/l.  Toenails 1-5 b/l discolored, thick, dystrophic with subungual debris and pain with palpation to nailbeds due to thickness of nails.  Hyperkeratotic lesion(s) dorsal PIPJ b/l 4th digits, right 5th digit. No erythema, no edema, no drainage, no flocculence noted.   Musculoskeletal: Muscle strength 5/5 to all LE muscle groups.  Hammertoes b/l 4th and right 5th digit.  No pain, crepitus or joint limitation with passive/active ROM.  Neurological: Sensation diminished with 10 gram monofilament.  Assessment: 1. Painful onychomycosis toenails 1-5 b/l 2. Corns b/l 4th and right 5th digit 3. NIDDM with Diabetic neuropathy  Plan: 1. No new findings. No new orders on today's visit. 2. Continue diabetic foot care principles. Literature dispensed on today. 3. Toenails 1-5 b/l were debrided in length and girth without iatrogenic bleeding. 4. Corn(s) pared b/l 4th and right 5th digit utilizing sterile scalpel blade without incident.  5. Patient to continue soft, supportive shoe gear. 6. Patient to report any pedal injuries to medical professional. 7. Follow up 3 months.  8. Patient/POA to call should there be a concern in the interim.

## 2019-01-31 ENCOUNTER — Other Ambulatory Visit: Payer: Self-pay | Admitting: Internal Medicine
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
## 2019-03-31 ENCOUNTER — Other Ambulatory Visit: Payer: Self-pay

## 2019-03-31 ENCOUNTER — Ambulatory Visit (INDEPENDENT_AMBULATORY_CARE_PROVIDER_SITE_OTHER): Payer: Medicare Other | Admitting: Podiatry

## 2019-03-31 ENCOUNTER — Encounter: Payer: Self-pay | Admitting: Podiatry

## 2019-03-31 DIAGNOSIS — E1142 Type 2 diabetes mellitus with diabetic polyneuropathy: Secondary | ICD-10-CM | POA: Diagnosis not present

## 2019-03-31 DIAGNOSIS — M79675 Pain in left toe(s): Secondary | ICD-10-CM

## 2019-03-31 DIAGNOSIS — L84 Corns and callosities: Secondary | ICD-10-CM

## 2019-03-31 DIAGNOSIS — B351 Tinea unguium: Secondary | ICD-10-CM | POA: Diagnosis not present

## 2019-03-31 DIAGNOSIS — M79674 Pain in right toe(s): Secondary | ICD-10-CM | POA: Diagnosis not present

## 2019-03-31 NOTE — Patient Instructions (Signed)

## 2019-04-01 NOTE — Progress Notes (Signed)
Subjective: Kimberly Aguirre presents to clinic with h/o diabetic neuropathy and follow up of painful mycotic toenails and corns of both feet which are aggravated when weightbearing with and without shoe gear.  This pain limits her daily activities. Pain symptoms resolve with periodic professional debridement.  She voices no new pedal problems on today's visit.  Hoyt Koch, MD is her PCP.    Current Outpatient Medications:  .  ACCU-CHEK AVIVA PLUS test strip, CHECK BLOOD SUGAR TWICE DAILY, Disp: 100 each, Rfl: 2 .  Accu-Chek Softclix Lancets lancets, Use to check blood sugars twice a day, Disp: 100 each, Rfl: 1 .  aspirin 81 MG tablet, Take 81 mg by mouth daily as needed for pain. , Disp: , Rfl:  .  blood glucose meter kit and supplies, Dispense based on patient and insurance preference. Use up to four times daily as directed. (FOR ICD-10 E11.8), Disp: 1 each, Rfl: 0 .  Blood Glucose Monitoring Suppl (ONE TOUCH ULTRA 2) w/Device KIT, Use to check blood sugars twice a day Dx E11.8, Disp: 1 each, Rfl: 0 .  clorazepate (TRANXENE) 7.5 MG tablet, TAKE 1 TABLET(7.5 MG) BY MOUTH DAILY AS NEEDED, Disp: 90 tablet, Rfl: 0 .  diclofenac sodium (VOLTAREN) 1 % GEL, Apply 2 g topically 4 (four) times daily., Disp: 100 g, Rfl: 6 .  metFORMIN (GLUCOPHAGE) 500 MG tablet, TAKE 1 TABLET(500 MG) BY MOUTH TWICE DAILY WITH A MEAL, Disp: 180 tablet, Rfl: 1 .  Multiple Vitamins-Minerals (CENTRUM SILVER PO), Take 1 tablet by mouth daily.  , Disp: , Rfl:  .  triamcinolone cream (KENALOG) 0.1 %, Apply 1 application topically 2 (two) times daily., Disp: 30 g, Rfl: 0   Allergies  Allergen Reactions  . Codeine     REACTION: nausea     Objective:  Physical Examination:  Vascular  Examination: Capillary refill time <3 seconds x 10 digits.  Palpable DP/PT pulses b/l.  Digital hair present b/l.  No edema noted b/l.  Skin temperature gradient WNL b/l.  Dermatological Examination: Skin with normal  turgor, texture and tone b/l.  No open wounds b/l.  No interdigital macerations noted b/l.  Elongated, thick, discolored brittle toenails with subungual debris and pain on dorsal palpation of nailbeds 1-5 b/l.  Hyperkeratotic lesions dorsal PIPJ b/l 4th and right 5th digit with tenderness to palpation. No edema, no erythema, no drainage, no flocculence.  Musculoskeletal Examination: Muscle strength 5/5 to all muscle groups b/l.  Hammertoes b/l 4th and right 5th digit.  No pain, crepitus or joint discomfort with active/passive ROM.  Neurological Examination: Sensation diminished b/l with 10 gram monofilament.  Vibratory sensation intact b/l.  Assessment: 1. Mycotic nail infection with pain 1-5 b/l 2. Corns b/l 4th and right 5th digits 3. NIDDM with neuropathy  Plan: 1. Toenails 1-5 b/l were debrided in length and girth without iatrogenic laceration. 2. Corn(s) pared b/l 4th and right 5th digits utilizing sterile scalpel blade without incident. 3. Continue soft, supportive shoe gear daily. 4. Report any pedal injuries to medical professional. 5. Follow up 3 months. 6. Patient/POA to call should there be a question/concern in there interim.

## 2019-04-06 ENCOUNTER — Ambulatory Visit: Payer: Medicare Other | Admitting: Internal Medicine

## 2019-04-08 ENCOUNTER — Encounter: Payer: Self-pay | Admitting: Internal Medicine

## 2019-04-08 ENCOUNTER — Other Ambulatory Visit: Payer: Self-pay

## 2019-04-08 ENCOUNTER — Ambulatory Visit (INDEPENDENT_AMBULATORY_CARE_PROVIDER_SITE_OTHER): Payer: Medicare Other | Admitting: Internal Medicine

## 2019-04-08 ENCOUNTER — Other Ambulatory Visit (INDEPENDENT_AMBULATORY_CARE_PROVIDER_SITE_OTHER): Payer: Medicare Other

## 2019-04-08 VITALS — BP 130/86 | HR 82 | Temp 98.3°F | Ht 61.0 in | Wt 123.0 lb

## 2019-04-08 DIAGNOSIS — F411 Generalized anxiety disorder: Secondary | ICD-10-CM

## 2019-04-08 DIAGNOSIS — E538 Deficiency of other specified B group vitamins: Secondary | ICD-10-CM

## 2019-04-08 DIAGNOSIS — E118 Type 2 diabetes mellitus with unspecified complications: Secondary | ICD-10-CM

## 2019-04-08 DIAGNOSIS — Z0000 Encounter for general adult medical examination without abnormal findings: Secondary | ICD-10-CM | POA: Diagnosis not present

## 2019-04-08 DIAGNOSIS — Z23 Encounter for immunization: Secondary | ICD-10-CM

## 2019-04-08 DIAGNOSIS — R413 Other amnesia: Secondary | ICD-10-CM | POA: Diagnosis not present

## 2019-04-08 LAB — HEMOGLOBIN A1C: Hgb A1c MFr Bld: 9.5 % — ABNORMAL HIGH (ref 4.6–6.5)

## 2019-04-08 LAB — COMPREHENSIVE METABOLIC PANEL
ALT: 10 U/L (ref 0–35)
AST: 15 U/L (ref 0–37)
Albumin: 4.2 g/dL (ref 3.5–5.2)
Alkaline Phosphatase: 96 U/L (ref 39–117)
BUN: 11 mg/dL (ref 6–23)
CO2: 28 mEq/L (ref 19–32)
Calcium: 10.1 mg/dL (ref 8.4–10.5)
Chloride: 97 mEq/L (ref 96–112)
Creatinine, Ser: 0.73 mg/dL (ref 0.40–1.20)
GFR: 92.01 mL/min (ref >60.00)
Glucose, Bld: 236 mg/dL — ABNORMAL HIGH (ref 70–99)
Potassium: 4 mEq/L (ref 3.5–5.1)
Sodium: 136 mEq/L (ref 135–145)
Total Bilirubin: 0.3 mg/dL (ref 0.2–1.2)
Total Protein: 7.7 g/dL (ref 6.0–8.3)

## 2019-04-08 LAB — VITAMIN B12: Vitamin B-12: 321 pg/mL (ref 211–911)

## 2019-04-08 LAB — CBC
HCT: 40.3 % (ref 36.0–46.0)
Hemoglobin: 13.3 g/dL (ref 12.0–15.0)
MCHC: 33 g/dL (ref 30.0–36.0)
MCV: 92.5 fl (ref 78.0–100.0)
Platelets: 239 10*3/uL (ref 150.0–400.0)
RBC: 4.36 Mil/uL (ref 3.87–5.11)
RDW: 13.7 % (ref 11.5–15.5)
WBC: 6.5 10*3/uL (ref 4.0–10.5)

## 2019-04-08 LAB — TSH: TSH: 0.7 u[IU]/mL (ref 0.35–4.50)

## 2019-04-08 NOTE — Progress Notes (Signed)
Subjective:   Patient ID: Kimberly Aguirre, female    DOB: 08-08-1933, 85 y.o.   MRN: [id number]  HPI Here for medicare wellness, with new complaints. Please see A/P for status and treatment of chronic medical problems.   HPI #2: Here with concerns about memory (her son is with her, some bills unpaid or late, losing things around the house, he is not sure if this is dementia or depression as she is struggling with the isolation from the pandemic and lives alone, they wish to go to neurology), and anxiety (she is a little worse off than normal, denies SI/HI, does not feel her clorazepate is doing anything and wants to try stopping this, denies falls, denies balance problems) and diabetes (denies numbness in feet, denies low sugars, denies missing meds, taking metformin only currently, last HgA1c slightly elevated).   Diet: DM since diabetic Physical activity: sedentary Depression/mood screen: negative Hearing: intact to whispered voice Visual acuity: grossly normal with lens, performs annual eye exam  ADLs: capable Fall risk: none Home safety: good Cognitive evaluation: intact to orientation, naming, recall and repetition EOL planning: adv directives discussed, given information    Office Visit from 01/06/2019 in [place]  PHQ-2 Total Score  1      I have personally reviewed and have noted 1. The patient's medical and social history - reviewed today no changes 2. Their use of alcohol, tobacco or illicit drugs 3. Their current medications and supplements 4. The patient's functional ability including ADL's, fall risks, home safety risks and hearing or visual impairment. 5. Diet and physical activities 6. Evidence for depression or mood disorders 7. Care team reviewed and updated  Patient Care Team: Hoyt Koch, MD as PCP - General (Internal Medicine) Past Medical History:  Diagnosis Date  . Anxiety   . Blindness of left eye   . Chest pain,  atypical   . Dermatitis   . Diabetes mellitus   . DJD (degenerative joint disease)   . Headache(784.0)   . Hiatal hernia   . Hx of colonic polyps   . IBS (irritable bowel syndrome)   . Osteopenia   . Overactive bladder   . Venous insufficiency    Past Surgical History:  Procedure Laterality Date  . EYE SURGERY     age 88  . VAGINAL HYSTERECTOMY     and ureteral sling 06/2010 by Dr. Cristine Polio   Family History  Problem Relation Age of Onset  . Diabetes Mother   . Diabetes Father   . CAD Neg Hx    Review of Systems  Constitutional: Negative.   HENT: Negative.   Eyes: Negative.   Respiratory: Negative for cough, chest tightness and shortness of breath.   Cardiovascular: Negative for chest pain, palpitations and leg swelling.  Gastrointestinal: Negative for abdominal distention, abdominal pain, constipation, diarrhea, nausea and vomiting.  Musculoskeletal: Negative.   Skin: Negative.   Neurological: Negative.        Memory change  Psychiatric/Behavioral: Positive for dysphoric mood. The patient is nervous/anxious.     Objective:  Physical Exam Constitutional:      Appearance: She is well-developed.  HENT:     Head: Normocephalic and atraumatic.  Neck:     Musculoskeletal: Normal range of motion.  Cardiovascular:     Rate and Rhythm: Normal rate and regular rhythm.  Pulmonary:     Effort: Pulmonary effort is normal. No respiratory distress.     Breath sounds: Normal breath sounds. No  wheezing or rales.  Abdominal:     General: Bowel sounds are normal. There is no distension.     Palpations: Abdomen is soft.     Tenderness: There is no abdominal tenderness. There is no rebound.  Skin:    General: Skin is warm and dry.     Comments: Foot exam done  Neurological:     Mental Status: She is alert and oriented to person, place, and time.     Coordination: Coordination normal.     Vitals:   04/08/19 1013  BP: 130/86  Pulse: 82  Temp: 98.3 F (36.8 C)   TempSrc: Oral  SpO2: 97%  Weight: 123 lb (55.8 kg)  Height: 5\' 1"  (1.549 m)   Assessment & Plan:  Flu shot given at visit

## 2019-04-08 NOTE — Patient Instructions (Signed)
Health Maintenance, Female Adopting a healthy lifestyle and getting preventive care are important in promoting health and wellness. Ask your health care provider about:  The right schedule for you to have regular tests and exams.  Things you can do on your own to prevent diseases and keep yourself healthy. What should I know about diet, weight, and exercise? Eat a healthy diet   Eat a diet that includes plenty of vegetables, fruits, low-fat dairy products, and lean protein.  Do not eat a lot of foods that are high in solid fats, added sugars, or sodium. Maintain a healthy weight Body mass index (BMI) is used to identify weight problems. It estimates body fat based on height and weight. Your health care provider can help determine your BMI and help you achieve or maintain a healthy weight. Get regular exercise Get regular exercise. This is one of the most important things you can do for your health. Most adults should:  Exercise for at least 150 minutes each week. The exercise should increase your heart rate and make you sweat (moderate-intensity exercise).  Do strengthening exercises at least twice a week. This is in addition to the moderate-intensity exercise.  Spend less time sitting. Even light physical activity can be beneficial. Watch cholesterol and blood lipids Have your blood tested for lipids and cholesterol at 83 years of age, then have this test every 5 years. Have your cholesterol levels checked more often if:  Your lipid or cholesterol levels are high.  You are older than 83 years of age.  You are at high risk for heart disease. What should I know about cancer screening? Depending on your health history and family history, you may need to have cancer screening at various ages. This may include screening for:  Breast cancer.  Cervical cancer.  Colorectal cancer.  Skin cancer.  Lung cancer. What should I know about heart disease, diabetes, and high blood  pressure? Blood pressure and heart disease  High blood pressure causes heart disease and increases the risk of stroke. This is more likely to develop in people who have high blood pressure readings, are of African descent, or are overweight.  Have your blood pressure checked: ? Every 3-5 years if you are 18-39 years of age. ? Every year if you are 40 years old or older. Diabetes Have regular diabetes screenings. This checks your fasting blood sugar level. Have the screening done:  Once every three years after age 40 if you are at a normal weight and have a low risk for diabetes.  More often and at a younger age if you are overweight or have a high risk for diabetes. What should I know about preventing infection? Hepatitis B If you have a higher risk for hepatitis B, you should be screened for this virus. Talk with your health care provider to find out if you are at risk for hepatitis B infection. Hepatitis C Testing is recommended for:  Everyone born from 1945 through 1965.  Anyone with known risk factors for hepatitis C. Sexually transmitted infections (STIs)  Get screened for STIs, including gonorrhea and chlamydia, if: ? You are sexually active and are younger than 83 years of age. ? You are older than 83 years of age and your health care provider tells you that you are at risk for this type of infection. ? Your sexual activity has changed since you were last screened, and you are at increased risk for chlamydia or gonorrhea. Ask your health care provider if   you are at risk.  Ask your health care provider about whether you are at high risk for HIV. Your health care provider may recommend a prescription medicine to help prevent HIV infection. If you choose to take medicine to prevent HIV, you should first get tested for HIV. You should then be tested every 3 months for as long as you are taking the medicine. Pregnancy  If you are about to stop having your period (premenopausal) and  you may become pregnant, seek counseling before you get pregnant.  Take 400 to 800 micrograms (mcg) of folic acid every day if you become pregnant.  Ask for birth control (contraception) if you want to prevent pregnancy. Osteoporosis and menopause Osteoporosis is a disease in which the bones lose minerals and strength with aging. This can result in bone fractures. If you are 65 years old or older, or if you are at risk for osteoporosis and fractures, ask your health care provider if you should:  Be screened for bone loss.  Take a calcium or vitamin D supplement to lower your risk of fractures.  Be given hormone replacement therapy (HRT) to treat symptoms of menopause. Follow these instructions at home: Lifestyle  Do not use any products that contain nicotine or tobacco, such as cigarettes, e-cigarettes, and chewing tobacco. If you need help quitting, ask your health care provider.  Do not use street drugs.  Do not share needles.  Ask your health care provider for help if you need support or information about quitting drugs. Alcohol use  Do not drink alcohol if: ? Your health care provider tells you not to drink. ? You are pregnant, may be pregnant, or are planning to become pregnant.  If you drink alcohol: ? Limit how much you use to 0-1 drink a day. ? Limit intake if you are breastfeeding.  Be aware of how much alcohol is in your drink. In the [place] one drink equals one 12 oz bottle of beer (355 mL), one 5 oz glass of wine (148 mL), or one 1 oz glass of hard liquor (44 mL). General instructions  Schedule regular health, dental, and eye exams.  Stay current with your vaccines.  Tell your health care provider if: ? You often feel depressed. ? You have ever been abused or do not feel safe at home. Summary  Adopting a healthy lifestyle and getting preventive care are important in promoting health and wellness.  Follow your health care provider's instructions about healthy  diet, exercising, and getting tested or screened for diseases.  Follow your health care provider's instructions on monitoring your cholesterol and blood pressure. This information is not intended to replace advice given to you by your health care provider. Make sure you discuss any questions you have with your health care provider. Document Released: 01/15/2011 Document Revised: 06/25/2018 Document Reviewed: 06/25/2018 Elsevier Patient Education  2020 Elsevier Inc.  

## 2019-04-09 DIAGNOSIS — R413 Other amnesia: Secondary | ICD-10-CM | POA: Insufficient documentation

## 2019-04-09 NOTE — Assessment & Plan Note (Signed)
Checking labs today and referral to neurology. Concerns from son sound concerning for either dementia or anxiety/depression related. She has been struggling with coping with the isolation of the pandemic.

## 2019-04-09 NOTE — Assessment & Plan Note (Signed)
Flu shot given. Pneumonia complete. Shingrix counseled. Tetanus due but declines. Colonoscopy aged out. Mammogram aged out, pap smear aged out and dexa due but declines further. Counseled about sun safety and mole surveillance. Counseled about the dangers of distracted driving. Given 10 year screening recommendations.

## 2019-04-09 NOTE — Assessment & Plan Note (Signed)
She wishes to stop clorazepate which she has been on for years which is fine. She does not want to start anything else at this time and pursue memory evaluation first. If this shows anxiety or depression playing a role she is willing to treat.

## 2019-04-09 NOTE — Assessment & Plan Note (Signed)
Foot exam done, reminded about eye exam. Checking HgA1c today and adjust metformin as needed. Checking microalbumin to creatinine ratio. Adjust as needed.

## 2019-04-15 ENCOUNTER — Encounter: Payer: Self-pay | Admitting: Neurology

## 2019-04-16 ENCOUNTER — Telehealth: Payer: Self-pay

## 2019-04-16 NOTE — Telephone Encounter (Signed)
Copied from [place] [id number]. Topic: General - Other >> Apr 16, 2019 12:33 PM Keene Breath wrote: Reason for CRM: Patient called to talk with the nurse or doctor regarding Dr. Sharlet Salina sending her to a neurologist.  Patient does not know why and has no information.  Please call to discuss at [phone]

## 2019-04-16 NOTE — Telephone Encounter (Signed)
Called patient and she states she does not agree to go and is going to cancel the appointment with them. Does not agree that she has memory issues and will not be bringing her son with her to her appointments.

## 2019-04-16 NOTE — Telephone Encounter (Signed)
We all talked about this during her visit and she was in agreement about this.

## 2019-04-16 NOTE — Telephone Encounter (Signed)
I called patient about labs earlier and she brought this up. I informed her it was a referral that was placed because of memory loss and that it seemed her son was worried and that is why it was placed. Is there any other reason that the referral was placed or just because the son wanted it?

## 2019-04-21 ENCOUNTER — Other Ambulatory Visit: Payer: Self-pay | Admitting: Internal Medicine

## 2019-04-21 MED ORDER — METFORMIN HCL 1000 MG PO TABS: 1000.0000 mg | ORAL_TABLET | Freq: Two times a day (BID) | ORAL | 3 refills | Status: DC

## 2019-04-22 ENCOUNTER — Telehealth: Payer: Self-pay | Admitting: *Deleted

## 2019-04-22 NOTE — Telephone Encounter (Signed)
Copied from [place] [id number] [id number]. Topic: General - Inquiry >> Apr 22, 2019 10:48 AM Rutherford Nail, NT wrote: Reason for CRM: Patient calling and is requesting a call back from Dr Sharlet Salina. States that her metFORMIN (GLUCOPHAGE) 1000 MG tablet that she picked up from the pharmacy is different that her last metformin pills. Explained to her that her prescription has changed since her last refill and is now a 1000mg  tablet instead of a 500mg  tablet, so that could be the difference in the look of the pill. Patient would like to heard this from her provider. Patient will be out of the house until 12:30pm today. Please advise.

## 2019-04-22 NOTE — Telephone Encounter (Signed)
I called pt- see 04/08/19 labs. PCP increased Metformin to 1000 mg bid. Patient was informed of this previously and I reassured her this increase was due to her elevated Hgb A1c.

## 2019-04-24 ENCOUNTER — Other Ambulatory Visit: Payer: Self-pay | Admitting: Internal Medicine

## 2019-04-24 NOTE — Telephone Encounter (Signed)
Medication Refill - Medication: clorazepate (TRANXENE) 7.5 MG tablet   Preferred Pharmacy (with phone number or street name):  [facility] [phone] - [address], [address]  [place] [address]  Phone: [phone] Fax: [phone]  Not a 24 hour pharmacy; exact hours not known     Agent: Please be advised that RX refills may take up to 3 business days. We ask that you follow-up with your pharmacy.

## 2019-04-24 NOTE — Telephone Encounter (Signed)
Please advise per dr. Nathanial Millman absence. Thank you   Control database checked last refill: 03/18/2019 90 tabs  LOV: 04/08/2019 [id number]

## 2019-04-24 NOTE — Telephone Encounter (Signed)
Please clarify- she got 90 tablets in early September for medication that is prescribed daily. Should not need a refill until December.

## 2019-04-24 NOTE — Telephone Encounter (Signed)
Requested medication (s) are due for refill today: yes  Requested medication (s) are on the active medication list: yes  Last refill:  12/22/2018  Future visit scheduled: no  Notes to clinic:  Review for refill   Requested Prescriptions  Pending Prescriptions Disp Refills   clorazepate (TRANXENE) 7.5 MG tablet 90 tablet 0    Sig: TAKE 1 TABLET(7.5 MG) BY MOUTH DAILY AS NEEDED     Not Delegated - Psychiatry:  Anxiolytics/Hypnotics Failed - 04/24/2019 11:41 AM      Failed - This refill cannot be delegated      Failed - Urine Drug Screen completed in last 360 days.      Passed - Valid encounter within last 6 months    Recent Outpatient Visits          2 weeks ago Routine general medical examination at a health care facility   [place], Elizabeth A, MD   3 months ago Diabetes mellitus with complication [facility])   [place], [place], MD   7 months ago Diabetes mellitus with complication [facility])   [facility] -Chuck Hint, MD   1 year ago Routine general medical examination at a health care facility   [place], Elizabeth A, MD   1 year ago Diabetes mellitus with complication [facility])   [facility] -Chuck Hint, MD

## 2019-04-27 ENCOUNTER — Telehealth: Payer: Self-pay

## 2019-04-27 NOTE — Telephone Encounter (Signed)
Can you call her pharmacy and see when they gave her meds last. PDMP states filled early September so should not need until December.

## 2019-04-27 NOTE — Telephone Encounter (Signed)
Called patient and states that she never picked it up that she picked up metformin and that [facility] did not even put it in a bag for her just handed her the bottle. States that she was worried about the metformin because the shape changed, but states she never picked up the clorazepate and she is not someone to mess with medications like that. I told her I would inform you but that it probably not be refilled till December. Patient is very worried about that she checked all her medications and did not have any bottle there with clorazepate on it. States she is very overwhelmed

## 2019-04-27 NOTE — Telephone Encounter (Signed)
fyi Called patient back to inform of MD response. Patient repeats herself many times and when I repeat myself it is like saying it for the first time. I informed her many times to call the pharmacy and speak to the pharmacist about her concerns, patient still insistent that she has not picked up the medication on the 3rd. I had repeated many times that her last visit with [place] was on the 23rd of September and patient states she has not picked up any medication since then. When I repeat that the clorazepate was fill prior to that appointment patient agrees but states in June which is correct but we are showing that she picked up a more recent script and that we cannot do anything about it as that is what it shows in the system. Patient still seemed confused when I got off the phone with her.

## 2019-04-27 NOTE — Telephone Encounter (Signed)
Pt called stating she found the medication that she was looking for. Please advise.

## 2019-04-27 NOTE — Telephone Encounter (Signed)
Already had a long conversation with patient in regards to her clorazepate there was no return call to be made. We cannot refill the medication till December as that is when it is due. Patient was informed of this in last phone conversation

## 2019-04-27 NOTE — Telephone Encounter (Signed)
Noted no need to advise on this

## 2019-04-27 NOTE — Telephone Encounter (Signed)
Can you look over me on this request. pmp says refill early September 90 tabs, but Patient stated that she did not get her medication in September but was told that her refill request was too early? Just want to make sure I am seeing things correctly.

## 2019-04-27 NOTE — Telephone Encounter (Signed)
Picked up on 3rd of September 90 tabs

## 2019-04-27 NOTE — Telephone Encounter (Signed)
She needs to contact pharmacy directly and share concerns and see what they can do to remedy situation. We have to go by when it was filled by pharmacy and not due until December

## 2019-04-27 NOTE — Telephone Encounter (Signed)
Copied from [place] [phone]. Topic: Quick Communication - Office Called Patient (Clinic Use ONLY) >> Apr 27, 2019 11:50 AM Lennox Solders wrote: Reason for CRM:pt is returning [place] call

## 2019-06-17 ENCOUNTER — Telehealth: Payer: Self-pay | Admitting: *Deleted

## 2019-06-17 NOTE — Telephone Encounter (Signed)
Copied from [place] [id number]. Topic: General - Other >> Jun 17, 2019  2:09 PM Antonieta Iba C wrote: Reason for CRM: pt called in requesting a call back from [place] to discuss current medication. I couldn't really make out what pt is asking, she is concerned about Rx numbers on the medication bottles. Pt asked if she should still see Dr. Delice Lesch?    CB: [phone]

## 2019-06-17 NOTE — Telephone Encounter (Signed)
I called pt- she needed clarification regarding the neurology referral.  I informed her that the referral came about from her 04/08/19 office visit. It is documented that there was memory concerns. She states she will keep the appointment.

## 2019-06-18 ENCOUNTER — Other Ambulatory Visit: Payer: Self-pay | Admitting: Internal Medicine

## 2019-06-18 NOTE — Telephone Encounter (Signed)
Control database checked last refill: 03/18/2019 90 tabs LOV: 04/08/2019 [id number]

## 2019-06-18 NOTE — Telephone Encounter (Signed)
Done erx 

## 2019-06-24 ENCOUNTER — Encounter: Payer: Self-pay | Admitting: Neurology

## 2019-06-24 ENCOUNTER — Other Ambulatory Visit: Payer: Self-pay

## 2019-06-24 ENCOUNTER — Ambulatory Visit (INDEPENDENT_AMBULATORY_CARE_PROVIDER_SITE_OTHER): Payer: Medicare Other | Admitting: Neurology

## 2019-06-24 VITALS — BP 129/68 | HR 94 | Ht 60.0 in | Wt 117.0 lb

## 2019-06-24 DIAGNOSIS — R413 Other amnesia: Secondary | ICD-10-CM

## 2019-06-24 DIAGNOSIS — F0390 Unspecified dementia without behavioral disturbance: Secondary | ICD-10-CM

## 2019-06-24 DIAGNOSIS — F03A0 Unspecified dementia, mild, without behavioral disturbance, psychotic disturbance, mood disturbance, and anxiety: Secondary | ICD-10-CM

## 2019-06-24 MED ORDER — DONEPEZIL HCL 10 MG PO TABS: ORAL_TABLET | ORAL | 11 refills | Status: DC

## 2019-06-24 NOTE — Patient Instructions (Addendum)
1. Schedule MRI brain without contrast  2. Schedule Neurocognitive evaluation for the memory testing  3. Start Donepezil 10mg : take 1/2 tablet daily for 2 weeks, then increase to 1 tablet daily  4. Follow-up in 6 months, call for any changes   RECOMMENDATIONS FOR ALL PATIENTS WITH MEMORY PROBLEMS: 1. Continue to exercise (Recommend 30 minutes of walking everyday, or 3 hours every week) 2. Increase social interactions - continue going to [place] and enjoy social gatherings with friends and family 3. Eat healthy, avoid fried foods and eat more fruits and vegetables 4. Maintain adequate blood pressure, blood sugar, and blood cholesterol level. Reducing the risk of stroke and cardiovascular disease also helps promoting better memory. 5. Avoid stressful situations. Live a simple life and avoid aggravations. Organize your time and prepare for the next day in anticipation. 6. Sleep well, avoid any interruptions of sleep and avoid any distractions in the bedroom that may interfere with adequate sleep quality 7. Avoid sugar, avoid sweets as there is a strong link between excessive sugar intake, diabetes, and cognitive impairment The Mediterranean diet has been shown to help patients reduce the risk of progressive memory disorders and reduces cardiovascular risk. This includes eating fish, eat fruits and green leafy vegetables, nuts like almonds and hazelnuts, walnuts, and also use olive oil. Avoid fast foods and fried foods as much as possible. Avoid sweets and sugar as sugar use has been linked to worsening of memory function.  There is always a concern of gradual progression of memory problems. If this is the case, then we may need to adjust level of care according to patient needs. Support, both to the patient and caregiver, should then be put into place.
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
## 2019-06-30 ENCOUNTER — Ambulatory Visit: Payer: Medicare Other | Admitting: Podiatry

## 2019-07-14 ENCOUNTER — Telehealth: Payer: Self-pay | Admitting: Neurology

## 2019-07-14 ENCOUNTER — Telehealth: Payer: Self-pay | Admitting: Internal Medicine

## 2019-07-14 NOTE — Telephone Encounter (Signed)
Patient is calling in about donepezil medication 10mg  that she just started- she is really forgetful with the medication and she does not like it. She is wanting to speak with the nurse. Thanks!

## 2019-07-14 NOTE — Telephone Encounter (Signed)
Pt has been informed to follow up with her neurology MD to inquire about the recent referral that was entered on 06/24/19. She expressed understanding.

## 2019-07-14 NOTE — Telephone Encounter (Signed)
Pt says that she was referred to see Cameron Sprang, MD and is not sure of why? Pt would like to discuss further.

## 2019-07-15 ENCOUNTER — Other Ambulatory Visit: Payer: Self-pay

## 2019-07-15 ENCOUNTER — Ambulatory Visit
Admission: RE | Admit: 2019-07-15 | Discharge: 2019-07-15 | Disposition: A | Discharge status: Home / Self Care | Payer: Medicare Other | Source: Ambulatory Visit | Attending: Neurology | Admitting: Neurology

## 2019-07-15 DIAGNOSIS — R413 Other amnesia: Secondary | ICD-10-CM

## 2019-07-15 NOTE — Telephone Encounter (Signed)
Pt confirmed that she is up to one whole tablet daily. She states that she feels worse then before she started on the medication. Pt keeps forgetting everything. Can lay something down and not remember where she put it. She does not want to continue taking the medication.  Daughter, Pamala Hurry would also like to be notified of Aquino's response [phone]. She is on the DPR Pt has MRI this evening- FYI

## 2019-07-15 NOTE — Telephone Encounter (Signed)
Please inform patient that increased forgetfulness is likely due to memory loss and less likely medication side effect.  The overall benefit of the medication is low and if she wants to hold it for sometime and monitor, that would be okay.  If she has additional questions, please let them know that Dr. Delice Lesch is out of the office today and will return tomorrow.

## 2019-07-16 ENCOUNTER — Telehealth: Payer: Self-pay | Admitting: Neurology

## 2019-07-16 NOTE — Telephone Encounter (Signed)
-----   Message from Cameron Sprang, MD sent at 07/16/2019 10:45 AM EST ----- Pls let patient/family know that the MRI brain did not show any evidence of tumor, stroke, or bleed. It showed age-related changes and some hardening of the small blood vessels in the brain seen in patients with BP, cholesterol, and diabetes issues. Very important to control these conditions. Thanks

## 2019-07-16 NOTE — Telephone Encounter (Signed)
Son informed and understood. He will try and set pt up for mychart. He would like to see the detailed report of the MRI.

## 2019-07-16 NOTE — Telephone Encounter (Signed)
Please call son when the MRI results come back. Thanks!

## 2019-07-16 NOTE — Telephone Encounter (Signed)
Pt informed and will d/c the donepezil.

## 2019-07-16 NOTE — Telephone Encounter (Signed)
Agree with Dr. Posey Pronto. She can stop medication and see how she does, thanks

## 2019-07-27 ENCOUNTER — Other Ambulatory Visit: Payer: Self-pay

## 2019-07-27 NOTE — Telephone Encounter (Signed)
Copied from [place] [id number]. Topic: General - Other >> Jul 27, 2019  1:27 PM Leward Quan A wrote: Reason for CRM: Patient called to ask Dr Sharlet Salina to please send an Rx to the pharmacy for a new ACCU-CHEK AVIVA glucometer states that she just received her test strips but the meter is not working. Asking for a call back states that she have not checked her sugar in about 3 days. Please send Rx today please. [facility] [phone] - [address], [address]  Phone:  [phone] Fax:  [phone]  Ph# [phone]

## 2019-07-28 MED ORDER — BLOOD GLUCOSE METER KIT: PACK | 0 refills | Status: AC

## 2019-07-28 NOTE — Telephone Encounter (Signed)
I would recommend for her to call the company to see if they can help her fix the machine or change the batteries if needed. Can send in new meter but depending on when she got that one it will likely not be covered by insurance.

## 2019-07-28 NOTE — Addendum Note (Signed)
Addended by: Juliet Rude on: 07/28/2019 08:05 AM   Modules accepted: Orders

## 2019-08-21 ENCOUNTER — Telehealth: Payer: Self-pay | Admitting: Neurology

## 2019-08-24 ENCOUNTER — Telehealth: Payer: Self-pay | Admitting: Psychology

## 2019-08-26 ENCOUNTER — Ambulatory Visit (INDEPENDENT_AMBULATORY_CARE_PROVIDER_SITE_OTHER): Payer: Medicare Other | Admitting: Psychology

## 2019-08-26 ENCOUNTER — Encounter: Payer: Self-pay | Admitting: Psychology

## 2019-08-26 ENCOUNTER — Ambulatory Visit: Payer: Medicare Other | Admitting: Psychology

## 2019-08-26 ENCOUNTER — Encounter: Payer: Medicare Other | Admitting: Psychology

## 2019-08-26 ENCOUNTER — Other Ambulatory Visit: Payer: Self-pay

## 2019-08-26 DIAGNOSIS — F0150 Vascular dementia without behavioral disturbance: Secondary | ICD-10-CM

## 2019-08-26 DIAGNOSIS — R413 Other amnesia: Secondary | ICD-10-CM

## 2019-08-26 DIAGNOSIS — F0390 Unspecified dementia without behavioral disturbance: Secondary | ICD-10-CM

## 2019-08-26 HISTORY — DX: Unspecified dementia, unspecified severity, without behavioral disturbance, psychotic disturbance, mood disturbance, and anxiety: F03.90

## 2019-08-26 NOTE — Progress Notes (Addendum)
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 neuropsychologist. Ms. Wittner completed a comprehensive clinical interview with Dr. , billed as one unit 90791, and 145 minutes of cognitive testing and scoring, billed as one unit 96138 and four additional units 96139. Psychometrist Dana Chamberlain, B.S., assisted Dr.  with test administration and scoring procedures. As a separate and discrete service, Dr.  spent a total of 180 minutes in interpretation and report writing billed as one unit 96132 and two units 96133.     

## 2019-08-26 NOTE — Progress Notes (Signed)
   Psychometrician Note   Cognitive testing was administered to Kimberly Aguirre by technician Milana Kidney, B.S. under the supervision of Dr. Christia Reading, Ph.D., licensed psychologist. Kimberly Aguirre did not appear overtly distressed by the testing session, per behavioral observation or via self-report to the technician. Rest breaks were offered.    In considering the patient's current level of functioning, level of presumed impairment, nature of symptoms, emotional and behavioral responses during the interview, level of literacy, and observed level of motivation/effort, a battery of tests was selected and communicated to the psychometrician.   Communication between the psychologist and technician was ongoing throughout the testing session and changes were made as deemed necessary based on patient performance on testing, technician observations and additional pertinent factors such as those listed above.   Kimberly Aguirre will return within approximately two weeks for an interactive feedback session with Dr. Melvyn Novas at which time her test performances, clinical impressions, and treatment recommendations will be reviewed in detail. The patient understands she can contact our office should she require our assistance before this time.  115 minutes were spent face-to-face with Kimberly Aguirre administering standardized tests. An additional 30 minutes were spent scoring by the technician. [CPT [id number], [id number]  This note reflects time spent with the psychometrician and does not include test scores or any clinical interpretations made by Dr. Melvyn Novas. The full report will follow in a separate note.

## 2019-08-27 ENCOUNTER — Encounter: Payer: Self-pay | Admitting: Internal Medicine

## 2019-08-27 ENCOUNTER — Ambulatory Visit (INDEPENDENT_AMBULATORY_CARE_PROVIDER_SITE_OTHER): Payer: Medicare Other | Admitting: Internal Medicine

## 2019-08-27 VITALS — BP 118/70 | HR 86 | Temp 98.2°F | Ht 60.0 in | Wt 114.0 lb

## 2019-08-27 DIAGNOSIS — I10 Essential (primary) hypertension: Secondary | ICD-10-CM

## 2019-08-27 DIAGNOSIS — E118 Type 2 diabetes mellitus with unspecified complications: Secondary | ICD-10-CM

## 2019-08-27 DIAGNOSIS — F0150 Vascular dementia without behavioral disturbance: Secondary | ICD-10-CM

## 2019-08-27 DIAGNOSIS — F0390 Unspecified dementia without behavioral disturbance: Secondary | ICD-10-CM

## 2019-08-27 LAB — COMPREHENSIVE METABOLIC PANEL
ALT: 10 U/L (ref 0–35)
AST: 14 U/L (ref 0–37)
Albumin: 4.3 g/dL (ref 3.5–5.2)
Alkaline Phosphatase: 68 U/L (ref 39–117)
BUN: 11 mg/dL (ref 6–23)
CO2: 32 mEq/L (ref 19–32)
Calcium: 10 mg/dL (ref 8.4–10.5)
Chloride: 100 mEq/L (ref 96–112)
Creatinine, Ser: 0.71 mg/dL (ref 0.40–1.20)
GFR: 94.92 mL/min (ref >60.00)
Glucose, Bld: 163 mg/dL — ABNORMAL HIGH (ref 70–99)
Potassium: 4 mEq/L (ref 3.5–5.1)
Sodium: 139 mEq/L (ref 135–145)
Total Bilirubin: 0.4 mg/dL (ref 0.2–1.2)
Total Protein: 7.9 g/dL (ref 6.0–8.3)

## 2019-08-27 LAB — HEMOGLOBIN A1C: Hgb A1c MFr Bld: 7.8 % — ABNORMAL HIGH (ref 4.6–6.5)

## 2019-08-27 LAB — CBC
HCT: 41.5 % (ref 36.0–46.0)
Hemoglobin: 13.5 g/dL (ref 12.0–15.0)
MCHC: 32.5 g/dL (ref 30.0–36.0)
MCV: 93 fl (ref 78.0–100.0)
Platelets: 274 10*3/uL (ref 150.0–400.0)
RBC: 4.46 Mil/uL (ref 3.87–5.11)
RDW: 13.8 % (ref 11.5–15.5)
WBC: 4.6 10*3/uL (ref 4.0–10.5)

## 2019-08-27 NOTE — Assessment & Plan Note (Signed)
Discussed results of testing including MRI. Reviewed neuropsych evaluation with patient and daughter-in-law. She feels that aricept caused her worsening memory problems which is not likely but given mild on neuropsych eval fine with no meds for now.

## 2019-08-27 NOTE — Progress Notes (Signed)
   Subjective:   Patient ID: Kimberly Aguirre, female    DOB: 12/31/1933, 86 y.o.   MRN: [id number]  HPI The patient is an 86 YO female coming in for concerns about memory (concerned about MRI results from neurology and assessment yesterday, was put on aricept which patient felt made her memory worse, she stopped taking that within 2 weeks of starting, does feel she is more forgetful than before she tried that medicine and feels that is the cause) and diabetes (taking metformin, checking sugars in the morning, running stable from before, denies excessive thirst or urination) and blood pressure (at goal off meds, denies chest pains or headaches).   Review of Systems  Constitutional: Negative.   HENT: Negative.   Eyes: Negative.   Respiratory: Negative for cough, chest tightness and shortness of breath.   Cardiovascular: Negative for chest pain, palpitations and leg swelling.  Gastrointestinal: Negative for abdominal distention, abdominal pain, constipation, diarrhea, nausea and vomiting.  Musculoskeletal: Negative.   Skin: Negative.   Neurological: Negative.        Memory changes  Psychiatric/Behavioral: Negative.     Objective:  Physical Exam Constitutional:      Appearance: She is well-developed.  HENT:     Head: Normocephalic and atraumatic.  Cardiovascular:     Rate and Rhythm: Normal rate and regular rhythm.  Pulmonary:     Effort: Pulmonary effort is normal. No respiratory distress.     Breath sounds: Normal breath sounds. No wheezing or rales.  Abdominal:     General: Bowel sounds are normal. There is no distension.     Palpations: Abdomen is soft.     Tenderness: There is no abdominal tenderness. There is no rebound.  Musculoskeletal:     Cervical back: Normal range of motion.  Skin:    General: Skin is warm and dry.  Neurological:     Mental Status: She is alert and oriented to person, place, and time.     Coordination: Coordination normal.     Vitals:   08/27/19  1000  BP: 118/70  Pulse: 86  Temp: 98.2 F (36.8 C)  TempSrc: Oral  SpO2: 96%  Weight: 114 lb (51.7 kg)  Height: 5' (1.524 m)    This visit occurred during the SARS-CoV-2 public health emergency.  Safety protocols were in place, including screening questions prior to the visit, additional usage of staff PPE, and extensive cleaning of exam room while observing appropriate contact time as indicated for disinfecting solutions.   Assessment & Plan:

## 2019-08-27 NOTE — Assessment & Plan Note (Signed)
BP at goal off meds. If needed meds would recommend ACE-I/ARB with concurrent diabetes.

## 2019-08-27 NOTE — Patient Instructions (Signed)
Diabetes Mellitus and Standards of Medical Care Managing diabetes (diabetes mellitus) can be complicated. Your diabetes treatment may be managed by a team of health care providers, including:  A physician who specializes in diabetes (endocrinologist).  A nurse practitioner or physician assistant.  Nurses.  A diet and nutrition specialist (registered dietitian).  A certified diabetes educator (CDE).  An exercise specialist.  A pharmacist.  An eye doctor.  A foot specialist (podiatrist).  A dentist.  A primary care provider.  A mental health provider. Your health care providers follow guidelines to help you get the best quality of care. The following schedule is a general guideline for your diabetes management plan. Your health care providers may give you more specific instructions. Physical exams Upon being diagnosed with diabetes mellitus, and each year after that, your health care provider will ask about your medical and family history. He or she will also do a physical exam. Your exam may include:  Measuring your height, weight, and body mass index (BMI).  Checking your blood pressure. This will be done at every routine medical visit. Your target blood pressure may vary depending on your medical conditions, your age, and other factors.  Thyroid gland exam.  Skin exam.  Screening for damage to your nerves (peripheral neuropathy). This may include checking the pulse in your legs and feet and checking the level of sensation in your hands and feet.  A complete foot exam to inspect the structure and skin of your feet, including checking for cuts, bruises, redness, blisters, sores, or other problems.  Screening for blood vessel (vascular) problems, which may include checking the pulse in your legs and feet and checking your temperature. Blood tests Depending on your treatment plan and your personal needs, you may have the following tests done:  HbA1c (hemoglobin A1c). This  test provides information about blood sugar (glucose) control over the previous 2-3 months. It is used to adjust your treatment plan, if needed. This test will be done: ? At least 2 times a year, if you are meeting your treatment goals. ? 4 times a year, if you are not meeting your treatment goals or if treatment goals have changed.  Lipid testing, including total, LDL, and HDL cholesterol and triglyceride levels. ? The goal for LDL is less than 100 mg/dL (5.5 mmol/L). If you are at high risk for complications, the goal is less than 70 mg/dL (3.9 mmol/L). ? The goal for HDL is 40 mg/dL (2.2 mmol/L) or higher for men and 50 mg/dL (2.8 mmol/L) or higher for women. An HDL cholesterol of 60 mg/dL (3.3 mmol/L) or higher gives some protection against heart disease. ? The goal for triglycerides is less than 150 mg/dL (8.3 mmol/L).  Liver function tests.  Kidney function tests.  Thyroid function tests. Dental and eye exams  Visit your dentist two times a year.  If you have type 1 diabetes, your health care provider may recommend an eye exam 3-5 years after you are diagnosed, and then once a year after your first exam. ? For children with type 1 diabetes, a health care provider may recommend an eye exam when your child is age 10 or older and has had diabetes for 3-5 years. After the first exam, your child should get an eye exam once a year.  If you have type 2 diabetes, your health care provider may recommend an eye exam as soon as you are diagnosed, and then once a year after your first exam. Immunizations   The  yearly flu (influenza) vaccine is recommended for everyone 6 months or older who has diabetes.  The pneumonia (pneumococcal) vaccine is recommended for everyone 2 years or older who has diabetes. If you are 65 or older, you may get the pneumonia vaccine as a series of two separate shots.  The hepatitis B vaccine is recommended for adults shortly after being diagnosed with  diabetes.  Adults and children with diabetes should receive all other vaccines according to age-specific recommendations from the Centers for Disease Control and Prevention (CDC). Mental and emotional health Screening for symptoms of eating disorders, anxiety, and depression is recommended at the time of diagnosis and afterward as needed. If your screening shows that you have symptoms (positive screening result), you may need more evaluation and you may work with a mental health care provider. Treatment plan Your treatment plan will be reviewed at every medical visit. You and your health care provider will discuss:  How you are taking your medicines, including insulin.  Any side effects you are experiencing.  Your blood glucose target goals.  The frequency of your blood glucose monitoring.  Lifestyle habits, such as activity level as well as tobacco, alcohol, and substance use. Diabetes self-management education Your health care provider will assess how well you are monitoring your blood glucose levels and whether you are taking your insulin correctly. He or she may refer you to:  A certified diabetes educator to manage your diabetes throughout your life, starting at diagnosis.  A registered dietitian who can create or review your personal nutrition plan.  An exercise specialist who can discuss your activity level and exercise plan. Summary  Managing diabetes (diabetes mellitus) can be complicated. Your diabetes treatment may be managed by a team of health care providers.  Your health care providers follow guidelines in order to help you get the best quality of care.  Standards of care including having regular physical exams, blood tests, blood pressure monitoring, immunizations, screening tests, and education about how to manage your diabetes.  Your health care providers may also give you more specific instructions based on your individual health. This information is not intended  to replace advice given to you by your health care provider. Make sure you discuss any questions you have with your health care provider. Document Revised: 03/21/2018 Document Reviewed: 03/30/2016 Elsevier Patient Education  2020 Elsevier Inc.  

## 2019-08-27 NOTE — Assessment & Plan Note (Signed)
Checking HgA1c today, adjust as needed. Taking metformin 1000 mg BID. Complicated by hyperlipidemia.

## 2019-08-28 ENCOUNTER — Other Ambulatory Visit: Payer: Self-pay | Admitting: Internal Medicine

## 2019-08-28 NOTE — Telephone Encounter (Signed)
Done erx 

## 2019-09-11 DIAGNOSIS — M2041 Other hammer toe(s) (acquired), right foot: Secondary | ICD-10-CM | POA: Diagnosis not present

## 2019-09-11 DIAGNOSIS — M2011 Hallux valgus (acquired), right foot: Secondary | ICD-10-CM | POA: Diagnosis not present

## 2019-09-11 DIAGNOSIS — M2012 Hallux valgus (acquired), left foot: Secondary | ICD-10-CM | POA: Diagnosis not present

## 2019-09-11 DIAGNOSIS — M2042 Other hammer toe(s) (acquired), left foot: Secondary | ICD-10-CM | POA: Diagnosis not present

## 2019-09-11 DIAGNOSIS — B351 Tinea unguium: Secondary | ICD-10-CM | POA: Diagnosis not present

## 2019-09-11 DIAGNOSIS — E1141 Type 2 diabetes mellitus with diabetic mononeuropathy: Secondary | ICD-10-CM | POA: Diagnosis not present

## 2019-09-14 ENCOUNTER — Telehealth (INDEPENDENT_AMBULATORY_CARE_PROVIDER_SITE_OTHER): Payer: Medicare Other | Admitting: Psychology

## 2019-09-14 ENCOUNTER — Encounter: Payer: Medicare Other | Admitting: Psychology

## 2019-09-14 ENCOUNTER — Other Ambulatory Visit: Payer: Self-pay

## 2019-09-14 DIAGNOSIS — F0150 Vascular dementia without behavioral disturbance: Secondary | ICD-10-CM

## 2019-09-14 DIAGNOSIS — F0390 Unspecified dementia without behavioral disturbance: Secondary | ICD-10-CM

## 2019-09-14 NOTE — Progress Notes (Signed)
   Neuropsychology Feedback Session Kimberly Aguirre. [facility] Department of Neurology  Reason for Referral:   Kimberly Aguirre a 86 y.o. African-American female referred by Kimberly Aguirre, M.D.,to characterize hercurrent cognitive functioning and assist with diagnostic clarity and treatment planning in the context of subjective cognitive decline.  Feedback:   Kimberly Aguirre completed a comprehensive neuropsychological evaluation on 08/26/2019. Please refer to that encounter for the full report and recommendations. Briefly, results suggested prominent difficulties with executive functioning and all aspects of learning and memory. Additional weaknesses and/or performance variability was noted across processing speed and both receptive language and expressive language. There are several etiologies which warrant consideration. Deficits in executive functioning and encoding/retrieval aspects of memory, coupled with additional weaknesses described above are consistent with a primary vascular etiology. 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 Her medical history also includes several vascular-related conditions, including essential hypertension, hyperlipidemia, and diabetes. Taken together, there is a strong likelihood that deficits are at least partially attributable to her cardiovascular and cerebrovascular health. However, Kimberly Aguirre was fully amnestic across all three administered memory measures and did not show much improvement when provided with cueing or yes/no recognition options, suggesting a deficit in memory storage. This, coupled with evidence for executive dysfunction and poor performance across confrontation naming tasks, is also concerning for Alzheimer's disease. Visuospatial functioning seemed relatively spared at the present time, which could mean that the latter disease  process is still in earlier stages if present. These etiologies can occur simultaneously, representing a "mixed dementia" clinical presentation.  Kimberly Aguirre was accompanied by her son Kimberly Aguirre and daughter Kimberly Aguirre during the current virtual feedback session. Content of the current session focused on the results of her neuropsychological evaluation. Kimberly Aguirre and her family were given the opportunity to ask questions and their questions were answered. They were encouraged to reach out should additional questions arise. A copy of her report was mailed at the conclusion of the visit.      25 minutes were spent conducting the current feedback session with Kimberly Aguirre, billed as one unit [phone]

## 2019-09-15 ENCOUNTER — Ambulatory Visit: Payer: Medicare Other | Admitting: Podiatry

## 2019-09-19 ENCOUNTER — Other Ambulatory Visit: Payer: Self-pay | Admitting: Internal Medicine

## 2019-09-21 ENCOUNTER — Other Ambulatory Visit: Payer: Self-pay | Admitting: Internal Medicine

## 2019-09-21 MED ORDER — ACCU-CHEK AVIVA PLUS VI STRP: ORAL_STRIP | 5 refills | Status: DC

## 2019-09-21 NOTE — Telephone Encounter (Signed)
New Message:  Pt's daughter is calling and states all of the patients medications needs to be transferred to the [facility] in [place] that is under her preferred  Pharmacies. Pt daughter states she needs more test strips due to her leaving them in [place] and they are still moving her. Daughter ask that you call her when the order for the strips have been sent to the pharmacy

## 2019-09-24 MED ORDER — CLORAZEPATE DIPOTASSIUM 7.5 MG PO TABS: ORAL_TABLET | ORAL | 0 refills | Status: DC

## 2019-09-24 NOTE — Addendum Note (Signed)
Addended by: Pricilla Holm A on: 09/24/2019 10:46 AM   Modules accepted: Orders

## 2019-09-24 NOTE — Addendum Note (Signed)
Addended by: Juliet Rude on: 09/24/2019 10:10 AM   Modules accepted: Orders

## 2019-09-24 NOTE — Telephone Encounter (Signed)
    1.Medication Requested:clorazepate (TRANXENE) 7.5 MG tablet  2. Pharmacy (Name, Street, [place]): [place], [place] , phone [phone]  3. On Med List: yes  4. Last Visit with PCP: 08/27/19  5. Next visit date with PCP: n/a , patient is currently living in [place], looking for new PCP   Agent: Please be advised that RX refills may take up to 3 business days. We ask that you follow-up with your pharmacy.

## 2019-09-26 DIAGNOSIS — Z23 Encounter for immunization: Secondary | ICD-10-CM | POA: Diagnosis not present

## 2019-11-27 DIAGNOSIS — H9190 Unspecified hearing loss, unspecified ear: Secondary | ICD-10-CM | POA: Diagnosis not present

## 2019-11-27 DIAGNOSIS — F419 Anxiety disorder, unspecified: Secondary | ICD-10-CM | POA: Diagnosis not present

## 2019-11-27 DIAGNOSIS — G309 Alzheimer's disease, unspecified: Secondary | ICD-10-CM | POA: Diagnosis not present

## 2019-11-27 DIAGNOSIS — E119 Type 2 diabetes mellitus without complications: Secondary | ICD-10-CM | POA: Diagnosis not present

## 2019-11-27 DIAGNOSIS — M549 Dorsalgia, unspecified: Secondary | ICD-10-CM | POA: Diagnosis not present

## 2019-11-27 DIAGNOSIS — Z23 Encounter for immunization: Secondary | ICD-10-CM | POA: Diagnosis not present

## 2019-12-04 DIAGNOSIS — M2011 Hallux valgus (acquired), right foot: Secondary | ICD-10-CM | POA: Diagnosis not present

## 2019-12-04 DIAGNOSIS — E1141 Type 2 diabetes mellitus with diabetic mononeuropathy: Secondary | ICD-10-CM | POA: Diagnosis not present

## 2019-12-04 DIAGNOSIS — M2042 Other hammer toe(s) (acquired), left foot: Secondary | ICD-10-CM | POA: Diagnosis not present

## 2019-12-04 DIAGNOSIS — B351 Tinea unguium: Secondary | ICD-10-CM | POA: Diagnosis not present

## 2019-12-04 DIAGNOSIS — M2041 Other hammer toe(s) (acquired), right foot: Secondary | ICD-10-CM | POA: Diagnosis not present

## 2019-12-04 DIAGNOSIS — M2012 Hallux valgus (acquired), left foot: Secondary | ICD-10-CM | POA: Diagnosis not present

## 2019-12-16 ENCOUNTER — Telehealth: Payer: Self-pay

## 2019-12-16 NOTE — Telephone Encounter (Signed)
1.Medication Requested:  metFORMIN (GLUCOPHAGE) 1000 MG tablet  clorazepate (TRANXENE) 7.5 MG tablet  2. Pharmacy (Name, Street, [place]): [facility], [place]  3. On Med List: Yes   4. Last Visit with PCP: 2.11.2021   5. Next visit date with PCP: n/a   Agent: Please be advised that RX refills may take up to 3 business days. We ask that you follow-up with your pharmacy.

## 2019-12-17 ENCOUNTER — Other Ambulatory Visit: Payer: Self-pay

## 2019-12-17 MED ORDER — METFORMIN HCL 1000 MG PO TABS: 1000.0000 mg | ORAL_TABLET | Freq: Two times a day (BID) | ORAL | 3 refills | Status: DC

## 2019-12-17 MED ORDER — CLORAZEPATE DIPOTASSIUM 7.5 MG PO TABS: ORAL_TABLET | ORAL | 0 refills | Status: DC

## 2019-12-17 MED ORDER — CLORAZEPATE DIPOTASSIUM 7.5 MG PO TABS: ORAL_TABLET | ORAL | 1 refills | Status: AC

## 2019-12-17 NOTE — Addendum Note (Signed)
Addended by: Pricilla Holm A on: 12/17/2019 11:17 AM   Modules accepted: Orders

## 2019-12-17 NOTE — Telephone Encounter (Signed)
LVM saying medications has been sent to pharmacy

## 2019-12-17 NOTE — Telephone Encounter (Signed)
Sent in clorazepate.

## 2020-02-11 ENCOUNTER — Telehealth: Payer: Self-pay | Admitting: Neurology

## 2020-02-11 NOTE — Telephone Encounter (Signed)
Patient has moved down to [place] to live with her daughter Kimberly Aguirre. She would like to get a referral to a neurologist in [place], [place]

## 2020-02-11 NOTE — Telephone Encounter (Signed)
Spoke with pt daughter they will find a MD in [place] and send [place] the information so we can send the referral,

## 2020-02-11 NOTE — Telephone Encounter (Signed)
I think they will have to find someone on her insurance plan in [place] and I can send referral to that person.

## 2020-02-16 ENCOUNTER — Ambulatory Visit: Payer: Medicare Other | Admitting: Neurology

## 2020-02-26 DIAGNOSIS — E119 Type 2 diabetes mellitus without complications: Secondary | ICD-10-CM | POA: Diagnosis not present

## 2020-02-26 DIAGNOSIS — G309 Alzheimer's disease, unspecified: Secondary | ICD-10-CM | POA: Diagnosis not present

## 2020-02-26 DIAGNOSIS — F419 Anxiety disorder, unspecified: Secondary | ICD-10-CM | POA: Diagnosis not present

## 2020-02-26 DIAGNOSIS — K5900 Constipation, unspecified: Secondary | ICD-10-CM | POA: Diagnosis not present

## 2020-03-28 DIAGNOSIS — M2042 Other hammer toe(s) (acquired), left foot: Secondary | ICD-10-CM | POA: Diagnosis not present

## 2020-03-28 DIAGNOSIS — M2012 Hallux valgus (acquired), left foot: Secondary | ICD-10-CM | POA: Diagnosis not present

## 2020-03-28 DIAGNOSIS — E1141 Type 2 diabetes mellitus with diabetic mononeuropathy: Secondary | ICD-10-CM | POA: Diagnosis not present

## 2020-03-28 DIAGNOSIS — M2011 Hallux valgus (acquired), right foot: Secondary | ICD-10-CM | POA: Diagnosis not present

## 2020-03-28 DIAGNOSIS — B351 Tinea unguium: Secondary | ICD-10-CM | POA: Diagnosis not present

## 2020-03-28 DIAGNOSIS — M2041 Other hammer toe(s) (acquired), right foot: Secondary | ICD-10-CM | POA: Diagnosis not present

## 2020-04-26 DIAGNOSIS — F418 Other specified anxiety disorders: Secondary | ICD-10-CM | POA: Diagnosis not present

## 2020-04-26 DIAGNOSIS — E119 Type 2 diabetes mellitus without complications: Secondary | ICD-10-CM | POA: Diagnosis not present

## 2020-04-26 DIAGNOSIS — E538 Deficiency of other specified B group vitamins: Secondary | ICD-10-CM | POA: Diagnosis not present

## 2020-04-26 DIAGNOSIS — G309 Alzheimer's disease, unspecified: Secondary | ICD-10-CM | POA: Diagnosis not present

## 2020-05-19 DIAGNOSIS — D519 Vitamin B12 deficiency anemia, unspecified: Secondary | ICD-10-CM | POA: Diagnosis not present

## 2020-06-13 ENCOUNTER — Other Ambulatory Visit: Payer: Self-pay | Admitting: Internal Medicine

## 2020-06-13 NOTE — Telephone Encounter (Signed)
    1.Medication Requested:glucose blood (ACCU-CHEK AVIVA PLUS) test strip  2. Pharmacy (Name, Street, City):[facility] [phone] - [place], [place]  3. On Med List:yes   4. Last Visit with PCP: 08/27/19  5. Next visit date with PCP: n/a   Agent: Please be advised that RX refills may take up to 3 business days. We ask that you follow-up with your pharmacy.

## 2020-06-14 MED ORDER — ACCU-CHEK AVIVA PLUS VI STRP: ORAL_STRIP | 5 refills | Status: AC

## 2020-06-17 NOTE — Telephone Encounter (Signed)
Test strips refilled on 06/14/20

## 2020-06-27 DIAGNOSIS — E1149 Type 2 diabetes mellitus with other diabetic neurological complication: Secondary | ICD-10-CM | POA: Diagnosis not present

## 2020-06-27 DIAGNOSIS — B351 Tinea unguium: Secondary | ICD-10-CM | POA: Diagnosis not present

## 2020-07-05 ENCOUNTER — Telehealth: Payer: Self-pay | Admitting: Internal Medicine

## 2020-07-05 MED ORDER — METFORMIN HCL 1000 MG PO TABS: 1000.0000 mg | ORAL_TABLET | Freq: Two times a day (BID) | ORAL | 0 refills | Status: AC

## 2020-07-05 NOTE — Telephone Encounter (Signed)
1.Medication Requested: metFORMIN (GLUCOPHAGE) 1000 MG tablet 2. Pharmacy (Name, [place]): [facility] [phone] - [place], [place] Phone:  [phone]  Fax:  [phone]      3. On Med List: yes 4. Last Visit with PCP: 02.11.21 5. Next visit date with PCP: n/a  Agent: Please be advised that RX refills may take up to 3 business days. We ask that you follow-up with your pharmacy.

## 2020-07-05 NOTE — Telephone Encounter (Signed)
Refill sent. See meds.  

## 2021-03-20 ENCOUNTER — Telehealth: Payer: Self-pay | Admitting: Internal Medicine

## 2021-03-20 NOTE — Telephone Encounter (Signed)
LVM for pt to rtn my call to schedule AWV with [facility]. Please schedule AWV if pt calls the office
